# Patient Record
Sex: Male | Born: 1940 | Race: White | Hispanic: No
Health system: Northeastern US, Academic
[De-identification: ages and names within clinical notes are randomized; demographics above are authoritative.]

---

## 2015-05-13 ENCOUNTER — Ambulatory Visit: Admitting: Internal Medicine

## 2015-07-21 ENCOUNTER — Ambulatory Visit: Admitting: Internal Medicine

## 2015-07-21 ENCOUNTER — Ambulatory Visit

## 2015-07-21 NOTE — Progress Notes (Signed)
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)  HYPERLIPIDEMIA (E78.5)  HYPOTHYROIDISM (E03.9)  TESTOSTERONE DEFICIENCY (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.1)  ERECTILE DYSFUNCTION (N52.9)  GOUT (M10.9)  GERD (K21.9)  ANXIETY (F41.9)  VITILIGO-HANDS (L80)  INSOMNIA, CHRONIC (F51.04)  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)  DERMATITIS, 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)  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)  DEGENERATIVE JOINT DISEASE, KNEES, 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)  MENISCUS TEARS-RT KNEE (S83.209)  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)    Current Medications- Reviewed during today's visit  SYMBICORT 160-4.5 MCG/ACT  AERO (BUDESONIDE-FORMOTEROL FUMARATE): two puffs twice a day for asthma rinse afterwards  SYNTHROID 100 MCG TABS (LEVOTHYROXINE SODIUM): Take one po once daily  NEW HIGHER DOSE  PRAVASTATIN SODIUM 40 MG TABS: Take one po q hs Recheck chol in 2 months  COMPAIR NEBULIZER   MISC (NEBULIZERS): Use with albuterol nebs four times daily  IPRATROPIUM-ALBUTEROL 0.5-2.5 (3) MG/3ML SOLN: use in neb machine four times daily  Asthma  Dx J45.909  CELEBREX 200 MG CAPS (CELECOXIB): Take one po daily DO NOT TAKE IF TAKING INDOCIN  FLUTICASONE PROPIONATE 50 MCG/ACT SUSP: 2 sprays each nostril daily for nasal symptoms  VENTOLIN HFA 108 (90 BASE) MCG/ACT AERS (ALBUTEROL SULFATE): Take two whiffs q 4 hrs as needed for wheezing  ASPIRIN 81 MG TBEC: take one by mouth daily  MIRTAZAPINE 15 MG TABS: take one po at hs as needed for insomnia  LORAZEPAM 1 MG TABS: Take one po two times daily as needed for  anxiety No cash fill  CENTRUM SILVER  TABS (MULTIPLE VITAMINS-MINERALS): Take one po qd  SPIRIVA HANDIHALER 18 MCG CAPS (TIOTROPIUM BROMIDE MONOHYDRATE): 1 cap inhaled once daily for breathing  AZITHROMYCIN 500 MG  TABS: one by mouth daily for ten days for infection  PREDNISONE 20 MG TABS: Take one po two times daily with food    Past Medical History  remotely in three different detox  colonoscopy-2005-diverticulosis and hemorrhoids  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  Surgical History  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  appendectomy age 41  s/p T and A age 64 and age 59  Family History  Father:  died age 62, cancer (oral) and cancer of colon, alcoholic  Mother:  died age 23, cancer of breast, alcoholic  Siblings:  one sister COPD, alcoholic  Social History  Marital Status:  divorced  Children: one son age 6  Lives With: girlfriend [name]  Occupation: retired,   former Company secretary --retired age 48     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 Height: 70.6  in.    BMI: 35.67  BSA:    2    Wt chg:    4  Weight: 248.13 lbs   BMI: 35.13 on 01/22/2015  Temperature: 97.7 deg F.   Pulse rate: 74    Respirations: 12  Pulse Ox (SpO2): 95  BP: 112/70      Medications and Allergies Reviewed          Physical Exam    General:      Weight stable - no insomnia - fevers or night sweats  Head:      normocephalic and atraumatic.    Eyes:      PERRL/EOM intact, conjunctiva and sclera clear with out nystagmus.  wears glasses  varicose vein on the right orbit   some  firmness  cataracts OU--removed by Eye MD   at [facility] doc in [place] also    borderline eye pressures  Ears:      TM's intact and clear with normal canals with grossly normal hearing.    Nose:      no deformity, discharge, inflammation, or lesions.    Mouth:      post nasal drip.      no dental issues--false teeth  Neck:      no masses, thyromegaly, or abnormal cervical nodes.    Chest Wall:      no deformities or breast masses noted.    Lungs:      COPD  barrel chest  poor air movementwheezes on L and wheezes on R.    Heart:      non-displaced PMI, chest non-tender; regular rate and rhythm, S1, S2 without murmurs, rubs, or gallops  Abdomen:       normal bowel sounds; no hepatosplenomegaly no ventral,umbilical hernias or masses noted.    Msk:      generalized OA  Pulses:      pulses normal in all 4 extremities.    Extremities:      bilateral OA in knees with stiffness and crepitus  Neurologic:      no focal deficits, cranial nerves II-XII grossly intact with normal sensation, reflexes, coordination, muscle strength and tone.           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   CXR done in [place] but no access    ..patient started on high dose of prednisone x 10 days...then taper  restarted on azithromycin but 500mg m po once daily x 10 days  may need office followup       New/Revised  Medications Today:   SPIRIVA HANDIHALER 18 MCG CAPS (TIOTROPIUM BROMIDE MONOHYDRATE) 1 cap inhaled once daily for breathing  AZITHROMYCIN 500 MG  TABS (AZITHROMYCIN) one by mouth daily for ten days for infection  PREDNISONE 20 MG TABS (PREDNISONE) Take one po two times daily with food          Prescriptions:  PREDNISONE 20 MG TABS (PREDNISONE)  Take one po  two times daily with food  #20[Tablet] x 1   Entered and Authorized by: [name]   Signed by: [name] on 07/21/2015   Method used: Electronically to      [id number]* (retail)     [address]     [place], [place]  [address]     Ph: [phone]     Fax: [phone]   RxID: [id number]  AZITHROMYCIN 500 MG  TABS (AZITHROMYCIN) one by mouth daily for ten days for infection  #10[Tablet] x 1   Entered and Authorized by: [name]   Signed by: [name] on 07/21/2015   Method used: Electronically to      [id number]* (retail)     [address]     [place], [place]  [address]     Ph: [phone]     Fax: [phone]   RxID: [id number]  MIRTAZAPINE 15 MG TABS (MIRTAZAPINE) take one po at hs as needed for insomnia  #90 x 3   Entered and Authorized by: [name]   Signed by: [name] on 07/21/2015   Method used: Electronically to      [facility]* (mail-order)     [address] [place], [place]  [address]     Ph: [phone]     Fax: [phone]   RxID: [id number]  CELEBREX 200 MG CAPS (CELECOXIB) Take one po daily DO NOT TAKE IF TAKING INDOCIN  #90 x 3   Entered and Authorized by: [name]   Signed by: [name] on 07/21/2015   Method used: Electronically to      [facility]* (mail-order)     [address] [place], [place]  [address]     Ph: [phone]     Fax: [phone]   RxID: [id number]  SPIRIVA HANDIHALER 18 MCG CAPS (TIOTROPIUM BROMIDE MONOHYDRATE) 1 cap inhaled once daily for breathing  #90 x 3   Entered and Authorized by: [name]   Signed by: [name] on 07/21/2015   Method used: Electronically to      [facility]* (mail-order)     [address] [place], [place]  [address]     Ph: [phone]     Fax: [phone]   RxID: [id number]

## 2015-07-21 NOTE — Progress Notes (Signed)
Clinical Lists Changes    Medications:  Changed medication from SPIRIVA HANDIHALER 18 MCG CAPS (TIOTROPIUM BROMIDE MONOHYDRATE) 1 cap inhaled once daily for breathing to INCRUSE ELLIPTA 62.5 MCG/INH AEPB (UMECLIDINIUM BROMIDE) one inhalation once daily for Lungs (COPD) - Signed  Rx of INCRUSE ELLIPTA 62.5 MCG/INH AEPB (UMECLIDINIUM BROMIDE) one inhalation once daily for Lungs (COPD);  #3[Inhaler] x 3;  Signed;  Entered by: [name];  Authorized by: [name];  Method used: Electronically to [facility] [address] [place], [place], [place]  [address], Ph: [phone], Fax: [phone]

## 2015-09-08 ENCOUNTER — Ambulatory Visit

## 2015-09-14 ENCOUNTER — Ambulatory Visit

## 2015-09-21 ENCOUNTER — Ambulatory Visit: Admitting: Internal Medicine

## 2015-10-14 ENCOUNTER — Ambulatory Visit: Admitting: Internal Medicine

## 2015-11-16 ENCOUNTER — Ambulatory Visit: Admitting: Internal Medicine

## 2015-11-25 ENCOUNTER — Ambulatory Visit

## 2016-01-07 ENCOUNTER — Ambulatory Visit: Admitting: Internal Medicine

## 2016-01-13 ENCOUNTER — Ambulatory Visit

## 2016-01-20 ENCOUNTER — Ambulatory Visit

## 2016-01-20 ENCOUNTER — Ambulatory Visit: Admitting: Internal Medicine

## 2016-01-20 LAB — HX COMPREHENSIVE METABOLIC PANELX
HX ALBUMIN: 4.2 g/dL (ref 3.2–4.9)
HX ALKALINE PHOSPHATASE: 78 U/L (ref 30.0–117.0)
HX ALT: 30 U/L (ref 0.0–40.0)
HX ANION GAP: 13 mmol/L (ref 9.0–19.0)
HX AST: 27 U/L (ref 0.0–37.0)
HX BICARBONATE: 27 mmol/L (ref 23.0–31.0)
HX BUN/CREAT RATIO: 14 (ref 12.0–20.0)
HX BUN: 15 mg/dL (ref 8.0–23.0)
HX CALCIUM: 9.2 mg/dL (ref 8.5–10.5)
HX CHLORIDE: 102 mmol/L (ref 98.0–110.0)
HX CREATININE: 1 mg/dL (ref 0.4–1.2)
HX GLOMERULAR FILTRATION RATE: 73
HX GLUCOSE: 94 mg/dL (ref 70.0–100.0)
HX HEMOLYSIS INDEX: 13 mg/dL (ref 0.0–50.0)
HX ICTERIC INDEX: 1 (ref 0.0–2.0)
HX LIPEMIC INDEX: 9 mg/dL (ref 0.0–40.0)
HX POTASSIUM: 4.5 mmol/L (ref 3.6–5.3)
HX SODIUM: 142 mmol/L (ref 137.0–146.0)
HX TOTAL BILIRUBIN: 0.6 mg/dL (ref 0.2–1.2)
HX TOTAL PROTEIN: 6.8 g/dL (ref 6.5–8.4)

## 2016-01-20 LAB — HX CBC W/DIFFX
HX ABSOLUTE BASOPHILS COUNT: 0.03 10*3/uL (ref 0.0–0.33)
HX ABSOLUTE EOSINOPHIL COUNT: 0.63 10*3/uL (ref 0.0–0.88)
HX ABSOLUTE LYMPHS COUNT: 1.64 10*3/uL (ref 0.99–4.84)
HX ABSOLUTE MONOCYTES COUNT: 0.59 10*3/uL (ref 0.18–1.21)
HX ABSOLUTE NEUTROPHIL CNT: 3.15 10*3/uL (ref 1.8–7.7)
HX BASOS: 0.5 %
HX EOS: 10.4 %
HX HEMATOCRIT: 43 % (ref 41.0–53.0)
HX HEMOGLOBIN: 14.6 g/dL (ref 13.5–17.5)
HX IMMATURE GRANULOCYTES: 0.2 % (ref 0.0–1.0)
HX LYMPHS: 27.1 %
HX MEAN CORP.HEMO.CONC.: 34 g/dL (ref 31.0–37.0)
HX MEAN CORPUSCULAR HEMOGLOBIN: 29.8 pg (ref 26.0–34.0)
HX MEAN CORPUSCULAR VOLUME: 87.8 fL (ref 80.0–100.0)
HX MEAN PLATELET VOLUME: 10.7 fL (ref 9.4–12.4)
HX MONOS: 9.8 %
HX PLATELET COUNT: 167 10*3/uL (ref 150.0–400.0)
HX POLYS: 52
HX RED BLOOD COUNT: 4.9 M/uL (ref 4.5–5.9)
HX RED CELL DISTRIBUTION WIDTH SD: 41.7 fL (ref 35.0–51.0)
HX WHITE BLOOD COUNT: 6.1 10*3/uL (ref 4.5–11.0)

## 2016-01-20 LAB — HX ROUT.URINE WITH MICROSCOPIC
HX ASCORBIC ACID URINE: NEGATIVE
HX URINE BACTERIA: NONE SEEN
HX URINE BILE: NEGATIVE
HX URINE BLOOD: NEGATIVE
HX URINE ESTERASE: NEGATIVE
HX URINE GLUCOSE: NEGATIVE
HX URINE KETONES: NEGATIVE
HX URINE NITRITE: NEGATIVE
HX URINE PH: 5 (ref 5.0–8.0)
HX URINE PROTEIN: NEGATIVE
HX URINE SPECIFIC GRAVITY: 1.013 (ref 1.003–1.03)
HX URINE SQUAMOUS EPITHELIALX: NONE SEEN
HX UROBILINOGEN, URINE: NEGATIVE

## 2016-01-20 LAB — HX GLYCOHEMOGLOBIN
HX ESTIMATED AVERAGE GLUCOSE: 117 mg/dL
HX GLYCOHEMOGLOBIN EQUIVALENT: 0.672
HX HEMOGLOBIN A1C: 5.7 % (ref 4.2–5.8)

## 2016-01-20 LAB — HX VITAMIN D,25 HYDROXY: HX VITAMIN D,25 HYDROXY: 42.6 ng/mL (ref >=30)

## 2016-01-20 LAB — HX MICROALBUMIN (RANDOM URINE)
HX MICROALBUMIN (RANDOM URINE): <12 (ref 0.0–20.0)
HX URINE CREATININE (RANDOM): 77.9 mg/dL (ref 22.0–328.0)

## 2016-01-20 LAB — HX LIPID PANEL FASTINGX
HX CHD RISK ASSESMENT FACTORX: 3.3
HX CHOLESTEROL (LIPR): 169 mg/dL (ref <200)
HX HDL CHOLESTEROLX: 51 mg/dL (ref 35.0–55.0)
HX LDL CHOLESTEROLX: 100 mg/dL (ref <130)
HX NON HDL CHOLESTEROLX: 118 mg/dL (ref <130)
HX TRIGLYCERIDES: 91 mg/dL (ref <150)

## 2016-01-20 LAB — HX FERRITIN: HX FERRITIN: 56 ng/mL (ref 30.0–400.0)

## 2016-01-20 LAB — HX VITAMIN B12
HX HEMOLYSIS INDEX: 14 mg/dL (ref 0.0–50.0)
HX ICTERIC INDEX: 1 (ref 0.0–2.0)
HX LIPEMIC INDEX: 11 mg/dL (ref 0.0–40.0)
HX VITAMIN B12: 305 pg/mL (ref 240.0–900.0)

## 2016-01-20 LAB — HX PROSTATE SPECIFIC ANTIGEN SCR: HX PROSTATE SPECIFIC ANTIGEN SCR: 0.74 ng/mL (ref <4.00)

## 2016-01-20 LAB — HX TSH WITH REFLEX: HX TSH WITH REFLEX: 2.49 u[IU]/mL (ref 0.27–4.2)

## 2016-01-20 LAB — HX DIRECT BILIRUBIN: HX DIRECT BILIRUBIN: <0.2 (ref 0.0–0.3)

## 2016-01-20 NOTE — Progress Notes (Signed)
Medications Prior to this Visit  SYMBICORT 160-4.5 MCG/ACT INHALATION AEROSOL (BUDESONIDE-FORMOTEROL FUMARATE) two puffs twice a day for asthma rinse afterwards  SYNTHROID 100 MCG ORAL TABLET (LEVOTHYROXINE SODIUM) Take one po once daily  NEW HIGHER DOSE  PRAVASTATIN SODIUM 40 MG ORAL TABLET (PRAVASTATIN SODIUM) Take one po q hs Recheck chol in 2 months  COMPAIR NEBULIZER (NEBULIZERS) Use with albuterol nebs four times daily  IPRATROPIUM-ALBUTEROL 0.5-2.5 (3) MG/3ML INHALATION SOLUTION (IPRATROPIUM-ALBUTEROL) use in neb machine four times daily  Asthma  Dx J45.909  CELEBREX 200 MG ORAL CAPSULE (CELECOXIB) Take one po daily DO NOT TAKE IF TAKING INDOCIN  FLUTICASONE PROPIONATE 50 MCG/ACT NASAL SUSPENSION (FLUTICASONE PROPIONATE) 2 sprays each nostril daily for nasal symptoms  VENTOLIN HFA 108 (90 Base) MCG/ACT INHALATION AEROSOL SOLUTI (ALBUTEROL SULFATE) Take two whiffs q 4 hrs as needed for wheezing  ASPIRIN 81 MG ORAL TABLET DELAYED RELEASE (ASPIRIN) take one by mouth daily  MIRTAZAPINE 15 MG ORAL TABLET (MIRTAZAPINE) take one po at hs as needed for insomnia  LORAZEPAM 1 MG ORAL TABLET (LORAZEPAM) Take one po two times daily as needed for anxiety No cash fill  CENTRUM SILVER ORAL TABLET (MULTIPLE VITAMINS-MINERALS) Take one po qd  INCRUSE ELLIPTA 62.5 MCG/INH INHALATION AEROSOL POWDER BREAT (UMECLIDINIUM BROMIDE) one inhalation once daily for Lungs (COPD)  PREDNISONE 20 MG ORAL TABLET (PREDNISONE) Take one po two times daily with food    Current Problems- Reviewed during today's visit  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)  COLONIC POLYPS (No ICD-10)  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)  ASTHMA, 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)  CARCINOMA, 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)  ALCOHOL ABUSE, 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)  HYPERLIPIDEMIA (E78.5)  HYPOTHYROIDISM (E03.9)  TESTOSTERONE DEFICIENCY (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.1)  ERECTILE DYSFUNCTION (N52.9)  GOUT (M10.9)  GERD (K21.9)  ANXIETY (F41.9)  VITILIGO-HANDS (L80)  INSOMNIA, CHRONIC (F51.04)  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)  DERMATITIS,  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)  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)  DEGENERATIVE JOINT DISEASE, KNEES, 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)  MENISCUS TEARS-RT KNEE (S83.209)  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)    Current Medications- Reviewed during today's visit  SYMBICORT 160-4.5 MCG/ACT INHALATION AEROSOL (BUDESONIDE-FORMOTEROL FUMARATE): two puffs twice a day for asthma rinse afterwards  SYNTHROID 100 MCG ORAL TABLET (LEVOTHYROXINE SODIUM): Take one po once daily  NEW HIGHER DOSE  PRAVASTATIN SODIUM 40 MG ORAL TABLET: Take one po q hs Recheck chol in 2 months  COMPAIR NEBULIZER (NEBULIZERS): Use with albuterol nebs four times daily  IPRATROPIUM-ALBUTEROL 0.5-2.5 (3) MG/3ML INHALATION SOLUTION: use in neb machine four times daily  Asthma  Dx J45.909  CELEBREX 200 MG ORAL CAPSULE (CELECOXIB): Take one po daily DO NOT TAKE IF TAKING INDOCIN  FLUTICASONE PROPIONATE 50 MCG/ACT NASAL SUSPENSION: 2 sprays each nostril daily for nasal symptoms  VENTOLIN HFA 108 (90 Base) MCG/ACT INHALATION AEROSOL SOLUTI (ALBUTEROL SULFATE): Take two whiffs q 4 hrs as needed for wheezing  ASPIRIN 81 MG ORAL TABLET DELAYED RELEASE: take one by mouth daily  MIRTAZAPINE 15 MG ORAL TABLET: take one po at hs as needed for insomnia  LORAZEPAM 1 MG ORAL TABLET: Take one po two times daily as needed for anxiety No cash fill  CENTRUM SILVER ORAL TABLET (MULTIPLE VITAMINS-MINERALS): Take one po qd  INCRUSE ELLIPTA 62.5 MCG/INH INHALATION AEROSOL POWDER BREAT (UMECLIDINIUM BROMIDE): one inhalation once daily for Lungs (COPD)  PREDNISONE 20 MG ORAL TABLET: Take one po two times daily with food    Current Directives- Reviewed during today's visit  HEALTH CARE PROXY  DISCUSED WITH PATIENT -- FULL CODE    Past Medical History  remotely in three  different detox  colonoscopy-2005-diverticulosis and hemorrhoids  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  Surgical History  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  appendectomy age 68  s/p T and A age 48 and age 28  Family  History  Father:  died age 59, cancer (oral) and cancer of colon, alcoholic  Mother:  died age 26, cancer of breast, alcoholic  Siblings:  one sister COPD, alcoholic  Social History  Marital Status:  divorced  Children: one son age 90  Lives With: girlfriend [name]  Occupation: retired,   former Company secretary --retired age 21     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   Eyes: Denies visual change or blurring, eye pain.   Ears/Nose/Throat: Denies earache, decreased hearing, difficulty swallowing.   Cardiovascular: Denies chest pain or pressure, palpitations, shortness of breath.   Respiratory: Denies dry cough, productive cough, shortness of breath, wheezing.   Gastrointestinal: Denies acid indigestion, nausea, vomiting, diarrhea, abdominal pain, change in bowel habits, constipation, mucous or blood in stools.   Musculoskeletal: Denies muscle cramps or aches, muscle weakness, morning stiffness, joint pain, joint swelling.   Skin: Denies dry skin, rash, skin ulcers, suspicious lesions, hx of skin cancer.   Psychiatric: Denies anxiety, depression, insomnia.     Vital Signs     Weight: 258 lb. Height: 70.6  in.    BMI: 36.52  BSA: 2.34    Wt chg: 6  Weight: 252 lbs   BMI: 35.67 on 07/21/2015  Temperature: 97.7 deg F.   Pulse rate: 62    Respirations: 12  Pulse Ox (SpO2): 97  BP: 126/80      Medications and Allergies Reviewed          Physical Exam    General:      Weight stable - no insomnia -  fevers or night sweats  Head:      normocephalic and atraumatic.    Eyes:      PERRL/EOM intact, conjunctiva and sclera clear with out nystagmus.  wears glasses  varicose vein on the right orbit   some firmness  cataracts OU--removed by Eye MD   at [facility] doc in [place] also    borderline eye pressures  Ears:      TM's intact and clear with normal canals with grossly normal hearing.    Nose:      no deformity, discharge, inflammation, or lesions.    Mouth:      post nasal drip.      no dental issues--false teeth  Neck:      no masses, thyromegaly, or abnormal cervical nodes.    Chest Wall:      no deformities or breast masses noted.    Lungs:      COPD  barrel chest  Heart:      non-displaced PMI, chest non-tender; regular rate and rhythm, S1, S2 without murmurs, rubs, or gallops  Abdomen:       normal bowel sounds; no hepatosplenomegaly no ventral,umbilical hernias or masses noted.    Prostate:      PSA screen  Msk:      generalized OA  Pulses:      pulses normal in all 4 extremities.    Extremities:      bilateral OA in knees with stiffness and crepitus  Neurologic:      no focal deficits, cranial nerves II-XII grossly intact with normal sensation, reflexes, coordination, muscle strength and tone.           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   test TSH     No over the counter medications.   Med Compliance and SE's: Pt is compliant with meds with no side effects               Prescriptions:  SYNTHROID 100 MCG ORAL TABLET (LEVOTHYROXINE SODIUM) Take one po once daily  NEW HIGHER DOSE  #90[Unspecified] x 3   Entered and Authorized by: [name]  MD   Signed by: [name] on 01/20/2016   Method used: Electronically to      [facility]* (mail-order)     [address] [place], [place]  [address]     Ph: [phone]     Fax: [phone]   RxID: [id number]

## 2016-01-20 NOTE — Progress Notes (Signed)
Receipt of: Gen Update    The following were sent to Orland Penman' at [email or web address] on 01/20/2016 4:06:16 PM:     - Secure message created from ACM template     - Attachment created from ACM template

## 2016-02-16 ENCOUNTER — Ambulatory Visit

## 2016-04-18 ENCOUNTER — Ambulatory Visit

## 2016-04-27 ENCOUNTER — Ambulatory Visit

## 2016-05-10 ENCOUNTER — Ambulatory Visit

## 2016-05-25 ENCOUNTER — Ambulatory Visit

## 2016-05-25 NOTE — Progress Notes (Signed)
Problem List Changes:  Added new problem of MORBID OBESITY (ICD-278.01) (ICD10-E66.01)  Added new problem of BMI 36-36.9 ADULT (ICD-V85.36) (ZOX09-U04.54)    By signing my name below, I, Rich Number, attest that this documentation has been prepared under the direction and in the presence of Sarrinah Ahmed.   Electronically Signed: (May 25, 2016 10:17 AM)

## 2016-07-14 ENCOUNTER — Ambulatory Visit

## 2016-07-14 ENCOUNTER — Ambulatory Visit: Admitting: Internal Medicine

## 2016-07-14 LAB — HX GLYCOHEMOGLOBIN
HX ESTIMATED AVERAGE GLUCOSE: 123 mg/dL
HX GLYCOHEMOGLOBIN EQUIVALENT: 0.684
HX HEMOGLOBIN A1C: 5.9 % — ABNORMAL HIGH (ref 4.2–5.8)

## 2016-07-14 LAB — HX COMPREHENSIVE METABOLIC PANELX
HX ALBUMIN: 4.3 g/dL (ref 3.2–4.9)
HX ALKALINE PHOSPHATASE: 77 U/L (ref 30.0–117.0)
HX ALT: 25 U/L (ref 0.0–40.0)
HX ANION GAP: 15 mmol/L (ref 9.0–19.0)
HX AST: 26 U/L (ref 0.0–37.0)
HX BICARBONATE: 25 mmol/L (ref 23.0–31.0)
HX BUN/CREAT RATIO: 16 (ref 12.0–20.0)
HX BUN: 16 mg/dL (ref 8.0–23.0)
HX CALCIUM: 9.1 mg/dL (ref 8.5–10.5)
HX CHLORIDE: 101 mmol/L (ref 98.0–110.0)
HX CREATININE: 1 mg/dL (ref 0.4–1.2)
HX GLOMERULAR FILTRATION RATE: 73
HX GLUCOSE: 75 mg/dL (ref 70.0–100.0)
HX HEMOLYSIS INDEX: 6 mg/dL (ref 0.0–50.0)
HX ICTERIC INDEX: 1 (ref 0.0–2.0)
HX LIPEMIC INDEX: 7 mg/dL (ref 0.0–40.0)
HX POTASSIUM: 4.5 mmol/L (ref 3.5–5.1)
HX SODIUM: 141 mmol/L (ref 137.0–146.0)
HX TOTAL BILIRUBIN: 0.6 mg/dL (ref 0.2–1.2)
HX TOTAL PROTEIN: 6.4 g/dL — ABNORMAL LOW (ref 6.5–8.4)

## 2016-07-14 LAB — HX TSH WITH REFLEX: HX TSH WITH REFLEX: 2.77 u[IU]/mL (ref 0.27–4.2)

## 2016-07-14 LAB — HX  COMPLETE BLOOD COUNT
HX HEMATOCRIT: 43.1 % (ref 41.0–53.0)
HX HEMOGLOBIN: 14.2 g/dL (ref 13.5–17.5)
HX MEAN CORP.HEMO.CONC.: 32.9 g/dL (ref 31.0–37.0)
HX MEAN CORPUSCULAR HEMOGLOBIN: 29.3 pg (ref 26.0–34.0)
HX MEAN CORPUSCULAR VOLUME: 88.9 fL (ref 80.0–100.0)
HX MEAN PLATELET VOLUME: 11.4 fL (ref 9.4–12.4)
HX PLATELET COUNT: 178 10*3/uL (ref 150.0–400.0)
HX RED BLOOD COUNT: 4.9 M/uL (ref 4.5–5.9)
HX RED CELL DISTRIBUTION WIDTH SD: 42.5 fL (ref 35.0–51.0)
HX WHITE BLOOD COUNT: 6.1 10*3/uL (ref 4.5–11.0)

## 2016-07-14 NOTE — Progress Notes (Signed)
Visit Type:  Acute Visit  Primary Provider:  [name]    CC:  Fatigue and SOB.    History of Present Illness:  Justin Navarro is a 76 Year Old Male who presents today for: generalized fatigue  Specialists seen since last visit? no  Has previsit planning and a huddle been performed on this patient? no     No tobacco product use     WEAKNESS/FATIGUE:  -Returned from [place] 6-7 weeks ago  -Since has been feeling weak and fatigued  -He is sleeping well.      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  ALCOHOL DEPENDENCE IN REMISSION  HYPERLIPIDEMIA  HYPOTHYROIDISM  TESTOSTERONE DEFICIENCY  ERECTILE DYSFUNCTION  GOUT  GERD  ANXIETY  VITILIGO-HANDS  COLON CANCER-FATHER  LUMBAR RADICULOPATHY    Current Medications- Reviewed during today's visit  ASPIRIN 81 MG ORAL TABLET DELAYED RELEASE: take one by mouth daily  SYMBICORT 160-4.5 MCG/ACT INHALATION AEROSOL (BUDESONIDE-FORMOTEROL FUMARATE): two puffs twice a day for asthma rinse afterwards  SYNTHROID 100 MCG ORAL TABLET (LEVOTHYROXINE SODIUM): Take one po once daily  NEW HIGHER DOSE  PRAVASTATIN SODIUM 40 MG ORAL TABLET: Take one po q hs Recheck chol in 2 months  COMPAIR NEBULIZER (NEBULIZERS): Use with albuterol nebs four times daily  IPRATROPIUM-ALBUTEROL 0.5-2.5 (3) MG/3ML INHALATION SOLUTION: use in neb machine four times daily  Asthma  Dx J45.909  CELEBREX 200 MG ORAL CAPSULE (CELECOXIB): Take one po daily DO NOT TAKE IF TAKING INDOCIN  FLUTICASONE PROPIONATE 50 MCG/ACT NASAL SUSPENSION: 2 sprays each nostril daily for nasal symptoms  VENTOLIN HFA 108 (90 Base) MCG/ACT INHALATION AEROSOL SOLUTI (ALBUTEROL SULFATE): Take two whiffs q 4 hrs as needed for wheezing  MIRTAZAPINE 15 MG ORAL TABLET: take one po at  hs as needed for insomnia  LORAZEPAM 1 MG ORAL TABLET: Take one po two times daily as needed for anxiety No cash fill  PREDNISONE 20 MG ORAL TABLET: Take one po two times daily with food  SPIRIVA HANDIHALER 18 MCG INHALATION CAPSULE (TIOTROPIUM BROMIDE MONOHYDRATE): 1 inhalation daily for copd    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  Surgical History  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  appendectomy age 89  s/p T and A age 3 and age 89  Family History  Father:  died age 65, cancer (oral) and cancer of colon, alcoholic  Mother:  died age 16, cancer of breast, alcoholic  Siblings:  one sister COPD, alcoholic  Social History  Marital Status:  divorced  Children: one son age 29  Lives With: girlfriend [name]  Occupation: retired,   former Company secretary --retired age 46     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      Risk Factors  Tobacco User:no  Smoking Status:former smoker  Year quit: 1998  Pack-years: 40  Smoking Comments:  started smoking age [age]  Passive smoke exposure: No    Drug use: no  Alcohol use: no    Exercise: Yes  Times per week: 3  Exercise Comments:  walking and golf    Caffeine (drinks/day): 2  Sun exposure: rarely  Seatbelt use (%): 100  Family History MI in male age < 40: no  Family History MI in male age < 52: no    Falls Information:  Past year - no        Review of Systems   General: Complains of fatigue, weakness.   Cardiovascular: Complains of shortness of breath.   Respiratory: Complains of shortness of breath.   Neurologic: Complains of general weakness.     Vital Signs     Weight: 254 lb. Height: 70.6  in.    BMI: 35.96  BSA:    2    Wt chg: -4  Weight: 258 lbs   BMI: 36.52 on 01/20/2016  Temperature: 97.7 deg F.     Temp Site: oral    Pulse rate:  67  On Oxygen? No  Pulse Ox (SpO2): 94 BP: 126/78 - normal cuff, right arm      Patient is not experiencing pain    Medications and Allergies Reviewed    Signed: Ardeen Fillers [place]....July 14, 2016 5:19 PM  PHQ 2    Over the last 2 weeks, how often have you been bothered by any of the following problems?  1. Little interest or pleasure in doing things:  1   - Several days  2. Feeling down, depressed, or hopeless:  0   - Not at all        Physical Exam    General:      well developed, well nourished, in no acute distress.    Head:      normocephalic and atraumatic.    Eyes:      PERRL/EOM intact, conjunctiva and sclera clear with out nystagmus.    Ears:      TM's intact and clear with normal canals with grossly normal hearing.    Lungs:      diffuse exp wheezing bilat without rhonchi or rales. fair air movement.   Heart:      non-displaced PMI, chest non-tender; regular rate and rhythm, S1, S2 without murmurs, rubs, or gallops  Pulses:      pulses normal in all 4 extremities.    Extremities:      No edema      Orders:   Added new Service order of Patient Encounter ([id number]) - Signed  Added new Test order of LABS DRAWN/SPECIMEN COLLECTED IN OFFICE [facility]) - Signed  Added new Test order of CBC -CBC Only (No Diff)** (CBCO) - Signed  Added new Test order of COMMP -Comp. Metabolic Panel (CMP) - Signed  Added new Test order of TSHR -TSHR with Reflex** (TSHR) - Signed  Added new Test order of GLYCO - A1C** (GLYCO.) - Signed  Added new Test order of Chest P/A & Lateral (CHEST) - Signed  Added new Test order of PFT-Pulmonary Function Test (PFT) - Signed  Added new Service order of OV Est Level IV ([id number]) - Signed  Added new Service order of Venipuncture ([id number]) - Signed       Assessment and Plan:      ~ COPD-MOD/asthma : I think this is the major cause of his symptoms. He has not been using the incruse because he thought it ineffective. he is using the symbicort. will add spiriva. will get cxr to rule out heart  failure and pneumonia.   Will send in order for lung function testing  and he will follow up in one month.      ~ FATIGUE, WEAKNESS:   Will check labs to rule out anemia, underactive thyroid, etc.          ~ MORBID OBESITY (E66.01)   BMI 36-36.9 ADULT (Z68.36) with comorbidities of Osteoarthritis and hyperlipidemia.   he wants to lose weight and plans to work on diet. he will return for nutrition counseling with our NP Clydie Braun.    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       Medications Removed Today:   CENTRUM SILVER ORAL TABLET (MULTIPLE VITAMINS-MINERALS) Take one po qd  INCRUSE ELLIPTA 62.5 MCG/INH INHALATION AEROSOL POWDER BREAT (UMECLIDINIUM BROMIDE) one inhalation once daily for Lungs (COPD)    New/Revised  Medications Today:   SPIRIVA HANDIHALER 18 MCG INHALATION CAPSULE (TIOTROPIUM BROMIDE MONOHYDRATE) 1 inhalation daily for copd            Patient Instructions    I am going to check you lab work today.  *  I have ordered a chest x-ray for you as well as a lung function test.   *  Please start Spiriva daily in addition to the Symbicort  *  Follow up as scheduled with Dr. Dagoberto Reef.        Prescriptions:  SPIRIVA HANDIHALER 18 MCG INHALATION CAPSULE (TIOTROPIUM BROMIDE MONOHYDRATE) 1 inhalation daily for copd  #90[Capsule] x 3   Entered and Authorized by: Caffie Damme, M.D.   Signed by: Caffie Damme, M.D. on 07/14/2016   Method used: Electronically to      [facility] ([facility]order)     [address] [place], [place]  [address]     Ph: [phone]     Fax: [phone]   RxID: [id number]            By signing my name below, I, Briana C. Lass, attest that this documentation has been prepared under the direction and in the presence of Caffie Damme, M.D..   Electronically Signed: (July 14, 2016 5:31 PM)  I entered the room at this time .Marland KitchenMarland KitchenBriana C. Lass  July 14, 2016 5:30 PM.  I exited the room at this time .Marland KitchenMarland KitchenBriana C. Lass  July 14, 2016 5:40 PM.  Patient  Portal Chart Access PIN     The included PIN will be valid until August 13, 2016  Your PIN ID: [id number]    Patient was provided instructions via a Printed Handout.  GeorgeBryer)  - PIN:([id number])    Contact us if you have any questions. We look forward to connecting with you online through the secure Patient Portal.    Sincerely,    [facility] - [address]

## 2016-07-15 ENCOUNTER — Ambulatory Visit: Admitting: Internal Medicine

## 2016-07-15 ENCOUNTER — Ambulatory Visit

## 2016-07-15 NOTE — Progress Notes (Signed)
[facility] - [address]   [address]   [place], [place] [address]  Office: [phone] Fax: [phone]     Mathius Birkeland (05-11-1940)       Printed: July 15, 2016    July 15, 2016      Sven Pinheiro  [address]  [place], [place]  [address]    Dear [name],    I have received the results of your most recent labwork. The results are listed below:     Labs Your Value Normal Result Date   Blood sugar 75 Normal : 70-100 07/14/2016   Hemoglobin A1C   (3 month sugar test) 5.9 Normal: 4.2  5.8 07/14/2016   Estimated Average Glucose  (3 month Average) 123 Goal: less than 150 07/14/2016   Creatinine (kidney function) 1.0 Normal: 0.4  1.2   07/14/2016   ALT (liver test) 25 Normal male:0-40   07/14/2016   AST (liver test) 26 Normal male: 0-37 07/14/2016   Hematocrit (blood count)   43.1 Normal male: 40-52 07/14/2016   TSH (ultra thyroid test) 2.77 Normal: 0.27  4.20 07/14/2016         Sincerely,  Caffie Damme, M.D.

## 2016-07-15 NOTE — Telephone Encounter (Signed)
Phone Note -     Prior Authorization    Initial call taken by: Caffie Damme, M.D.,  July 15, 2016 2:33 PM  Summary of Call: Drug Name: spiriva  [facility]  Past drugs used or other pertinent information: patient as copd/asthma not controlled on symbicort. has tried incruse without benefit.   Provider:  [place]      Follow-up #1  Details: submitted  By: Theodora Blow ~ July 17, 2016 12:21 PM    Details: APPROVED  pharmacy / patient notified  By: Theodora Blow ~ July 17, 2016 12:56 PM

## 2016-07-15 NOTE — Telephone Encounter (Signed)
Phone Note -     Outgoing Call    Initial call taken by: Caffie Damme, M.D.,  July 15, 2016 9:38 AM  Summary of Call: last glycohemoglobin was 5.9 on 07/14/2016. this is consistent with prediabetes  he will return for nutrition teaching and follow up to copd with lynn in July.           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

## 2016-07-16 ENCOUNTER — Ambulatory Visit

## 2016-08-09 ENCOUNTER — Ambulatory Visit: Admitting: Internal Medicine

## 2016-08-09 ENCOUNTER — Ambulatory Visit

## 2016-08-11 ENCOUNTER — Ambulatory Visit: Admitting: Internal Medicine

## 2016-08-11 NOTE — Telephone Encounter (Signed)
Phone Note -     Outgoing Call    Initial call taken by: Caffie Damme, M.D.,  August 11, 2016 9:41 AM  Summary of Call: moderate copd on pfts. he can discuss further with Clydie Braun at his appointment.       Follow-up #1  Details: Call to patient- mailbox is full and cannot accept messages at this time.   By: Joannie Springs RN ~ August 11, 2016 9:55 AM    Details: Call from Pt- review Information from Dr Murlean Caller- verbally understood.  Will discuss further with L. Jumper at appt Monday   By: Joannie Springs RN ~ August 11, 2016 10:41 AM

## 2016-08-13 ENCOUNTER — Ambulatory Visit

## 2016-08-14 ENCOUNTER — Ambulatory Visit

## 2016-08-14 ENCOUNTER — Ambulatory Visit: Admitting: Adolescent Medicine

## 2016-08-14 LAB — HX COMPREHENSIVE METABOLIC PANELX
HX ALBUMIN: 4.1 g/dL (ref 3.2–4.9)
HX ALKALINE PHOSPHATASE: 78 U/L (ref 30.0–117.0)
HX ALT: 22 U/L (ref 0.0–40.0)
HX ANION GAP: 14 mmol/L (ref 9.0–19.0)
HX AST: 24 U/L (ref 0.0–37.0)
HX BICARBONATE: 22 mmol/L — ABNORMAL LOW (ref 23.0–31.0)
HX BUN/CREAT RATIO: 14 (ref 12.0–20.0)
HX BUN: 15 mg/dL (ref 8.0–23.0)
HX CALCIUM: 8.9 mg/dL (ref 8.5–10.5)
HX CHLORIDE: 104 mmol/L (ref 98.0–110.0)
HX CREATININE: 1.1 mg/dL (ref 0.4–1.2)
HX GLOMERULAR FILTRATION RATE: 65
HX GLUCOSE: 90 mg/dL (ref 70.0–100.0)
HX HEMOLYSIS INDEX: 20 mg/dL (ref 0.0–50.0)
HX ICTERIC INDEX: 1 (ref 0.0–2.0)
HX LIPEMIC INDEX: 14 mg/dL (ref 0.0–40.0)
HX POTASSIUM: 4.3 mmol/L (ref 3.5–5.1)
HX SODIUM: 140 mmol/L (ref 137.0–146.0)
HX TOTAL BILIRUBIN: 0.5 mg/dL (ref 0.2–1.2)
HX TOTAL PROTEIN: 6.6 g/dL (ref 6.5–8.4)

## 2016-08-14 NOTE — Progress Notes (Signed)
Primary Provider:  [name]      History of Present Illness:  Justin Navarro is a 76 Year Old Male who presents today ZOX:WRUEAVWUJWJ,XBJYNWGNFAO counseling/ teaching.He states he is a picky eater.Marland Kitchen"I only like sweets or soft foods". He states he eggs often for they are soft and a good source of protein. He has a h/o mouth cancer       Specialists seen since last visit? no  Has previsit planning and a huddle been performed on this patient? 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    Current Medications- Reviewed during today's visit  ASPIRIN 81 MG ORAL TABLET DELAYED RELEASE: take one by mouth daily  SYMBICORT 160-4.5 MCG/ACT INHALATION AEROSOL (BUDESONIDE-FORMOTEROL FUMARATE): two puffs twice a day for asthma rinse afterwards  SYNTHROID 100 MCG ORAL TABLET (LEVOTHYROXINE SODIUM): Take one po once daily  NEW HIGHER DOSE  PRAVASTATIN SODIUM 40 MG ORAL TABLET: Take one po q hs Recheck chol in 2 months  COMPAIR NEBULIZER (NEBULIZERS): Use with albuterol nebs four times daily  IPRATROPIUM-ALBUTEROL 0.5-2.5 (3) MG/3ML INHALATION SOLUTION: use in neb machine four times daily  Asthma  Dx J45.909  CELEBREX 200 MG ORAL CAPSULE (CELECOXIB): Take one po daily DO NOT TAKE IF TAKING INDOCIN  FLUTICASONE PROPIONATE 50 MCG/ACT NASAL SUSPENSION: 2 sprays each nostril daily for nasal symptoms  VENTOLIN HFA 108 (90 Base) MCG/ACT INHALATION AEROSOL SOLUTION (ALBUTEROL SULFATE): Take two whiffs q 4 hrs as needed for wheezing  MIRTAZAPINE 15 MG ORAL TABLET: take one po at hs as needed for insomnia  LORAZEPAM 1 MG ORAL TABLET: Take one po two times daily as needed for anxiety No cash  fill  PREDNISONE 20 MG ORAL TABLET: Take one po two times daily with food  SPIRIVA HANDIHALER 18 MCG INHALATION CAPSULE (TIOTROPIUM BROMIDE MONOHYDRATE): 1 inhalation daily for copd    Current Allergies- Reviewed during today's visit  PENICILLIN (Critical)  * CT CONTRAST (Critical)  * BELSOMRA (Mild)    Bladder Control  issues: no  Safety concerns: no    Falls Information:  Past 6 months - no  Past year - yes        ROS:  See HPI  All other pertinent systems reviewed and are negative.  Vital Signs     Patient: 77 Years Old Male  Height:  70.6 in.  Weight: 254 lbs        BP:  110/60 right arm, large cuff, seated     126/78 on 07/14/2016   Temp:  97.7  F    oral  Pulse:  63         Pulse Ox: 94 %  On Oxygen: No    Patient is not experiencing pain    Medications and Allergies Reviewed    Signed: Ardeen Fillers [place]....August 14, 2016 11:21 AM    PHQ 2    Over the last 2 weeks, how often have you been bothered by any of the following problems?  1. Little interest or pleasure in doing things:  0   - Not at all  2. Feeling down, depressed, or hopeless:  0   - Not at  all      Lipid Panel   Cholesterol: 165  HDL: 48  LDL: 86  Triglycerides: 155  Cholesterol/HDL Ratio: 3.5    Pulse Ox   Result: 94    Signed By: Morton Stall [place], August 14, 2016 11:40 AM      GEN: No acute distress  PSYCH:alert,normal affect and attention span.Normal concentration  HEENT: MM's normal,PERRLA,conjunctiva pink  LUNGS: Clear   HEART: RRR,no MRG.PMI ND.  ZOX:WRUE,AVW tender.Bowel sounds present.No hepatosplenomegally  EXT: no edema  SKIN: Intact.No discoloration       Assessment and Plan:     ~ PREDIABETES (R73.03)   MORBID OBESITY (E66.01)   BMI 36-36.9 ADULT (Z68.36):  count carbs every day:  15 to 30 carbs for breakfast  30 carbs...at the most for lunch  30 to 45 carbs at supper  3 small snacks a day... no more than 15 per snack  never skip meals  eat every 5 hours  never eat a carbohydrate without eating it with a protein,fiber and a small amount  of fat...(apple with cheese or peanut butter)  Do everything: SMART  S:specific  M:measureable  A:achievable  R:realistic  T: test of time    make plate of food as usual.before eating,measure each item.eat your meal.later look up the normal serving size.    next week:...eat to the "wow"...stop when fullfilled not full...stop when satisfied bot stuffed       ~ HYPERLIPIDEMIA (E78.5) :    condition remains stable  Cholesterol: 165  HDL: 48  LDL: 86  Triglycerides: 155  Cholesterol/HDL Ratio: 3.5  continue to eat foods low in cholesterol  stay on current dose of pravastatin    Care plan reviewed and agreed upon with patient and expect fully able to follow plan.      I spent approximately 60 minutes in which 50% or more of the time was spent counseling or coordinating care of the patient's condition.      Med Compliance and SE's: Pt is compliant with meds with no side effects             Patient Instructions    ~ PREDIABETES (R73.03)   MORBID OBESITY (E66.01)   BMI 36-36.9 ADULT (Z68.36):  count carbs every day:  15 to 30 carbs for breakfast  30 carbs...at the most for lunch  30 to 45 carbs at supper  3 small snacks a day... no more than 15 per snack  never skip meals  eat every 5 hours  never eat a carbohydrate without eating it with a protein,fiber and a small amount of fat...(apple with cheese or peanut butter)  Do everything: SMART  S:specific  M:measureable  A:achievable  R:realistic  T: test of time    make plate of food as usual.before eating,measure each item.eat your meal.later look up the normal serving size.    next week:...eat to the "wow"...stop when fullfilled not full...stop when satisfied bot stuffed     ~ HYPERLIPIDEMIA (E78.5) :    condition remains stable  Cholesterol: 165  HDL: 48  LDL: 86  Triglycerides: 155  Cholesterol/HDL Ratio: 3.5  continue to eat foods low in cholesterol  stay on current dose of pravastatin    Care plan reviewed and agreed upon with patient and expect fully able to follow  plan.                ]    Breakfast: omlet  Lunch: egg salad :1 1/2 sandwich          -  DIABETES EDUCATOR TREATMENT PLAN-   Visit Date: 08/14/2016  Visit #: 1  Primary Care: [name]    Reason for Visit: Initial Evaluation Visit Type: prediabetes  New Diagnosis  Ethnicity: Caucasian  Interpreter: No  Previous Diabetes Education? No  Status of Education: Comprehension            -DIABETES EDUCATION TEACHING PLAN -   Diabetes Overview:   08/14/2016  State own type of diabetes:                                Needs Reinforcement  Identify own risk factors:                                     Needs Reinforcement    Psychosocial:   08/14/2016  Share perception/concern of diagnosis:           Needs Reinforcement  Identify family/SO role in managing diabetes:  Needs Reinforcement    Nutritional Management:   08/14/2016  Identify foods in CHO group:                          Needs Reinforcement  State right timing/portions for food & meds:  Needs Reinforcement  State effect of CHO's on blood sugar:            Needs Reinforcement  Identify the CHO grams on food label:           Needs Reinforcement  Identify sugar/fat substitutes/labels:               Needs Reinforcement  Understand basics of CHO counting:              Needs Reinforcement  Name 3 foods that are high in fiber:              State healthy food selections/preparations:  Needs Reinforcement  State effect of wt on blood sugars/lipids:      Needs Reinforcement  Understand effect of alcohol:                       Needs Reinforcement  Needs Reinforcement    Diabetes Exercise and Activity Comments: walking and golf            Lifestyle Changes:    08/14/2016  Acknowledges need to change lifestyle habits/behaviors:  Needs Reinforcement  Identify lifestyle behaviors that need to change:                Needs Reinforcement  Identify barriers to changing lifestyle:                                Needs Reinforcement  Discuss problem solving strategies:                                     Needs Reinforcement

## 2016-08-15 ENCOUNTER — Ambulatory Visit: Admitting: Adolescent Medicine

## 2016-08-15 NOTE — Telephone Encounter (Signed)
Phone Note -       Initial call taken by: Clydie Braun ANP,  August 15, 2016 9:01 PM  Initial Details of Call:  normal blood work      Follow-up #1  Details: patient was left a message to call us back regarding his labs   Action: Left Message for Patient  By: Ardeen Fillers [place] ~ August 16, 2016 10:06 AM

## 2016-09-12 ENCOUNTER — Ambulatory Visit

## 2016-09-16 ENCOUNTER — Ambulatory Visit

## 2016-10-10 ENCOUNTER — Ambulatory Visit

## 2016-11-06 ENCOUNTER — Ambulatory Visit

## 2016-12-18 ENCOUNTER — Ambulatory Visit

## 2017-03-15 ENCOUNTER — Ambulatory Visit

## 2017-03-15 NOTE — Telephone Encounter (Signed)
Phone Note -     Patient    Routine    Call back at Ph1 [phone]  Initial call taken by: Joesph Fillers,  March 15, 2017 1:28 PM  Actual Caller: Patient  Call For: Nurse  Initial Details of Call:  Patient called in stating that he woke up with a gout flare up, the patient did not know the name of the medication that he takes for his gout.   The patient is requesting refill of his medication. [facility] [address] [place]  [place] [address]  [phone]      Follow-up #1  Details: Patient called back, the name of the prescription is Indomethacin 25MG .   By: Lavada Mesi ~ March 15, 2017 1:55 PM    Details: Aware this morning with left toe red and tender - last Gout  flare was about 5 years ago   No known injury.  Pt takes celebrex daily.  Pt may have taken prednisone or indomethacin  in the past with this.  Pt would feel safer with the prednisone,    Last Uric Acid 11-27-13    6.0       Pharmacy not in dictionary  Call to pharmacy for complete information-   Fax -  [phone]  Zip code -[phone]  Address correction- [phone]  [place]      Call to [facility] to Add to dictionary -Spoke with Arlys John  By: Joannie Springs RN ~ March 15, 2017 2:40 PM     I printed rx for medrol dose pack and made a manual   fax to the fax number     Previous Appointment:  08/14/2016  Clydie Braun ANP      07/14/2016  Caffie Damme, M.D.      01/20/2016  [name]      07/21/2015  Ardeen Fillers [place]      01/22/2015  Ardeen Fillers [place]      01/22/2015  Ardeen Fillers [place]      07/01/2014  Ardeen Fillers [place]      01/01/2014  [name]      11/27/2013  Ardeen Fillers [place]          Next Appointment: No future appointments recorded    Last BP:  110/60   (08/14/2016)    Last HGBA1C:  5.9    (07/14/2016)    BMP/CMP  Last BUN:     15    (08/14/2016),                Last EGFR:     65    (08/14/2016)  Last Creatinine:     1.1    (08/14/2016),      Last  Sodium:   140    (08/14/2016)       Last Potassium:     4.3    (08/14/2016)    Last  Cholesterol:     165    (08/14/2016)     Last  LDL:     86    (08/14/2016),    Last  LDL Direct:     ()    Last  SGPT ALT:     22    (08/14/2016),  Last Albumin:     4.1    (08/14/2016)    CBC  Last WBC:     6.1    (07/14/2016)  Last Hgb:     14.2    (  07/14/2016)  Last Platelet Count:     178 K/UL    (07/14/2016)    Last Digoxin Level:        ()    Last TSH:       (),  Last TSH Ultra:   2.77    (07/14/2016)      Last Urine Drug Screen: on .  Narcotic Medications:  LORAZEPAM 1 MG ORAL TABLET (LORAZEPAM)  - Started: 05/23/2005    LORAZEPAM 1 MG ORAL TABLET (LORAZEPAM)  - Started: 03/15/2006    LORAZEPAM 1 MG ORAL TABLET (LORAZEPAM)  - Started: 09/04/2011    LORAZEPAM 1 MG ORAL TABLET (LORAZEPAM)  - Started: 03/11/2012    LORAZEPAM 1 MG ORAL TABLET (LORAZEPAM)  - Started: 08/19/2012    LORAZEPAM 1 MG ORAL TABLET (LORAZEPAM)  - Started: 08/19/2012    LORAZEPAM 1 MG ORAL TABLET (LORAZEPAM)  - Started: 08/19/2012        Directives/Controlled Substance Agreement  HEALTH CARE PROXY   DISCUSED WITH PATIENT -- FULL CODE    PMP Appropriate __YES      ___No      Medications:  MEDROL 4 MG ORAL TABLET THERAPY PACK (METHYLPREDNISOLONE) Take as directed for gout  #1 x 1   Route:ORAL   Entered and Authorized by: [name]   Signed by: [name] on 03/15/2017   Method used: Print then Give to Patient   Note to Pharmacy: Route: ORAL;    RxID: [id number]        Medications:  Added new medication of MEDROL 4 MG ORAL TABLET THERAPY PACK (METHYLPREDNISOLONE) Take as directed for gout; Route: ORAL - Signed  Rx of MEDROL 4 MG ORAL TABLET THERAPY PACK (METHYLPREDNISOLONE) Take as directed for gout; Route: ORAL  #1 x 1;  Signed;  Entered by: [name];  Authorized by: [name];  Method used: Print then Give to Patient; Note to Pharmacy: Route: ORAL;

## 2017-05-07 ENCOUNTER — Ambulatory Visit

## 2017-05-07 NOTE — Telephone Encounter (Signed)
Phone Note -     Patient    **Refill Request Only**    Call back at Ph1 [phone]  Initial call taken by: Joesph Fillers,  May 07, 2017 9:08 AM  Actual Caller: Patient  Call For: Nurse  Initial Details of Call:  The patient called in to request a refill for and was offered to schedule his annual.  The patient last medicare wellness was on 01/22/2015 and is currently schedule for a CPEX on 05/25/2017.  The patient insurance is active and verified        Medications:  LORAZEPAM 1 MG ORAL TABLET (LORAZEPAM) Take one po two times daily as needed for anxiety No cash fill  #180 x 1   Entered by: Joesph Fillers   Authorized by: [name]   Signed by: [name] on 05/07/2017   Method used: Printed then faxed to ...     [phone]     [name]     [name], [place]  [address]     Ph: [phone] or [phone]     Fax: [phone]   RxID: [id number]    Previous Appointment:  08/14/2016  Clydie Braun ANP      07/14/2016  Caffie Damme, M.D.      01/20/2016  [name]      07/21/2015  Ardeen Fillers [place]      01/22/2015  Ardeen Fillers [place]      01/22/2015  Ardeen Fillers [place]      07/01/2014  Ardeen Fillers [place]      01/01/2014  [name]      11/27/2013  Ardeen Fillers [place]          Next Appointment: 05/25/2017 09:20 AM Ceylon Arenson MD, Jonny Ruiz      Last BP:  110/60   (08/14/2016)    Last HGBA1C:  5.9    (07/14/2016)    BMP/CMP  Last BUN:     15    (08/14/2016),                Last EGFR:     65    (08/14/2016)  Last Creatinine:     1.1    (08/14/2016),      Last Sodium:   140    (08/14/2016)       Last Potassium:     4.3    (08/14/2016)    Last  Cholesterol:     165    (08/14/2016)     Last  LDL:     86    (08/14/2016),    Last  LDL Direct:     ()    Last  SGPT ALT:     22    (08/14/2016),  Last Albumin:     4.1    (08/14/2016)    CBC  Last WBC:     6.1    (07/14/2016)  Last Hgb:     14.2    (07/14/2016)  Last Platelet Count:     178 K/UL    (07/14/2016)    Last Digoxin Level:         ()    Last TSH:       (),  Last TSH Ultra:   2.77    (07/14/2016)      Last Urine Drug Screen: on .  Narcotic Medications:  LORAZEPAM 1 MG ORAL TABLET (LORAZEPAM)  - Started: 05/23/2005    LORAZEPAM 1 MG ORAL TABLET (LORAZEPAM)  -  Started: 03/15/2006    LORAZEPAM 1 MG ORAL TABLET (LORAZEPAM)  - Started: 09/04/2011    LORAZEPAM 1 MG ORAL TABLET (LORAZEPAM)  - Started: 03/11/2012    LORAZEPAM 1 MG ORAL TABLET (LORAZEPAM)  - Started: 08/19/2012    LORAZEPAM 1 MG ORAL TABLET (LORAZEPAM)  - Started: 08/19/2012    LORAZEPAM 1 MG ORAL TABLET (LORAZEPAM)  - Started: 08/19/2012        Directives/Controlled Substance Agreement  HEALTH CARE PROXY   DISCUSED WITH PATIENT -- FULL CODE    PMP Appropriate __YES      ___No      Medications:  Rx of LORAZEPAM 1 MG ORAL TABLET (LORAZEPAM) Take one po two times daily as needed for anxiety No cash fill;  #180 x 1;  Signed;  Entered by: Joesph Fillers;  Authorized by: [name];  Method used: Printed then [phone] [address] [place], [place]  [address], Ph: [phone] or [phone], Fax: [phone]

## 2017-05-25 ENCOUNTER — Ambulatory Visit

## 2017-05-25 ENCOUNTER — Ambulatory Visit: Admitting: Internal Medicine

## 2017-05-25 LAB — HX MICROALBUMIN (RANDOM URINE)
HX ALBUMIN/CREAT RATIO RANDOM UR: 5.2 mg/g (ref 0.0–20.0)
HX MICROALBUMIN (RANDOM URINE): 10.6 mg/L (ref <=20.0)
HX URINE CREATININE (RANDOM): 204 mg/dL (ref 15.0–392.0)

## 2017-05-25 LAB — HX ROUT.URINE WITH MICROSCOPIC
HX URINE BILIRUBIN: NEGATIVE
HX URINE BLOOD: NEGATIVE
HX URINE ESTERASE: NEGATIVE
HX URINE GLUCOSE: NEGATIVE
HX URINE KETONES: NEGATIVE
HX URINE NITRITE: NEGATIVE
HX URINE PH: 5 (ref 5.0–8.0)
HX URINE PROTEIN: NEGATIVE
HX URINE RBC: <1 (ref 0.0–2.0)
HX URINE SPECIFIC GRAVITY: 1.024 (ref 1.003–1.03)
HX URINE SQUAMOUS EPITHELIAL: <1 (ref 0.0–5.0)
HX URINE WBC: 1 /HPF (ref 0.0–5.0)
HX UROBILINOGEN, URINE: NEGATIVE

## 2017-05-25 NOTE — Progress Notes (Signed)
Primary Provider:  [name]      History of Present Illness:  Justin Navarro is a 77 Year Old Male who presents today for: annual physical  Specialists seen since last visit? no  Is there an updated release of information to speak for the patient on file (valid for 1 year only)?   Has previsit planning and a huddle been performed on this patient? yes    medication reconciliation done   says he wintered well in [place]   in [place]     Medications Prior to this Visit  ASPIRIN 81 MG ORAL TABLET DELAYED RELEASE (ASPIRIN) take one by mouth daily  SYMBICORT 160-4.5 MCG/ACT INHALATION AEROSOL (BUDESONIDE-FORMOTEROL FUMARATE) two puffs twice a day for asthma rinse afterwards  SYNTHROID 100 MCG ORAL TABLET (LEVOTHYROXINE SODIUM) Take one po once daily  NEW HIGHER DOSE; Route: ORAL  PRAVASTATIN SODIUM 40 MG ORAL TABLET (PRAVASTATIN SODIUM) Take one po q hs Recheck chol in 2 months  COMPAIR NEBULIZER (NEBULIZERS) Use with albuterol nebs four times daily  IPRATROPIUM-ALBUTEROL 0.5-2.5 (3) MG/3ML INHALATION SOLUTION (IPRATROPIUM-ALBUTEROL) use in neb machine four times daily  Asthma  Dx J45.909  CELEBREX 200 MG ORAL CAPSULE (CELECOXIB) Take one po daily DO NOT TAKE IF TAKING INDOCIN  FLUTICASONE PROPIONATE 50 MCG/ACT NASAL SUSPENSION (FLUTICASONE PROPIONATE) 2 sprays each nostril daily for nasal symptoms  VENTOLIN HFA 108 (90 Base) MCG/ACT INHALATION AEROSOL SOLUTION (ALBUTEROL SULFATE) Take two whiffs q 4 hrs as needed for wheezing; Route: INHALATION  MIRTAZAPINE 15 MG ORAL TABLET (MIRTAZAPINE) take one po at hs as needed for insomnia  LORAZEPAM 1 MG ORAL TABLET (LORAZEPAM) Take one po two times daily as needed for anxiety No cash fill  PREDNISONE 20 MG ORAL TABLET (PREDNISONE) Take one po two times daily with food; Route: ORAL  SPIRIVA HANDIHALER 18 MCG INHALATION CAPSULE (TIOTROPIUM BROMIDE MONOHYDRATE) 1 inhalation daily for copd; Route: INHALATION  MEDROL 4 MG ORAL TABLET THERAPY PACK (METHYLPREDNISOLONE)  Take as directed for gout; Route: ORAL    Current Problems- Reviewed during today's visit  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)  PREDIABETES (ICD10-R73.03)  MORBID OBESITY (ICD10-E66.01)  BMI 36-36.9 ADULT (ICD10-Z68.36)  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)  COLONIC POLYPS, HX OF (ICD10-Z86.010)  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)  ASTHMA, 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)  CARCINOMA, 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)  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)  HYPERLIPIDEMIA (ICD10-E78.5)  HYPOTHYROIDISM (ICD10-E03.9)  TESTOSTERONE 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)  ERECTILE DYSFUNCTION (ICD10-N52.9)  GOUT (ICD10-M10.9)  GERD (YQM57-Q46.9)  ANXIETY (ICD10-F41.9)  VITILIGO-HANDS (ICD10-L80)  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)    Current Medications- Reviewed during today's visit  SYMBICORT 160-4.5 MCG/ACT INHALATION AEROSOL (BUDESONIDE-FORMOTEROL FUMARATE): two puffs twice a day for asthma rinse afterwards  SYNTHROID 100 MCG ORAL TABLET (LEVOTHYROXINE SODIUM): Take one po once daily  NEW HIGHER DOSE  PRAVASTATIN SODIUM 40 MG ORAL TABLET: Take one po q hs Recheck chol in 2 months  COMPAIR NEBULIZER (NEBULIZERS): Use with albuterol nebs four times daily  IPRATROPIUM-ALBUTEROL 0.5-2.5 (3) MG/3ML INHALATION SOLUTION: use in neb machine four times daily  Asthma  Dx J45.909  CELEBREX 200 MG ORAL CAPSULE (CELECOXIB): Take one po daily DO NOT TAKE IF TAKING INDOCIN  VENTOLIN HFA 108 (90 Base) MCG/ACT INHALATION AEROSOL SOLUTION (ALBUTEROL SULFATE): Take two whiffs q 4 hrs as needed for wheezing  MIRTAZAPINE 15 MG ORAL TABLET: take one po at hs as needed for insomnia  LORAZEPAM 1 MG ORAL TABLET: Take one po two times daily as needed for anxiety No cash fill  CLOTRIMAZOLE-BETAMETHASONE 1-0.05 % EXTERNAL CREAM (  Clotrimazole-Betamethasone): apply to affected area two times daily for fungal itching  HYDROXYZINE PAMOATE 25 MG ORAL CAPSULE: Take one by mouth at hs     as needed for itching    Current Allergies- Reviewed during today's  visit  PENICILLIN (Critical)  * CT CONTRAST (Critical)  * BELSOMRA (Mild)    Current Directives- Reviewed during today's visit  HEALTH CARE PROXY  DISCUSED WITH PATIENT -- FULL CODE    Past Medical History  remotely in three different detox  colonoscopy-2005-diverticulosis and hemorrhoids  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  Surgical History  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  appendectomy age 86  s/p T and A age 75 and age 53  Family History  Father:  died age 74, cancer (oral) and cancer of colon, alcoholic  Mother:  died age 55, cancer of breast, alcoholic  Siblings:  one sister COPD, alcoholic  Social History  Marital Status:  divorced  Children: one son age 10  Lives With: girlfriend [name] in winter  Occupation: retired,   former Company secretary --retired age 61--1995     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 age [age]           Risk Factors  Smoking Status:former smoker  Year quit: 1998  Pack-years: 40  Smoking Comments:  started smoking age [age]  Passive smoke exposure: No    Drug use: no  Alcohol use: no    Exercise: Yes  Times per week: 3  Exercise Comments:  walking and golf    Caffeine (drinks/day): 2  Sun exposure: rarely  Seatbelt use (%): 100  Family History MI in male age < 82: no  Family History MI in male age < 66: no  Bladder Control  issues: no  Safety concerns: no  Falls Information:  Past year - no        Review of Systems   General: Denies fever, chills, sweats, anorexia, fatigue, weakness, malaise, weight loss.   Eyes: Denies visual change or blurring, eye pain.   Ears/Nose/Throat: Denies earache, decreased hearing, difficulty swallowing.   Cardiovascular: Denies chest pain or pressure, palpitations, shortness of breath.   Respiratory: Denies dry cough, productive cough, shortness of breath, wheezing.   Gastrointestinal: Denies acid indigestion, nausea, vomiting, diarrhea, abdominal pain, change in bowel habits, constipation, mucous or blood  in stools.   Musculoskeletal: Denies muscle cramps or aches, muscle weakness, morning stiffness, joint pain, joint swelling.   Skin: Denies dry skin, rash, skin ulcers, suspicious lesions.   Psychiatric: Denies anxiety, depression, insomnia.     Vital Signs     Patient: 77 Years Old Male  Height:  70.6 in.  Weight: 248 lbs      Wt Chg: -6 since 08/14/2016  BMI:  35.11        35.96 on 07/14/2016  BP:  122/80 left arm, large cuff, seated     110/60 on 08/14/2016   Temp:  98.1  F    oral  Pulse:  74         Pulse Ox: 95 %  On Oxygen: No    Patient is not experiencing pain    Medications and Allergies Reviewed    Signed: Morton Stall [place].Marland KitchenMarland KitchenMarland KitchenApril 26, 2019 9:41 AM        Pulse Ox   Result: 95        Physical Exam    General:      well developed, well nourished,  in no acute distress.    Head:      normocephalic and atraumatic.    Eyes:      PERRL/EOM intact, conjunctiva and sclera clear with out nystagmus.  sees [facility]   in past had cataract surgery in [place]   Ears:      TM's intact and clear with normal canals with grossly normal hearing.    Nose:      no deformity, discharge, inflammation, or lesions.    Mouth:      post nasal drip.      no dental issues--false teeth  Neck:      no masses, thyromegaly, or abnormal cervical nodes.    Chest Wall:      no deformities or breast masses noted.    Lungs:      barrel chest   decreased air exchange from COPD  Heart:      non-displaced PMI, chest non-tender; regular rate and rhythm, S1, S2 without murmurs, rubs, or gallops  Abdomen:       normal bowel sounds; no hepatosplenomegaly no ventral,umbilical hernias or masses noted.    Rectal:      colonoscopy 2015   due 2020  Prostate:      PSA screen          Assessment and Plan:      ~PREDIABETES    morbid obesity recheck A1C    ~HYPOTHYROIDISM    Medication(s): SYNTHROID 100 MCG ORAL TABLET (LEVOTHYROXINE SODIUM): Take one po once daily  NEW HIGHER DOSE  check TSH    ~GOUT  --says that gout no longer occurring       ~PRURITUS IN GROIN WITH NO RASH  --trial of treatment with cream and hydroxyzine       ~COPD-MOD  he does not want to use spiriva --    CXR      No over the counter medications.   Med Compliance and SE's: Pt is compliant with meds with no side effects     Medications Removed Today:   ASPIRIN 81 MG ORAL TABLET DELAYED RELEASE (ASPIRIN) take one by mouth daily  FLUTICASONE PROPIONATE 50 MCG/ACT NASAL SUSPENSION (FLUTICASONE PROPIONATE) 2 sprays each nostril daily for nasal symptoms  PREDNISONE 20 MG ORAL TABLET (PREDNISONE) Take one po two times daily with food; Route: ORAL  SPIRIVA HANDIHALER 18 MCG INHALATION CAPSULE (TIOTROPIUM BROMIDE MONOHYDRATE) 1 inhalation daily for copd; Route: INHALATION  MEDROL 4 MG ORAL TABLET THERAPY PACK (METHYLPREDNISOLONE) Take as directed for gout; Route: ORAL    New/Revised  Medications Today:   CLOTRIMAZOLE-BETAMETHASONE 1-0.05 % EXTERNAL CREAM (Clotrimazole-Betamethasone) apply to affected area two times daily for fungal itching; Route: EXTERNAL  HYDROXYZINE PAMOATE 25 MG ORAL CAPSULE (HYDROXYZINE PAMOATE) Take one by mouth at hs     as needed for itching; Route: ORAL      Medications:  HYDROXYZINE PAMOATE 25 MG ORAL CAPSULE (HYDROXYZINE PAMOATE) Take one by mouth at hs     as needed for itching  #30[Capsule] x 11   Route:ORAL   Entered and Authorized by: [name]   Signed by: [name] on 05/25/2017   Method used: [facility]      [id number]     [address] [place], [place]  [address]     Ph: [phone] or [phone]     Fax: [phone]   Note to Pharmacy: Route: ORAL;    RxID: [id number]  CLOTRIMAZOLE-BETAMETHASONE 1-0.05 % EXTERNAL CREAM (Clotrimazole-Betamethasone)  apply to affected area two times daily for fungal itching  #45[Gram] x 1   Route:EXTERNAL   Entered and Authorized by: [name]   Signed by: [name] on 05/25/2017   Method used: [facility]ically [facility]      [id number]     [address]  [place], [place]  [address]     Ph: [phone] or [phone]     Fax: [phone]   Note to Pharmacy: Route: EXTERNAL;    RxID: [id number]  Cancelled SPIRIVA HANDIHALER 18 MCG INHALATION CAPSULE (TIOTROPIUM BROMIDE MONOHYDRATE) 1 inhalation daily for copd  #90[Capsule] x 3   Route:INHALATION   Entered by: [name]   Authorized by: Caffie Damme, M.D.   Signed by: [name] on 05/25/2017   Method used: Electronically to      [facility]     [address] [place], [place]  [address]     Ph: [phone]     Fax: [phone]   RxID: [id number]  Cancelled FLUTICASONE PROPIONATE 50 MCG/ACT NASAL SUSPENSION (FLUTICASONE PROPIONATE) 2 sprays each nostril daily for nasal symptoms  #3[Unspecified] x 3   Entered and Authorized by: [name]   Signed by: [name] on 05/25/2017   Method used: Electronically to      [facility] ([facility]     [address] [place], [place]  [address]     Ph: [phone]     Fax: [phone]   RxID: [id number]

## 2017-05-25 NOTE — Progress Notes (Signed)
[facility] - [address]   [address]   [place], [place] [address]  Office: [phone] Fax: [phone]  Active Medication List and Instructions  May 25, 2017     Justin Navarro  04/08/1940    1)  SYMBICORT 160-4.5 MCG/ACT INHALATION AEROSOL (BUDESONIDE-FORMOTEROL FUMARATE) two puffs twice a day for asthma rinse afterwards  2)  SYNTHROID 100 MCG ORAL TABLET (LEVOTHYROXINE SODIUM) Take one po once daily  NEW HIGHER DOSE; Route: ORAL  3)  PRAVASTATIN SODIUM 40 MG ORAL TABLET (PRAVASTATIN SODIUM) Take one po q hs Recheck chol in 2 months  4)  COMPAIR NEBULIZER (NEBULIZERS) Use with albuterol nebs four times daily  5)  IPRATROPIUM-ALBUTEROL 0.5-2.5 (3) MG/3ML INHALATION SOLUTION (IPRATROPIUM-ALBUTEROL) use in neb machine four times daily  Asthma  Dx J45.909  6)  CELEBREX 200 MG ORAL CAPSULE (CELECOXIB) Take one po daily DO NOT TAKE IF TAKING INDOCIN  7)  VENTOLIN HFA 108 (90 Base) MCG/ACT INHALATION AEROSOL SOLUTION (ALBUTEROL SULFATE) Take two whiffs q 4 hrs as needed for wheezing; Route: INHALATION  8)  MIRTAZAPINE 15 MG ORAL TABLET (MIRTAZAPINE) take one po at hs as needed for insomnia  9)  LORAZEPAM 1 MG ORAL TABLET (LORAZEPAM) Take one po two times daily as needed for anxiety No cash fill    Allergies:  1)  PENICILLIN (Critical)  2)  * CT CONTRAST (Critical)  3)  * BELSOMRA (Mild)    Abbreviations:    bid= twice a day   Q= per  D= day    QD= per day  HS= bed time   QID= 4 times a day  OTC= over the counter  QOD= every other day  PO= by mouth   TID= three times a day  PRN= as needed   TID= 3 times a day

## 2017-05-25 NOTE — Progress Notes (Signed)
Medications:  Changed medication from HYDROXYZINE PAMOATE 25 MG ORAL CAPSULE (HYDROXYZINE PAMOATE) Take one by mouth at hs     as needed for itching; Route: ORAL to HYDROXYZINE HCL 25 MG ORAL TABLET (HYDROXYZINE HCL) Take one by mouth at hs as needed for itching; Route: ORAL - Signed  Rx of HYDROXYZINE HCL 25 MG ORAL TABLET (HYDROXYZINE HCL) Take one by mouth at hs as needed for itching; Route: ORAL  #30[Tablet] x 11;  Signed;  Entered by: [name];  Authorized by: [name];  Method used: [phone]*, [address] [place], [place]  [address], Ph: [phone] or [phone], Fax: [phone]; Note to Pharmacy: Route: ORAL;

## 2017-05-25 NOTE — Progress Notes (Signed)
[facility] - [address]   [address]   [place], [place] [address]  Office: [phone] Fax: [phone]  Active Medication List and Instructions  May 25, 2017     Justin Navarro  [birth year]    1)  ASPIRIN 81 MG ORAL TABLET DELAYED RELEASE (ASPIRIN) take one by mouth daily  2)  SYMBICORT 160-4.5 MCG/ACT INHALATION AEROSOL (BUDESONIDE-FORMOTEROL FUMARATE) two puffs twice a day for asthma rinse afterwards  3)  SYNTHROID 100 MCG ORAL TABLET (LEVOTHYROXINE SODIUM) Take one po once daily  NEW HIGHER DOSE; Route: ORAL  4)  PRAVASTATIN SODIUM 40 MG ORAL TABLET (PRAVASTATIN SODIUM) Take one po q hs Recheck chol in 2 months  5)  COMPAIR NEBULIZER (NEBULIZERS) Use with albuterol nebs four times daily  6)  IPRATROPIUM-ALBUTEROL 0.5-2.5 (3) MG/3ML INHALATION SOLUTION (IPRATROPIUM-ALBUTEROL) use in neb machine four times daily  Asthma  Dx J45.909  7)  CELEBREX 200 MG ORAL CAPSULE (CELECOXIB) Take one po daily DO NOT TAKE IF TAKING INDOCIN  8)  FLUTICASONE PROPIONATE 50 MCG/ACT NASAL SUSPENSION (FLUTICASONE PROPIONATE) 2 sprays each nostril daily for nasal symptoms  9)  VENTOLIN HFA 108 (90 Base) MCG/ACT INHALATION AEROSOL SOLUTION (ALBUTEROL SULFATE) Take two whiffs q 4 hrs as needed for wheezing; Route: INHALATION  10)  MIRTAZAPINE 15 MG ORAL TABLET (MIRTAZAPINE) take one po at hs as needed for insomnia  11)  LORAZEPAM 1 MG ORAL TABLET (LORAZEPAM) Take one po two times daily as needed for anxiety No cash fill  12)  PREDNISONE 20 MG ORAL TABLET (PREDNISONE) Take one po two times daily with food; Route: ORAL  13)  SPIRIVA HANDIHALER 18 MCG INHALATION CAPSULE (TIOTROPIUM BROMIDE MONOHYDRATE) 1 inhalation daily for copd; Route: INHALATION  14)  MEDROL 4 MG ORAL TABLET THERAPY PACK (METHYLPREDNISOLONE) Take as directed for gout; Route: ORAL    Allergies:  1)  PENICILLIN (Critical)  2)  * CT CONTRAST (Critical)  3)  * BELSOMRA (Mild)    Abbreviations:    bid= twice a day   Q= per  D= day    QD= per day  HS=  bed time   QID= 4 times a day  OTC= over the counter  QOD= every other day  PO= by mouth   TID= three times a day  PRN= as needed   TID= 3 times a day

## 2017-05-26 LAB — HX CBC W/DIFF
HX ABSOLUTE BASO COUNT AUTODIFF: 0.03 10*3/uL (ref 0.0–0.22)
HX ABSOLUTE EOS COUNT AUTODIFF: 0.45 10*3/uL (ref 0.0–0.45)
HX ABSOLUTE LYMPHS COUNT AUTODIFF: 1.25 10*3/uL (ref 0.74–5.04)
HX ABSOLUTE MONO COUNT AUTODIFF: 0.54 10*3/uL (ref 0.0–1.34)
HX ABSOLUTE NEUTRO COUNT AUTODIFF: 3.08 10*3/uL (ref 1.48–7.95)
HX BASOPHIL AUTOMATED: 0.6 %
HX EOSINOPHIL AUTOMATED: 8.4 %
HX HEMATOCRIT: 46.8 % (ref 39.0–53.0)
HX HEMOGLOBIN: 14.8 g/dL (ref 13.0–17.5)
HX IG AUTOMATED: 0.4 % (ref 0.0–2.0)
HX LYMPHOCYTE AUTOMATED: 23.3 %
HX MEAN CORP.HEMO.CONC.: 31.6 g/dL (ref 31.0–37.0)
HX MEAN CORPUSCULAR HEMOGLOBIN: 30 pg (ref 26.0–34.0)
HX MEAN CORPUSCULAR VOLUME: 94.7 fL (ref 80.0–100.0)
HX MEAN PLATELET VOLUME: 11.6 fL (ref 9.4–12.4)
HX MONOCYTE AUTOMATED: 10.1 %
HX NEUTROPHIL AUTOMATED: 57.2 %
HX NUCLEATED RBC %: 0 % (ref 0.0–0.0)
HX PLATELET COUNT: 206 10*3/uL (ref 150.0–400.0)
HX RED BLOOD COUNT: 4.94 10*6/uL (ref 4.2–5.9)
HX RED CELL DISTRIBUTION WIDTH SD: 46.5 fL (ref 35.0–51.0)
HX WHITE BLOOD COUNT: 5.4 10*3/uL (ref 4.0–11.0)

## 2017-05-26 LAB — HX COMPREHENSIVE METABOLIC PANEL
HX ALBUMIN: 3.8 g/dL (ref 3.2–5.0)
HX ALKALINE PHOSPHATASE: 87 U/L (ref 30.0–117.0)
HX ALT: 35 U/L (ref 6.0–55.0)
HX ANION GAP: 3 (ref 3.0–11.0)
HX AST: 32 U/L (ref 6.0–40.0)
HX BICARBONATE: 31 mmol/L (ref 21.0–32.0)
HX BUN: 16 mg/dL (ref 8.0–23.0)
HX CALCIUM: 8.9 mg/dL (ref 8.5–10.5)
HX CHLORIDE: 106 mmol/L (ref 98.0–110.0)
HX CREATININE: 1.13 mg/dL (ref 0.55–1.3)
HX GLOMERULAR FR AFRICAN AMERICAN: 73
HX GLOMERULAR FR NON AFRICAN AMER: 63
HX GLUCOSE: 95 mg/dL (ref 70.0–110.0)
HX POTASSIUM: 4.4 mmol/L (ref 3.6–5.2)
HX SODIUM: 140 mmol/L (ref 136.0–146.0)
HX TOTAL BILIRUBIN: 0.6 mg/dL (ref 0.2–1.2)
HX TOTAL PROTEIN: 6.9 g/dL (ref 6.0–8.4)

## 2017-05-26 LAB — HX LIPID PANEL FASTING
HX CHOLESTEROL (LIPR): 171 mg/dL (ref <=200)
HX HDL CHOLESTEROL: 45 mg/dL (ref >=40)
HX LDL CHOLESTEROL: 102 mg/dL (ref <=130)
HX TRIGLYCERIDES: 120 mg/dL (ref <=150)

## 2017-05-26 LAB — HX PROSTATE SPECIFIC ANTIGEN SCR: HX PROSTATE SPECIFIC ANTIGEN SCR: 0.69 ng/mL (ref 0.0–4.0)

## 2017-05-26 LAB — HX GLYCOHEMOGLOBIN
HX ESTIMATED AVERAGE GLUCOSE: 131 mg/dL
HX HEMOGLOBIN A1C: 6.2 % — ABNORMAL HIGH (ref <=5.6)

## 2017-05-26 LAB — HX DIRECT BILIRUBIN: HX DIRECT BILIRUBIN: 0.1 mg/dL (ref 0.0–0.3)

## 2017-05-26 LAB — HX TSH WITH REFLEX: HX TSH WITH REFLEX: 3.96 u[IU]/mL — ABNORMAL HIGH (ref 0.358–3.74)

## 2017-05-27 ENCOUNTER — Ambulatory Visit: Admitting: Internal Medicine

## 2017-05-27 NOTE — Progress Notes (Signed)
Problems:  Changed problem from PREDIABETES (ICD-790.29) (ICD10-R73.03) to PREDIABETES=A1C OF 6.2 (ICD-790.29) (ICD10-R73.09)

## 2017-05-27 NOTE — Telephone Encounter (Signed)
Phone Note -     Outgoing Call    Initial call taken by: [name],  May 27, 2017 11:41 AM  Summary of Call: if he is taking 100 mc synthroid daily please increase to 112   as the TSH is elevated      Follow-up #1  Details: Call to patient to  review Dr Mudrock's information- States he is taking the synthroid at present.  Agrees to increase to 112 mcg daily.  [facility] [place].   By: Joannie Springs RN ~ May 28, 2017 9:32 AM      Medications:  SYNTHROID 112 MCG ORAL TABLET (LEVOTHYROXINE SODIUM) Take one by mouth once daily new dose size  #90[Tablet] x 3   Route:ORAL   Entered and Authorized by: [name]   Signed by: [name] on 05/28/2017   Method used: [facility]      [id number]     [address]     [place], [place]  [address]     Ph: [phone] or [phone]     Fax: [phone]   Note to Pharmacy: Route: ORAL;    RxID: [id number]        Medications:  Changed medication from SYNTHROID 100 MCG ORAL TABLET (LEVOTHYROXINE SODIUM) Take one po once daily  NEW HIGHER DOSE; Route: ORAL to SYNTHROID 112 MCG ORAL TABLET (LEVOTHYROXINE SODIUM) Take one by mouth once daily new dose size; Route: ORAL - Signed  Rx of SYNTHROID 112 MCG ORAL TABLET (LEVOTHYROXINE SODIUM) Take one by mouth once daily new dose size; Route: ORAL  #90[Tablet] x 3;  Signed;  Entered by: [name];  Authorized by: [name];  Method used[phone] [address] [place], [place]  [address], Ph: [phone] or [phone], Fax: [phone]; Note to Pharmacy: Route: ORAL;

## 2017-06-13 ENCOUNTER — Ambulatory Visit

## 2017-06-13 ENCOUNTER — Ambulatory Visit: Admitting: Internal Medicine

## 2017-06-13 NOTE — Telephone Encounter (Signed)
Phone Note -     Patient    Initial call taken by: Kennis Carina,  Jun 13, 2017 4:02 PM  Initial Details of Call:  Patient called to get document to have chest x-ray. patient can be reached at [phone]. Patient would like to be able to pick up document, please contact to let him know when it is ready for pick up.     Reason for Call: Lab or Test Results    Follow-up #1  Details: I told him he can go to [facility]m a, the order is already in the chart  By: Sarita Haver RN ~ Jun 13, 2017 4:26 PM

## 2017-06-14 ENCOUNTER — Ambulatory Visit: Admitting: Internal Medicine

## 2017-06-14 NOTE — Telephone Encounter (Signed)
Phone Note -       Initial call taken by: [name],  Jun 14, 2017 9:06 AM  Initial Details of Call:  CXR unchanged from 2018-clear          Frontal and lateral views demonstrate no interval discrete pleural or      parenchymal abnormality.  The cardiomediastinal silhouette and      pulmonary vascularity remain stable. No significant bony abnormality      has developed.            Impression:  Compared to 07/14/2016, no significant interval change.      Follow-up #1  Details: Spoke with PT, and informed him that there has been no significant changes since his 2018 CXR scan.   Action: Phone call completed  By: Danie Chandler [place] ~ Jun 14, 2017 9:50 AM

## 2017-06-26 ENCOUNTER — Ambulatory Visit

## 2017-06-26 NOTE — Progress Notes (Signed)
Primary Provider:  [name]      History of Present Illness:  Justin Navarro is a 77 year old Male who presents today for: COPD Flare Up  Is there an updated release of information to speak for the patient on file (valid for 1 year only)? yes  Has previsit planning and a huddle been performed on this patient? yes    says COPD is bothering him   Duoneb was given now   in the neb machine   has a machine at home but not using it  regularly     Medications Prior to this Visit  SYMBICORT 160-4.5 MCG/ACT INHALATION AEROSOL (BUDESONIDE-FORMOTEROL FUMARATE) two puffs twice a day for asthma rinse afterwards  SYNTHROID 112 MCG ORAL TABLET (LEVOTHYROXINE SODIUM) Take one by mouth once daily new dose size; Route: ORAL  PRAVASTATIN SODIUM 40 MG ORAL TABLET (PRAVASTATIN SODIUM) Take one po q hs Recheck chol in 2 months  COMPAIR NEBULIZER (NEBULIZERS) Use with albuterol nebs four times daily  IPRATROPIUM-ALBUTEROL 0.5-2.5 (3) MG/3ML INHALATION SOLUTION (IPRATROPIUM-ALBUTEROL) use in neb machine four times daily  Asthma  Dx J45.909  CELEBREX 200 MG ORAL CAPSULE (CELECOXIB) Take one po daily DO NOT TAKE IF TAKING INDOCIN  VENTOLIN HFA 108 (90 Base) MCG/ACT INHALATION AEROSOL SOLUTION (ALBUTEROL SULFATE) Take two whiffs q 4 hrs as needed for wheezing; Route: INHALATION  MIRTAZAPINE 15 MG ORAL TABLET (MIRTAZAPINE) take one po at hs as needed for insomnia  LORAZEPAM 1 MG ORAL TABLET (LORAZEPAM) Take one po two times daily as needed for anxiety No cash fill  CLOTRIMAZOLE-BETAMETHASONE 1-0.05 % EXTERNAL CREAM (Clotrimazole-Betamethasone) apply to affected area two times daily for fungal itching; Route: EXTERNAL  HYDROXYZINE HCL 25 MG ORAL TABLET (HYDROXYZINE HCL) Take one by mouth at hs as needed for itching; Route: ORAL    Current Problems  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)  PREDIABETES=A1C OF 6.2 (ICD10-R73.09)  MORBID OBESITY (ICD10-E66.01)  BMI 36-36.9 ADULT (ICD10-Z68.36)  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)  COLONIC POLYPS, HX OF (ICD10-Z86.010)  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)  ASTHMA, 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)  CARCINOMA, 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)  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)  HYPERLIPIDEMIA (ICD10-E78.5)  HYPOTHYROIDISM (ICD10-E03.9)  TESTOSTERONE 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)  ERECTILE DYSFUNCTION (ICD10-N52.9)  GOUT (ICD10-M10.9)  GERD (CBJ62-G31.9)  ANXIETY (ICD10-F41.9)  VITILIGO-HANDS (ICD10-L80)  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)    Current Medications- Reviewed during today's visit  SYMBICORT 160-4.5 MCG/ACT INHALATION AEROSOL (BUDESONIDE-FORMOTEROL FUMARATE): two puffs twice a day for asthma rinse afterwards  SYNTHROID 112 MCG ORAL TABLET (LEVOTHYROXINE SODIUM): Take one by mouth once daily new dose size  PRAVASTATIN SODIUM 40 MG ORAL TABLET: Take one po q hs Recheck chol in 2 months  COMPAIR NEBULIZER (NEBULIZERS): Use with albuterol nebs four times daily  IPRATROPIUM-ALBUTEROL 0.5-2.5 (3) MG/3ML INHALATION SOLUTION: use in neb machine four times daily  Asthma  Dx J45.909  CELEBREX 200 MG ORAL CAPSULE (CELECOXIB): Take one po daily DO NOT TAKE IF TAKING INDOCIN  VENTOLIN HFA 108 (90 Base) MCG/ACT INHALATION AEROSOL SOLUTION (ALBUTEROL SULFATE): Take two whiffs q 4 hrs as needed for wheezing  MIRTAZAPINE 15 MG ORAL TABLET: take one po at hs as needed for insomnia  LORAZEPAM 1 MG ORAL TABLET: Take one po two times daily as needed for anxiety No cash fill  CLOTRIMAZOLE-BETAMETHASONE 1-0.05 % EXTERNAL CREAM (Clotrimazole-Betamethasone): apply to affected area two times daily for fungal itching  HYDROXYZINE HCL 25 MG ORAL TABLET: Take one by mouth at hs as needed for itching  AZITHROMYCIN 500 MG ORAL TABLET: one  by mouth daily for ten days for infection  PREDNISONE 10 MG ORAL TABLET: For a flair up in your breathing problem:  4 tabs daily x 3 days, then decrease by 1 tab every other day    Current Allergies  PENICILLIN (Critical)  * CT CONTRAST (Critical)  * BELSOMRA  (Mild)    Past Medical History  remotely in three different detox  colonoscopy-2005-diverticulosis and hemorrhoids  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  Surgical History  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  appendectomy age 28  s/p T and A age 17 and age 54  Family History  Father:  died age 76, cancer (oral) and cancer of colon, alcoholic  Mother:  died age 85, cancer of breast, alcoholic  Siblings:  one sister COPD, alcoholic  Social History  Marital Status:  divorced  Children: one son age 42  Lives With: girlfriend [name] in winter  Occupation: retired,   former Company secretary --retired age 36--1995     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 age [age]           Risk Factors  Smoking Status: former smoker  Year quit: 1998  Pack-years: 40  Smoking Comments:  started smoking age [age]  Passive smoke exposure: No    Drug use: no  Alcohol use: no    Exercise: Yes  Times per week: 3  Exercise Comments:  walking and golf    Caffeine (drinks/day): 2  Sun exposure: rarely  Seatbelt use (%): 100  Family History MI in male age < 26: no  Family History MI in male age < 72: no  Bladder Control  issues: no  Safety concerns: no  Falls Information:  Past year - no        Review of Systems   General: Denies fever, chills, sweats, anorexia, fatigue, weakness, malaise, weight loss.   Eyes: Denies visual change or blurring, eye pain.   Ears/Nose/Throat: Denies earache, decreased hearing, difficulty swallowing.   Cardiovascular: Denies chest pain or pressure, palpitations, shortness of breath.   Respiratory: Denies dry cough, productive cough, shortness of breath, wheezing.   Gastrointestinal: Denies acid indigestion, nausea, vomiting, diarrhea, abdominal pain, change in bowel habits, constipation, mucous or blood in stools.   Musculoskeletal: Denies muscle cramps or aches, muscle weakness, morning stiffness, joint pain, joint swelling.   Skin: Denies dry skin, rash, skin ulcers,  suspicious lesions.   Psychiatric: Denies anxiety, depression, insomnia.     Vital Signs     Patient: 77 Years Old Male  Height:  70.6 in.  Weight: 253 lbs      Wt Chg: 5 since 05/25/2017  BMI:  35.82        35.11 on 05/25/2017  BP:  110/58 left arm, large cuff, seated     122/80 on 05/25/2017   Temp:  97.59  F    oral  Pulse:  66         Pulse Ox: 95 %    Patient is not experiencing pain    Medications Reviewed     Signed: Tresa [facility] [place]....Jun 26, 2017 1:13 PM    PHQ 2    Over the last 2 weeks, how often have you been bothered by any of the following problems?  1. Little interest or pleasure in doing things:  0   - Not at all  2. Feeling down, depressed, or hopeless:  0   - Not at all  Physical Exam    General:      well developed, well nourished, in no acute distress.    Head:      normocephalic and atraumatic.    Eyes:      PERRL/EOM intact, conjunctiva and sclera clear with out nystagmus.  sees [facility]   in past had cataract surgery in [place]   Ears:      TM's intact and clear with normal canals with grossly normal hearing.    Nose:      no deformity, discharge, inflammation, or lesions.    Mouth:      post nasal drip.      no dental issues--false teeth  Neck:      no masses, thyromegaly, or abnormal cervical nodes.    Chest Wall:      no deformities or breast masses noted.    Lungs:      wheezes on L and wheezes on R.  mild  Heart:      non-displaced PMI, chest non-tender; regular rate and rhythm, S1, S2 without murmurs, rubs, or gallops  Abdomen:       normal bowel sounds; no hepatosplenomegaly no ventral,umbilical hernias or masses noted.    Msk:      generalized OA  Pulses:      pulses normal in all 4 extremities.    Extremities:      No edema  Neurologic:      no focal deficits, cranial nerves II-XII grossly intact with normal sensation, reflexes, coordination, muscle strength and tone.    Skin:      intact without lesions or rashes.    Cervical Nodes:      no significant adenopathy.     Axillary Nodes:      no significant adenopathy.    Inguinal Nodes:      no significant adenopathy.    Psych:      alert and cooperative; normal mood and affect; normal attention span and concentration.           Assessment and Plan:      ~COPD-MOD    exacerbation ---prednisone taper  use the nebs at home  azithromycin 500 every day times ten days   x-ray done already this month       No over the counter medications.   Med Compliance and SE's: Pt is compliant with meds with no side effects     Changes to Medication List Documented Today:   AZITHROMYCIN 500 MG ORAL TABLET (AZITHROMYCIN) one by mouth daily for ten days for infection; Route: ORAL  PREDNISONE 10 MG ORAL TABLET (PREDNISONE) For a flair up in your breathing problem:  4 tabs daily x 3 days, then decrease by 1 tab every other day; Route: ORAL      Medications:  IPRATROPIUM-ALBUTEROL 0.5-2.5 (3) MG/3ML INHALATION SOLUTION (IPRATROPIUM-ALBUTEROL) use in neb machine four times daily  Asthma  Dx J45.909  #400[Milliliter] x 3   Entered and Authorized by: [name]   Signed by: [name] on 06/26/2017   Method used: Electronically to      [id number] (retail[facility]     [address] [place], [place]  [address]     Ph: [phone] or [phone]     Fax: [phone]   RxID: [id number]  PREDNISONE 10 MG ORAL TABLET (PREDNISONE) For a flair up in your breathing problem:  4 tabs daily x 3 days, then decrease by 1 tab every other day  #30[Tablet]  x 1   Route:ORAL   Entered and Authorized by: [name]   Signed by: [name] on 06/26/2017   Method used: Electronically to      [id number] ([facility]ail[facility]     [address]     [place], [place]  [address]     Ph: [phone] or [phone]     Fax: [phone]   Note to Pharmacy: Route: ORAL;    RxID: [id number]  AZITHROMYCIN 500 MG ORAL TABLET (AZITHROMYCIN) one by mouth daily for ten days for infection  #10[Tablet] x 1   Route:ORAL   Entered and Authorized by: [name]   Signed by: [name] on 06/26/2017   Method used: Electronically to      [id number] ([facility]ail[facility]     [address] [place], [place]  [address]     Ph: [phone] or [phone]     Fax: [phone]   Note to Pharmacy: Route: ORAL;    RxID: [id number]

## 2017-08-03 ENCOUNTER — Ambulatory Visit: Admitting: Internal Medicine

## 2017-08-03 ENCOUNTER — Ambulatory Visit

## 2017-08-03 NOTE — Telephone Encounter (Signed)
Phone Note -     Pharmacy    Initial call taken by: Lorenda Hatchet,  August 03, 2017 3:15 PM  Actual Caller: [facility]  Initial Details of Call:  [facility] pharmacy needs rx for Lorazepam refaxed with providers information and signature, which was missing on original fax.  Thank you.        Medications:  LORAZEPAM 1 MG ORAL TABLET (LORAZEPAM) Take one po two times daily as needed for anxiety No cash fill  #180 x 1   Entered by: [name]   Authorized by: Lorenda Hatchet   Signed by: [name] on 08/03/2017   Method used: Print then Give to Patient   RxID: [id number]        Medications:  Rx of LORAZEPAM 1 MG ORAL TABLET (LORAZEPAM) Take one po two times daily as needed for anxiety No cash fill;  #180 x 1;  Signed;  Entered by: [name];  Authorized by: Lorenda Hatchet;  Method used: Print then Give to Patient

## 2017-10-03 ENCOUNTER — Ambulatory Visit

## 2017-10-03 NOTE — Telephone Encounter (Signed)
Phone Note -     Patient    Routine    Initial call taken by: Aniceto Boss ([facility]),  October 03, 2017 10:32 AM  Initial Details of Call:  Teena Irani. from [facility] calling for a prior authorization request for the Ipatropium Albuterol. Onalee Hua would like to speak with a clinician to answer some questions.   Best contact: [phone] Teena Irani.      Follow-up #1  Details: calling ins   this will be Medicare B v D PA  By: Theodora Blow ~ October 04, 2017 10:00 AM    Details: Medicare D denied, however Medicare B covers this with the following on face of script    DX: COPD      ICD10 CODE: J44.9    please send new script to pharmacy  By: Theodora Blow ~ October 04, 2017 3:42 PM      Medications:  IPRATROPIUM-ALBUTEROL 0.5-2.5 (3) MG/3ML INHALATION SOLUTION (IPRATROPIUM-ALBUTEROL) use in neb machine four times daily  COPD   Dx J44.9  #400 Vial[Milliliter] x 3   Entered and Authorized by: [name]   Signed by: [name] on 10/04/2017   Method used: Print then Give to Patient   RxID: [id number]  IPRATROPIUM-ALBUTEROL 0.5-2.5 (3) MG/3ML INHALATION SOLUTION (IPRATROPIUM-ALBUTEROL) use in neb machine four times daily  COPD   Dx J44.9  #400[Vial] x 3   Entered and Authorized by: [name]   Signed by: [name] on 10/04/2017   Method used: Electronically to      [id number]     [address]     [place], [place]  [address]     Ph: [phone] or [phone]     Fax: [phone]   RxID: [id number]  IPRATROPIUM-ALBUTEROL 0.5-2.5 (3) MG/3ML INHALATION SOLUTION (IPRATROPIUM-ALBUTEROL) use in neb machine four times daily  COPD   Dx J45.9  #400[Vial] x 3   Entered and Authorized by: [name]   Signed by: [name] on 10/04/2017   Method used: Electronically to      [id number]     [address]     [place], [place]  [address]     Ph: [phone] or [phone]     Fax: [phone]   RxID: [id number]        Medications:  Changed  medication from IPRATROPIUM-ALBUTEROL 0.5-2.5 (3) MG/3ML INHALATION SOLUTION (IPRATROPIUM-ALBUTEROL) use in neb machine four times daily  Asthma  Dx J45.909 to IPRATROPIUM-ALBUTEROL 0.5-2.5 (3) MG/3ML INHALATION SOLUTION (IPRATROPIUM-ALBUTEROL) use in neb machine four times daily  COPD   Dx J45.9 - Signed  Changed medication from IPRATROPIUM-ALBUTEROL 0.5-2.5 (3) MG/3ML INHALATION SOLUTION (IPRATROPIUM-ALBUTEROL) use in neb machine four times daily  COPD   Dx J45.9 to IPRATROPIUM-ALBUTEROL 0.5-2.5 (3) MG/3ML INHALATION SOLUTION (IPRATROPIUM-ALBUTEROL) use in neb machine four times daily  COPD   Dx J44.9 - Signed  Rx of IPRATROPIUM-ALBUTEROL 0.5-2.5 (3) MG/3ML INHALATION SOLUTION (IPRATROPIUM-ALBUTEROL) use in neb machine four times daily  COPD   Dx J45.9;  #400[Vial] x 3;  Signed;  Entered by: [name];  Authorized by: [name];  Method used: [phone] [address] [place], [place]  [address], Ph: [phone] or [phone], Fax: [phone]  Rx of IPRATROPIUM-ALBUTEROL 0.5-2.5 (3) MG/3ML INHALATION SOLUTION (IPRATROPIUM-ALBUTEROL) use in neb machine four times daily  COPD   Dx J44.9;  #400[Vial] x 3;  Signed;  Entered by: [name];  Authorized  by: [name];  Method used: [phone] [address] [place], [place]  [address], Ph: [phone] or [phone], Fax: [phone]  Rx of IPRATROPIUM-ALBUTEROL 0.5-2.5 (3) MG/3ML INHALATION SOLUTION (IPRATROPIUM-ALBUTEROL) use in neb machine four times daily  COPD   Dx J44.9;  #400 Vial x 3;  Signed;  Entered by: [name];  Authorized by: [name];  Method used: Print then Give to Patient

## 2017-10-04 ENCOUNTER — Ambulatory Visit

## 2017-11-22 ENCOUNTER — Ambulatory Visit

## 2017-12-24 ENCOUNTER — Ambulatory Visit

## 2018-01-07 ENCOUNTER — Ambulatory Visit

## 2018-04-24 ENCOUNTER — Ambulatory Visit

## 2018-04-30 ENCOUNTER — Ambulatory Visit

## 2018-05-16 ENCOUNTER — Ambulatory Visit

## 2018-05-30 ENCOUNTER — Ambulatory Visit

## 2018-06-05 ENCOUNTER — Ambulatory Visit

## 2018-06-05 NOTE — Telephone Encounter (Signed)
 Internal Correspondence   Initial call taken by: Garnet Koyanagi,  Jun 05, 2018 11:02 AM  Detail: [phone]  Summary of Call: Called to outreach but unable to leave a message

## 2018-06-11 ENCOUNTER — Ambulatory Visit

## 2018-06-20 ENCOUNTER — Ambulatory Visit: Admitting: Internal Medicine

## 2018-06-20 NOTE — Telephone Encounter (Signed)
 Phone Note -       Initial call taken by: [name],  Jun 20, 2018 11:14 AM    Please book appt for this patient as they are seeking refills and have not been seen > 1 year      scheduled pt for today 5/21 @140  for a PHONE visit, declined video.

## 2018-06-20 NOTE — Progress Notes (Signed)
 Primary Provider:  [name]      History of Present Illness:      78 year-old male        thyroid pill renewed yesterday    last TSH was 3.960 MCLU/ML on 05/25/2017.    in [place]   says he has lost 20 lbs in past year   on year long diet     total cholesterol is 171 with and HDL,  of 45 and an LDL,  of 102.  The triglyceride level is 120.      doing ok                 Medications Prior to this Visit  SYMBICORT 160-4.5 MCG/ACT INHALATION AEROSOL (BUDESONIDE-FORMOTEROL FUMARATE) Take two whiffs two times daily  Rinse out after use  LEVOTHYROXINE 112 MCG TABLET (LEVOTHYROXINE SODIUM) TAKE 1 TABLET BY MOUTH EVERY DAY  PRAVASTATIN SODIUM 40 MG ORAL TABLET (PRAVASTATIN SODIUM) Take one po q hs Recheck chol in 2 months  COMPAIR NEBULIZER (NEBULIZERS) Use with albuterol nebs four times daily  IPRATROPIUM-ALBUTEROL 0.5-2.5 (3) MG/3ML INHALATION SOLUTION (IPRATROPIUM-ALBUTEROL) use in neb machine four times daily  COPD   Dx J44.9  CELEBREX 200 MG ORAL CAPSULE (CELECOXIB) Take one po daily DO NOT TAKE IF TAKING INDOCIN  VENTOLIN HFA 108 (90 Base) MCG/ACT INHALATION AEROSOL SOLUTION (ALBUTEROL SULFATE) Take two whiffs q 4 hrs as needed for wheezing; Route: INHALATION  MIRTAZAPINE 15 MG ORAL TABLET (MIRTAZAPINE) take one po at hs as needed for insomnia  LORAZEPAM 1 MG ORAL TABLET (LORAZEPAM) Take one po two times daily as needed for anxiety No cash fill  CLOTRIMAZOLE-BETAMETHASONE 1-0.05 % EXTERNAL CREAM (Clotrimazole-Betamethasone) apply to affected area two times daily for fungal itching; Route: EXTERNAL  HYDROXYZINE HCL 25 MG ORAL TABLET (HYDROXYZINE HCL) Take one by mouth at hs as needed for itching; Route: ORAL  PREDNISONE 10 MG ORAL TABLET (PREDNISONE) For a flair up in your breathing problem:  4 tabs daily x 3 days, then decrease by 1 tab every other day; Route: ORAL    Current Problems- Reviewed during today's visit  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)  PREDIABETES=A1C OF 6.2 (ICD10-R73.09)  MORBID  OBESITY (ICD10-E66.01)  BMI 36-36.9 ADULT (ICD10-Z68.36)  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)  COLONIC POLYPS, HX OF (ICD10-Z86.010)  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)  ASTHMA, 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)  CARCINOMA, 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)  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)  HYPERLIPIDEMIA (ICD10-E78.5)  HYPOTHYROIDISM (ICD10-E03.9)  TESTOSTERONE 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)  ERECTILE DYSFUNCTION (ICD10-N52.9)  GOUT (ICD10-M10.9)  GERD (VWU98-J19.9)  ANXIETY (ICD10-F41.9)  VITILIGO-HANDS (ICD10-L80)  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)    Current Medications- Reviewed during today's visit  SYMBICORT 160-4.5 MCG/ACT INHALATION AEROSOL (BUDESONIDE-FORMOTEROL FUMARATE): Take two whiffs two times daily  Rinse out after use  LEVOTHYROXINE 112 MCG TABLET: TAKE 1 TABLET BY MOUTH EVERY DAY  PRAVASTATIN SODIUM 40 MG ORAL TABLET: Take one po q hs Recheck chol in 2 months  COMPAIR NEBULIZER (NEBULIZERS): Use with albuterol nebs four times daily  IPRATROPIUM-ALBUTEROL 0.5-2.5 (3) MG/3ML INHALATION SOLUTION: use in neb machine four times daily  COPD   Dx J44.9  CELEBREX 200 MG ORAL CAPSULE (CELECOXIB): Take one po daily DO NOT TAKE IF TAKING INDOCIN  VENTOLIN HFA 108 (90 Base) MCG/ACT INHALATION AEROSOL SOLUTION (ALBUTEROL SULFATE): Take two whiffs q 4 hrs as needed for wheezing  MIRTAZAPINE 15 MG ORAL TABLET: take one po at hs as needed for insomnia  LORAZEPAM 1 MG ORAL TABLET: Take one po two times daily as needed for  anxiety No cash fill  CLOTRIMAZOLE-BETAMETHASONE 1-0.05 % EXTERNAL CREAM (Clotrimazole-Betamethasone): apply to affected area two times daily for fungal itching  HYDROXYZINE HCL 25 MG ORAL TABLET: Take one by mouth at hs as needed for itching  PREDNISONE 10 MG ORAL TABLET: For a flair up in your breathing problem:  4 tabs daily x 3 days, then decrease by 1 tab every other day    Current Allergies- Reviewed during today's visit  PENICILLIN (Critical)  * CT CONTRAST (Critical)  * BELSOMRA  (Mild)    Current Directives- Reviewed during today's visit  HEALTH CARE PROXY  DISCUSED WITH PATIENT -- FULL CODE  VERBAL COREE BRAME (SON) [phone] 06/26/17  VERBAL CONSENT AUTH KIE CALVIN (DAUGHTER IN LAW) [phone] 06/26/17    Past Medical History  remotely in three different detox  colonoscopy-2005-diverticulosis and hemorrhoids  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    Surgical History  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  appendectomy age 53  s/p T and A age 88 and age 60    Family History  Father:  died age 3, cancer (oral) and cancer of colon, alcoholic  Mother:  died age 69, cancer of breast, alcoholic  Siblings:  one sister COPD, alcoholic    Social History  Marital Status:  divorced  Children: one son age 83  Lives With: girlfriend [name] in winter  Occupation: retired,   former Company secretary --retired age 35--1995     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   Eyes: Denies visual change or blurring, eye pain.   Ears/Nose/Throat: Denies earache, decreased hearing, difficulty swallowing.   Cardiovascular: Denies chest pain or pressure, palpitations, shortness of breath.   Respiratory: Denies dry cough, productive cough, shortness of breath, wheezing.   Gastrointestinal: Denies acid indigestion, nausea, vomiting, diarrhea, abdominal pain, change in bowel habits, constipation, mucous or blood in  stools.   Musculoskeletal: Denies muscle cramps or aches, muscle weakness, morning stiffness, joint pain, joint swelling.   Skin: Denies dry skin, rash, skin ulcers, suspicious lesions.   Psychiatric: Denies anxiety, depression, insomnia.     Vital Signs     Patient: 78 Years Old Male  Height:  70.6 in.  Weight: 235 lbs      Wt Chg: -18 since 06/26/2017  BMI:  33.27        35.82 on 06/26/2017  BP:  115/70     110/58 on 06/26/2017   Temp:  98.2  F      Pulse:  80         Pulse Ox: 94 %    Medications and Allergies Reviewed            Physical Exam    General:  well developed, well nourished, in no acute distress.  The patient's current weight is 235 lbs. and BMI is 33.27.    Head:      normocephalic and atraumatic.    Eyes:      PERRL/EOM intact, conjunctiva and sclera clear with out nystagmus.  sees [facility]   in past had cataract surgery in [place]   Ears:      TM's intact and clear with normal canals with grossly normal hearing.    Nose:      no deformity, discharge, inflammation, or lesions.    Mouth:      post nasal drip.      no dental issues--false teeth  Neck:      no masses, thyromegaly, or abnormal cervical nodes.  from before  Lungs:      wheezes on L and wheezes on R.  mild    from before  Heart:      non-displaced PMI, chest non-tender; regular rate and rhythm, S1, S2 without murmurs, rubs, or gallops    from before         Assessment and Plan:      ~PREDIABETES=A1C OF 6.2  needs updated labs this summer  HGBA1C:   05/25/2017 - 6.2  07/14/2016 - 5.9  01/20/2016 - 5.7   ~COPD-MOD  asking to have tapering dose of prednisone on hand  will oblige   ~HYPERLIPIDEMIA    total cholesterol is 171 with and HDL,  of 45 and an LDL,  of 102.  The triglyceride level is 120.    repeat this summer   ~HYPOTHYROIDISM    last TSH was 3.960 MCLU/ML on 05/25/2017.  repeat this summer        No over the counter medications.   Med Compliance and SE's: Pt is compliant with meds with no side effects        Medications:  PREDNISONE 10 MG ORAL TABLET (PREDNISONE) For a flair up in your breathing problem:  4 tabs daily x 3 days, then decrease by 1 tab every other day  #30[Tablet] x 1   Route:ORAL   Entered and Authorized by: [name]   Signed by: [name] on 06/20/2018   Method used: Electronically to      [id number]* (retail)     [address]     [place], [place]  [id number]     Ph: [phone]     Fax: [phone]   Note to Pharmacy: Route: ORAL;    RxID: [id number]          This real-time, interactive virtual Telehealth encounter was done by:    phone    Two patient identifiers were used and confirmed.   The patient was at home  My location: office  Others participants:          none              Their involvement:   Greater than 50% of the time was spent devoted to counseling/ coordinating care.  The patient has consented to this encounter type.  Total minutes spent:  25 min    This treatment is voluntary and a claim will be submitted to your insurance company for payment. You agree there are certain limitations in providing your care in this manner in lieu of an in person visit. Some virtual applications are not considered HIPAA compliant and therefore not secure. If a platform is not HIPAA compliant you agree to use  of this application for this visit.    Pt presents during the COVID-19 pandemic / federally declared state of public health emergency. This service conducted via (specify telephone or video).  Normal HIPAA rules have been waived and patient accepts and understands these conditions.

## 2018-07-06 ENCOUNTER — Ambulatory Visit

## 2018-07-09 ENCOUNTER — Ambulatory Visit

## 2018-07-09 NOTE — Telephone Encounter (Signed)
 Phone Note -       Call back at Ph1 [phone]  Initial call taken by: Smitty Cords,  July 09, 2018 12:41 PM  Initial Details of Call:  went to ER in [place] on 5/28.  diag with pneaumonia.  home now.  still having some trouble breathing/wheezing.   looking for presnisone.  asking for call back         Follow-up #1  Details: he was diagnosed with pneumonia on May 28th   they gave him zithromax   they did do a covid 19 test and was negative   zoom 2:40 Annice Pih  please arrange   By: Sarita Haver RN ~ July 09, 2018 12:51 PM    Details: spoke with pt and got his email. Patient was sent a zoom link for his appt today with Annice Pih at 2:40pm  Action: Phone call completed  By: Johnathan Hausen ~ July 09, 2018 12:57 PM

## 2018-07-09 NOTE — Progress Notes (Signed)
 Primary Provider:  [name]      History of Present Illness:  Justin Navarro is a 78 year old Male who presents today for: COPD exacerbation / Recent Pneumonia   Is there an updated release of information to speak for the patient on file ? yes  Specialists seen since last visit? yes  Has previsit planning and a huddle been performed on this patient? yes    This real-time, interactive virtual Telehealth encounter was done by:  Video   Two patient identifiers were used and confirmed.   The patient was at home  My location: home  Others participants:          None              Their involvement: N/A  Greater than 50% of the time was spent devoted to counseling/ coordinating care.  The patient has consented to this encounter type.    Pt presents during the COVID-19 pandemic / federally declared state of public health emergency. This service conducted via video.  Normal HIPAA rules have been waived and patient accepts and understands these conditions. Pt presents today with concerns after recent diagnosis of pneumonia. He was seen at in ED in [place] on May 28th and diagnosed with pneumonia after chest x-ray confirmation. He states being given to "broad spectrum" antibiotics via IV and discharged home. He continues to feel like his breathing is off. Has been using his inhalers and nebulizers. Denies fevers or colored sputum production. Some wheezing noted. 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)  GERD (JYN82-N56.9)  ANXIETY  (ICD10-F41.9)  HYPOTHYROIDISM (ICD10-E03.9)  CARCINOMA, 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)  VITILIGO-HANDS (ICD10-L80)    Current Medications  SYMBICORT 160-4.5 MCG/ACT INHALATION AEROSOL (BUDESONIDE-FORMOTEROL FUMARATE): Take two whiffs two times daily  Rinse out after use  LEVOTHYROXINE 112 MCG TABLET: TAKE 1 TABLET BY MOUTH EVERY DAY  PRAVASTATIN SODIUM 40 MG ORAL TABLET: Take one po q hs Recheck chol in 2 months  COMPAIR NEBULIZER (NEBULIZERS): Use with albuterol nebs four times daily  IPRATROPIUM-ALBUTEROL 0.5-2.5 (3) MG/3ML INHALATION SOLUTION: use in neb machine four times daily  COPD   Dx J44.9  CELEBREX 200 MG ORAL CAPSULE (CELECOXIB): Take one po daily DO NOT TAKE IF TAKING INDOCIN  VENTOLIN HFA 108 (90 Base) MCG/ACT INHALATION AEROSOL SOLUTION (ALBUTEROL SULFATE): Take two whiffs q 4 hrs as needed for wheezing  MIRTAZAPINE 15 MG ORAL TABLET: take one po at hs as needed for insomnia  LORAZEPAM 1 MG ORAL TABLET: Take one po two times daily as needed for anxiety No cash fill  CLOTRIMAZOLE-BETAMETHASONE 1-0.05 % EXTERNAL CREAM (Clotrimazole-Betamethasone): apply to affected area two times daily for fungal itching  HYDROXYZINE HCL 25 MG ORAL TABLET: Take one by mouth at hs as needed for itching  PREDNISONE 10 MG ORAL TABLET: 4 tabs daily for 3 days, 3 tabs daily for 3 days, 2 tabs daily for 3 days and 1 tab daily for 3 days    Current Allergies  PENICILLIN (Critical)  * CT CONTRAST (Critical)  * BELSOMRA (Mild)          Review of  Systems   General: Denies fever, chills, sweats, fatigue.   Cardiovascular: Denies chest pain or pressure, palpitations.   Respiratory: Complains of dry cough, shortness of breath, wheezing. Denies productive cough.   All other pertinent systems reviewed and are negative.    Vital Signs     Patient: 78 Years Old Male  Height:  70.6 in.  Prev Weight:  235 lbs   BMI: 33.27 on 06/20/2018   Old BP:  115/70 on 06/20/2018            Physical Exam    General:      well developed, well  nourished, in no acute distress.    Head:      normocephalic and atraumatic.    Lungs:      Unable to assess lungs. Does not appear to be in respiratory distress. No audible wheezing noted.   Psych:      alert and cooperative; normal mood and affect; normal attention span and concentration.           Assessment and Plan:      ~HX OF PNEUMONIA     ~COPD-MOD     ~ASTHMA, EXTRINSIC    Course of prednisone given for breathing changes. Possible medication side effects and drug interactions were reviewed. Proper dosing and indication for medication reviewed with patient showing understanding.   Well send for follow-up 6-8 week chest x-ray given recent pneumonia   Advised to call for: fevers, colored sputum production, or other changes in breathing  Care plan reviewed and agreed upon with patient and expect fully able to follow plan       Changes to Medication List Documented Today:   PREDNISONE 10 MG ORAL TABLET (PREDNISONE) 4 tabs daily for 3 days, 3 tabs daily for 3 days, 2 tabs daily for 3 days and 1 tab daily for 3 days; Route: ORAL      Medications:  PREDNISONE 10 MG ORAL TABLET (PREDNISONE) 4 tabs daily for 3 days, 3 tabs daily for 3 days, 2 tabs daily for 3 days and 1 tab daily for 3 days  #30[Tablet] x 1   Route:ORAL   Entered and Authorized by: Warren Danes NP   Signed by: Warren Danes NP on 07/09/2018   Method used: Electronically to      [phone]* (retail)     [address]     [place], [place]  [address]     Ph: [phone]     Fax: [phone]   Note to Pharmacy: Route: ORAL;    RxID: [id number]

## 2018-07-24 ENCOUNTER — Ambulatory Visit

## 2018-07-31 ENCOUNTER — Ambulatory Visit

## 2018-07-31 ENCOUNTER — Ambulatory Visit: Admitting: Internal Medicine

## 2018-07-31 LAB — HX COMPREHENSIVE METABOLIC PANEL
HX ALBUMIN: 3.5 g/dL (ref 3.2–5.0)
HX ALKALINE PHOSPHATASE: 100 U/L (ref 30.0–117.0)
HX ALT: 29 U/L (ref 6.0–55.0)
HX ANION GAP: 4 (ref 3.0–11.0)
HX AST: 16 U/L (ref 6.0–40.0)
HX BICARBONATE: 28 mmol/L (ref 21.0–32.0)
HX BUN: 14 mg/dL (ref 8.0–23.0)
HX CALCIUM: 9.3 mg/dL (ref 8.5–10.5)
HX CHLORIDE: 107 mmol/L (ref 98.0–110.0)
HX CREATININE: 1.06 mg/dL (ref 0.55–1.3)
HX GLOMERULAR FR AFRICAN AMERICAN: 78
HX GLOMERULAR FR NON AFRICAN AMER: 68
HX GLUCOSE: 167 mg/dL — ABNORMAL HIGH (ref 70.0–110.0)
HX POTASSIUM: 4 mmol/L (ref 3.6–5.2)
HX SODIUM: 139 mmol/L (ref 136.0–146.0)
HX TOTAL BILIRUBIN: 0.3 mg/dL (ref 0.2–1.2)
HX TOTAL PROTEIN: 6.9 g/dL (ref 6.0–8.4)

## 2018-07-31 LAB — HX ROUT.URINE WITH MICROSCOPIC
HX URINE BILIRUBIN: NEGATIVE
HX URINE BLOOD: NEGATIVE
HX URINE ESTERASE: NEGATIVE
HX URINE GLUCOSE: 50 mg/dL — AB
HX URINE KETONES: NEGATIVE
HX URINE NITRITE: NEGATIVE
HX URINE PH: 5 (ref 5.0–8.0)
HX URINE PROTEIN: 30 mg/dL — AB
HX URINE RBC: 1 /HPF (ref 0.0–2.0)
HX URINE SPECIFIC GRAVITY: >1.03 — ABNORMAL HIGH (ref 1.003–1.03)
HX URINE SQUAMOUS EPITHELIAL: 1 /HPF (ref 0.0–5.0)
HX URINE WBC: 1 /HPF (ref 0.0–5.0)
HX UROBILINOGEN, URINE: NEGATIVE

## 2018-07-31 LAB — HX MICROALBUMIN (RANDOM URINE)
HX ALBUMIN/CREAT RATIO RANDOM UR: 5.8 mg/g (ref 0.0–30.0)
HX MICROALBUMIN (RANDOM URINE): 20.7 mg/L (ref <=30.0)
HX URINE CREATININE (RANDOM): 359 mg/dL (ref 15.0–392.0)

## 2018-07-31 LAB — HX CBC W/DIFF
HX ABSOLUTE BASO COUNT AUTODIFF: 0.02 10*3/uL (ref 0.0–0.22)
HX ABSOLUTE EOS COUNT AUTODIFF: 0.37 10*3/uL (ref 0.0–0.45)
HX ABSOLUTE LYMPHS COUNT AUTODIFF: 1.68 10*3/uL (ref 0.74–5.04)
HX ABSOLUTE MONO COUNT AUTODIFF: 0.69 10*3/uL (ref 0.0–1.34)
HX ABSOLUTE NEUTRO COUNT AUTODIFF: 6.01 10*3/uL (ref 1.48–7.95)
HX BASOPHIL AUTOMATED: 0.2 %
HX EOSINOPHIL AUTOMATED: 4.2 %
HX HEMATOCRIT: 43.2 % (ref 39.0–53.0)
HX HEMOGLOBIN: 14 g/dL (ref 13.0–17.5)
HX IG AUTOMATED: 0.3 % (ref 0.0–2.0)
HX LYMPHOCYTE AUTOMATED: 19.1 %
HX MEAN CORP.HEMO.CONC.: 32.4 g/dL (ref 31.0–37.0)
HX MEAN CORPUSCULAR HEMOGLOBIN: 28.5 pg (ref 26.0–34.0)
HX MEAN CORPUSCULAR VOLUME: 88 fL (ref 80.0–100.0)
HX MEAN PLATELET VOLUME: 11.1 fL (ref 9.4–12.4)
HX MONOCYTE AUTOMATED: 7.8 %
HX NEUTROPHIL AUTOMATED: 68.4 %
HX NUCLEATED RBC %: 0 % (ref 0.0–0.0)
HX PLATELET COUNT: 186 10*3/uL (ref 150.0–400.0)
HX RED BLOOD COUNT: 4.91 10*6/uL (ref 4.2–5.9)
HX RED CELL DISTRIBUTION WIDTH SD: 42.9 fL (ref 35.0–51.0)
HX WHITE BLOOD COUNT: 8.8 10*3/uL (ref 4.0–11.0)

## 2018-07-31 LAB — HX LIPID PANEL FASTING
HX CHOLESTEROL (LIPR): 186 mg/dL (ref <=200)
HX HDL CHOLESTEROL: 68 mg/dL (ref >=40)
HX LDL CHOLESTEROL: 92 mg/dL (ref <=130)
HX TRIGLYCERIDES: 128 mg/dL (ref <=150)

## 2018-07-31 LAB — HX GLYCOHEMOGLOBIN
HX ESTIMATED AVERAGE GLUCOSE: 117 mg/dL
HX HEMOGLOBIN A1C: 5.7 % — ABNORMAL HIGH (ref <=5.6)

## 2018-07-31 LAB — HX PROSTATE SPECIFIC ANTIGEN SCR: HX PROSTATE SPECIFIC ANTIGEN SCR: 0.63 ng/mL (ref 0.0–4.0)

## 2018-07-31 LAB — HX TSH WITH REFLEX: HX TSH WITH REFLEX: 1.99 u[IU]/mL (ref 0.358–3.74)

## 2018-07-31 LAB — HX DIRECT BILIRUBIN: HX DIRECT BILIRUBIN: 0.1 mg/dL (ref 0.0–0.3)

## 2018-07-31 NOTE — Progress Notes (Signed)
 [facility] - [address]   [address]   [place], [place] [address]  Office: [phone] Fax: [phone]  To the office of the physician who has treated Justin Navarro [birth year]:  Please fax a copy of the patient?s medical records to our fax number above, for continuity of care. As a reminder, the HIPPA Privacy Rule permits you to do this without a signed release from the patient. If this is unclear, please visit the U.[place]. Department of Health & [employer] site below, which states:   Does a physician need a patient's written authorization to send a copy of the patient's medical record to a specialist or other health care provider who will treat the patient?  The HIPAA Privacy Rule permits a health care provider to disclose protected health information about an individual, without the individual?s authorization, to another health care provider for that provider?s treatment of the individual. See 45 CFR 164.506 and the definition of ?treatment? at 45 CFR 164.501.  [email or web address]  If there are any specific records requested, they will be hand-written below. Otherwise, please send all records.         If you have any questions, please call our office.     Sincerely,     The Office of Lind Covert

## 2018-07-31 NOTE — Telephone Encounter (Signed)
 Phone Note -       Initial call taken by: [name],  July 31, 2018 5:21 PM  Initial Details of Call:  CXR clear  no infiltrates no pneumonia  take meds that were issued anyways      Follow-up #1  Details: Spoke with patient and gave patient the above mentioned results.      Also, the labwork was ok, but urine shows he is dehydrated, pt. needs to drink more fluids.  Per Dr. Dagoberto Reef.      He will take the meds.    By: Mellody Life DiFraia, RN ~ August 01, 2018 9:10 AM

## 2018-07-31 NOTE — Progress Notes (Signed)
 Visit Type:  Annual Physical  Primary Provider:  [name]      History of Present Illness:  Justin Navarro is a 78 year old Male who presents today for: Annual  Is there an updated release of information to speak for the patient on file ?   Has previsit planning and a huddle been performed on this patient? yes    had pneumonia several weeks  ago  and he is not sure it is gone    was in [facility] ER for pneumonia  several weeks ago   [facility]  Address: [address] [place], [place] [address]  Phone: [phone]    asking for lorazepam fill  which is due     does not take any pneumonia vaccines    The patient's current weight is 249 lbs. and BMI is 35.25.    refuses colonoscopy update             Medications Prior to this Visit  SYMBICORT 160-4.5 MCG/ACT INHALATION AEROSOL (BUDESONIDE-FORMOTEROL FUMARATE) Take two whiffs two times daily  Rinse out after use  LEVOTHYROXINE 112 MCG TABLET (LEVOTHYROXINE SODIUM) TAKE 1 TABLET BY MOUTH EVERY DAY  PRAVASTATIN SODIUM 40 MG ORAL TABLET (PRAVASTATIN SODIUM) Take one po q hs Recheck chol in 2 months  COMPAIR NEBULIZER (NEBULIZERS) Use with albuterol nebs four times daily  IPRATROPIUM-ALBUTEROL 0.5-2.5 (3) MG/3ML INHALATION SOLUTION (IPRATROPIUM-ALBUTEROL) use in neb machine four times daily  COPD   Dx J44.9  CELEBREX 200 MG ORAL CAPSULE (CELECOXIB) Take one po daily DO NOT TAKE IF TAKING INDOCIN  VENTOLIN HFA 108 (90 Base) MCG/ACT INHALATION AEROSOL SOLUTION (ALBUTEROL SULFATE) Take two whiffs q 4 hrs as needed for wheezing; Route: INHALATION  MIRTAZAPINE 15 MG ORAL TABLET (MIRTAZAPINE) take one po at hs as needed for insomnia  LORAZEPAM 1 MG ORAL TABLET (LORAZEPAM) Take one po two times daily as needed for anxiety No cash fill  CLOTRIMAZOLE-BETAMETHASONE 1-0.05 % EXTERNAL CREAM (Clotrimazole-Betamethasone) apply to affected area two times daily for fungal itching; Route: EXTERNAL  HYDROXYZINE HCL 25 MG ORAL TABLET (HYDROXYZINE HCL) Take  one by mouth at hs as needed for itching; Route: ORAL  PREDNISONE 10 MG ORAL TABLET (PREDNISONE) 4 tabs daily for 3 days, 3 tabs daily for 3 days, 2 tabs daily for 3 days and 1 tab daily for 3 days; Route: ORAL    Current Problems- Reviewed during today's visit  HX OF PNEUMONIA (ICD10-Z87.01)  PREDIABETES=A1C OF 6.2 (ICD10-R73.09)  MORBID OBESITY (ICD10-E66.01)  BMI 36-36.9 ADULT (ICD10-Z68.36)  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)  COLONIC POLYPS, HX OF (ICD10-Z86.010)  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)  ASTHMA, 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)  CARCINOMA, 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)  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)  HYPERLIPIDEMIA (ICD10-E78.5)  HYPOTHYROIDISM (ICD10-E03.9)  TESTOSTERONE 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)  ERECTILE DYSFUNCTION (ICD10-N52.9)  GOUT (ICD10-M10.9)  GERD (UYQ03-K74.9)  ANXIETY (ICD10-F41.9)  VITILIGO-HANDS (ICD10-L80)  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)    Current Medications- Reviewed during today's visit  SYMBICORT 160-4.5 MCG/ACT INHALATION AEROSOL (BUDESONIDE-FORMOTEROL FUMARATE): Take two whiffs two times daily  rinse out with water after use  LEVOTHYROXINE 112 MCG TABLET: TAKE 1 TABLET BY MOUTH EVERY DAY  PRAVASTATIN SODIUM 40 MG ORAL TABLET: Take one po q hs Recheck chol in 2 months  COMPAIR NEBULIZER (NEBULIZERS): Use with albuterol nebs four times daily  IPRATROPIUM-ALBUTEROL 0.5-2.5 (3) MG/3ML INHALATION SOLUTION: use in neb machine four times daily  COPD   Dx J44.9  CELEBREX 200 MG ORAL CAPSULE (CELECOXIB): Take one po daily DO NOT TAKE IF TAKING  INDOCIN  VENTOLIN HFA 108 (90 Base) MCG/ACT INHALATION AEROSOL SOLUTION (ALBUTEROL SULFATE): Take two whiffs q 4 hrs as needed for wheezing  MIRTAZAPINE 15 MG ORAL TABLET: take one po at hs as needed for insomnia  LORAZEPAM 1 MG ORAL TABLET: Take one po two times daily as needed for anxiety No cash fill  CLOTRIMAZOLE-BETAMETHASONE 1-0.05 % EXTERNAL CREAM: Apply to rash two times daily  HYDROXYZINE HCL 25 MG ORAL TABLET: Take one by  mouth at hs as needed for itching  PREDNISONE 10 MG ORAL TABLET: 4 tabs daily for 3 days, 3 tabs daily for 3 days, 2 tabs daily for 3 days and 1 tab daily for 3 days  AZITHROMYCIN 500 MG ORAL TABLET: one by mouth daily for ten days for infection    Current Allergies- Reviewed during today's visit  PENICILLIN (Critical)  * CT CONTRAST (Critical)  * BELSOMRA (Mild)    Current Directives- Reviewed during today's visit  HEALTH CARE PROXY  DISCUSED WITH PATIENT -- FULL CODE  VERBAL RONDA KAZMI (SON) [phone] 06/26/17  VERBAL CONSENT AUTH TAGG EUSTICE (DAUGHTER IN LAW) [phone] 06/26/17    Past Medical History  remotely in three different detox  colonoscopy-2005-diverticulosis and hemorrhoids  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    Surgical History  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  appendectomy age 12  s/p T and A age 47 and age 69    Family History  Father:  died age 73, cancer (oral) and cancer of colon, alcoholic  Mother:  died age 49, cancer of breast, alcoholic  Siblings:  one sister COPD, alcoholic    Social History  Marital Status:  divorced  Children: one son age 21  Lives With: girlfriend Justin Navarro in winter  Occupation: retired,   former Company secretary --retired age 17--1995     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   Eyes: Denies visual change or blurring, eye pain.   Ears/Nose/Throat: Denies earache,  decreased hearing, difficulty swallowing.   Cardiovascular: Denies chest pain or pressure, palpitations, shortness of breath.   Respiratory: Denies dry cough, productive cough, shortness of breath, wheezing.   Gastrointestinal: Denies acid indigestion, nausea, vomiting, diarrhea, abdominal pain, change in bowel habits, constipation, mucous or blood in stools.   Musculoskeletal: Denies muscle cramps or aches, muscle weakness, morning stiffness, joint pain, joint swelling.   Skin: Denies dry skin, rash, skin ulcers, suspicious lesions.   Psychiatric: Denies anxiety, depression, insomnia.     Vital Signs     Patient: 78 Years Old Male  Height:  70.6 in.  Weight: 249 lbs      Wt Chg: 14 since 06/20/2018  BMI:  35.25        33.27 on  06/20/2018  BP:  139/65 left arm, large cuff, seated     115/70 on 06/20/2018   Temp:  97.5  F    oral  Pulse:  72         Pulse Ox: 94 %  On Oxygen: No    Comments: pt is here today for annual physical.   Medications and Allergies Reviewed    Signed: Majel Homer Conejos....July 31, 2018 1:03 PM            Physical Exam    General:      well developed, well nourished, in no acute distress.  The patient's current weight is 249 lbs. and BMI is 35.25.    Head:      normocephalic and atraumatic.    Eyes:      PERRL/EOM intact, conjunctiva and sclera clear with out nystagmus.  sees [facility]   in past had cataract surgery in [place]   Ears:      TM's intact and clear with normal canals with grossly normal hearing.    Nose:      no deformity, discharge, inflammation, or lesions.    Mouth:      post nasal drip.      no dental issues--false teeth  Neck:      no masses, thyromegaly, or abnormal cervical nodes.  from before  Chest Wall:      no deformities or breast masses noted.    Lungs:      Unable to assess lungs. Does not appear to be in respiratory distress. No audible wheezing noted.   Heart:      non-displaced PMI, chest non-tender; regular rate and rhythm, S1, S2 without murmurs, rubs, or  gallops      Abdomen:       normal bowel sounds; no hepatosplenomegaly no ventral,umbilical hernias or masses noted.    Prostate:      your last Prostate Specific Antigen (PSA) was 0.69 NGM/ML on 05/25/2017.    Msk:      generalized OA  Pulses:      pulses normal in all 4 extremities.    Extremities:      No edema  Neurologic:      no focal deficits, cranial nerves II-XII grossly intact with normal sensation, reflexes, coordination, muscle strength and tone.    Skin:      intact without lesions or rashes.    Cervical Nodes:      no significant adenopathy.    Axillary Nodes:      no significant adenopathy.    Inguinal Nodes:      no significant adenopathy.    Psych:      alert and cooperative; normal mood and affect; normal attention span and concentration.           Assessment and Plan:      ~HX OF PNEUMONIA     ~COPD-MOD    Order(s): Chest P/A & Lateral  gave him azithromycin and prednisone for COPD exacerbation     ~MORBID OBESITY  Discussed weight loss options at length. I recommended joining [employer], but any weight loss program can be successful if the program is followed. We reviewed keeping a food journal, weighing and measuring all foods. Once comfortable with the new lifestyle then one could consider adding in 30 minutes of excercise(brisk walking, biking, running, or swimming) daily. Advised that studies have shown that an 8% weight loss can reduce the risk of developing diabetes mellitus.      ~  HYPOTHYROIDISM  last TSH was 3.960 MCLU/ML on 05/25/2017.  repeat numbers      ~HYPERLIPIDEMIA  total cholesterol is 171 with and HDL,  of 45 and an LDL,  of 102.  The triglyceride level is 120.            No over the counter medications.   Med Compliance and SE's: Pt is compliant with meds with no side effects     Changes to Medication List Documented Today:   SYMBICORT 160-4.5 MCG/ACT INHALATION AEROSOL (BUDESONIDE-FORMOTEROL FUMARATE) Take two whiffs two times daily  rinse out with water after  use  CLOTRIMAZOLE-BETAMETHASONE 1-0.05 % EXTERNAL CREAM (CLOTRIMAZOLE-BETAMETHASONE) Apply to rash two times daily  AZITHROMYCIN 500 MG ORAL TABLET (AZITHROMYCIN) one by mouth daily for ten days for infection; Route: ORAL      Medications:  LORAZEPAM 1 MG ORAL TABLET (LORAZEPAM) Take one po two times daily as needed for anxiety No cash fill  #180 x 1   Entered and Authorized by: [name]   Signed by: [name] on 07/31/2018   Method used: Print then Give to Patient   RxID: [id number]  AZITHROMYCIN 500 MG ORAL TABLET (AZITHROMYCIN) one by mouth daily for ten days for infection  #10[Tablet] x 0   Route:ORAL   Entered and Authorized by: [name]   Signed by: [name] on 07/31/2018   Method used: Electronically to      [id number] (retail)     [address]     [place], [place]  [address]     Ph: [phone] or [phone]     Fax: [phone]   Note to Pharmacy: Route: ORAL;    RxID: [id number]  PREDNISONE 10 MG ORAL TABLET (PREDNISONE) 4 tabs daily for 3 days, 3 tabs daily for 3 days, 2 tabs daily for 3 days and 1 tab daily for 3 days  #30[Tablet] x 1   Route:ORAL   Entered and Authorized by: [name]   Signed by: [name] on 07/31/2018   Method used: Electronically to      [id number] (retail)     [address] [place], [place]  [address]     Ph: [phone] or [phone]     Fax: [phone]   Note to Pharmacy: Route: ORAL;    RxID: [id number]  CLOTRIMAZOLE-BETAMETHASONE 1-0.05 % EXTERNAL CREAM (CLOTRIMAZOLE-BETAMETHASONE) Apply to rash two times daily  #45[Gram] x 1   Entered and Authorized by: [name]   Signed by: [name] on 07/31/2018   Method used: Electronically to      [id number] (retail)     [address] [place], [place]  [address]     Ph: [phone] or [phone]     Fax: [phone]   Note to Pharmacy: Route: EXT;    RxID: [id number]  SYMBICORT 160-4.5 MCG/ACT INHALATION AEROSOL (BUDESONIDE-FORMOTEROL FUMARATE) Take  two whiffs two times daily  rinse out with water after use  #3[Inhaler] x 3   Entered and Authorized by: [name]   Signed by: [name] on 07/31/2018   Method used: Electronically to      [facility] (mail-order)     [address] [place], [place]  [address]     Ph: [phone]     Fax: [phone]   RxID: [id number]

## 2018-08-01 ENCOUNTER — Ambulatory Visit: Admitting: Internal Medicine

## 2018-08-01 NOTE — Telephone Encounter (Signed)
 Phone Note -       Initial call taken by: [name],  August 01, 2018 9:23 AM  Initial Details of Call:  last estimated gfr was 68 on 07/31/2018.  ok  specific gravity was high--must be a bit dry   push oral fluids      Follow-up #1  Details: instruct increase fluids , he verbalizes understanding  By: Sarita Haver RN ~ August 05, 2018 8:08 AM

## 2018-09-03 ENCOUNTER — Ambulatory Visit

## 2018-09-09 ENCOUNTER — Ambulatory Visit

## 2018-09-10 ENCOUNTER — Ambulatory Visit

## 2018-09-10 NOTE — Telephone Encounter (Signed)
 Phone Note -       Initial call taken by: Darden Palmer,  September 10, 2018 3:36 PM    Post Discharge Medication Reconciliation:  Reconciled current and discharge medications.  Actual Caller: Patient  Initial Details of Call:  pt booked for EGD on 8/25 @ 11:30am/10:30am  No DM, NO PM, NO BT    See ENT note-dyspahgia, trouble swallowing.  pt is leaving for [place] in oct and wants this taken care of.  Please review, booked in endo  pt aware that Covid test will be ordered        Follow-up #1  Details: Patient's medical records (problem list, medications, etc), recent laboratory tests, pertinent hospital/procedure documents reviewed.  Proceed with procedure as scheduled.    SS/GZ - does he wants sooner - like next week?  By: Mendel Corning MD ~ September 10, 2018 4:05 PM    Details: Swp confirmed appt please advise for prep   By: Michel Harrow ~ September 17, 2018 10:42 AM    Follow-up #2  Details: Disregard last message EGD mailed out prep   By: Michel Harrow ~ September 17, 2018 10:44 AM

## 2018-09-17 ENCOUNTER — Ambulatory Visit

## 2018-09-17 NOTE — Telephone Encounter (Signed)
 Phone Note -       Initial call taken by: Michel Harrow,  September 17, 2018 10:46 AM              Orders:  Added new Test order of Covid 19 [place] (COVID19TUFT[facility]) - Signed

## 2018-09-17 NOTE — Telephone Encounter (Signed)
----   Converted from flag ----  ---- 09/17/2018 11:57 AM, Romilda Garret wrote:  BOOKED 09/21/18 9AM    ---- 09/17/2018 10:46 AM, Michel Harrow wrote:  COVID-19 Drive [place] Test Order  Authorized by:  Mendel Corning MD  Ordered by:  Michel Harrow  Comments: Patient Booked For  8/25 /20W/Dr Chestine Spore Please Book 72hrs Prior      Patient: Justin Navarro  DOB: 04/14/1940  ID: [id number]  ------------------------------Phone Note -       Initial call taken by: Michel Harrow,  September 17, 2018 12:25 PM

## 2018-09-17 NOTE — Progress Notes (Signed)
 [facility] [place]   [address]   [place], [place] [address]  Office: [phone] Fax: [phone]  September 17, 2018        Justin Navarro  [address]  [place], [place]  [address]    Dear Mr.  Justin Navarro:     Justin Navarro is important information regarding your scheduled procedure.  Justin Navarro has reviewed all of your medical information available and has reserved a time for your specified appointment.  Please note that this appointment requires a time slot at the endoscopy suite, booking of nursing staff, and (depending on the type of procedure) an anesthesiologist.  Cancelling your appointment on short notice, or not showing up for the appointment, leads to unnecessary downtime and cost and results in decreased access to timely care for our patients.  Try to avoid the cancellation of your appointment, if at all possible, by making the necessary arrangements needed in order to keep your appointment.  If you have to cancel ? Please call at least 5 business days ahead of time, to allow for another patient to be schedule in that time slot.  If you cancel on shorter notice, or do not show for your appointment, you may be billed a nominal administrative fee of $150.00 to help cover some of the incurred overhead costs.  5 DAYS PRIOR TO PROCEDURE : YOU ARE TO BE ON A RESTRICTED RESIDUE** IF YOU DO NOT HEAR FROM CENTRAL SCHEDULING A WEEK PRIOR TO YOUR APPOINTMENT, PLEASE CONTACT THEM DIRECTLY AT [phone] TO SCHEDULE YOUR COVID TESTING.  FAILURE TO HAVE THIS TEST DONE IN A TIMELY MANNER WILL RESULT IN YOUR APPOINTMENT BEING CANCELLED. **   We appreciate your cooperation and understanding in this matter.  Sincerely,  [facility]

## 2018-09-17 NOTE — Progress Notes (Signed)
 [facility] [place]   [address]  [place], [place] [address]  Phone: [phone]  Fax: [phone]     September 17, 2018      Aseem Sessums  [address]  [place], [place] [address]      Dear Mr. Luecke:    Your appointment has been made for you. Please see details below.  Please bring a photo ID and your insurance card to your appointment.      Appointment Type: Endoscopy  Date: 09/24/2018   Day of Week: Tuesday  Time: 11:30 AM  Arrival Time: 10:30 AM      Appointment Details:   [facility]  [address]  [place], [place] [address]  4th Floor Endo Unit      ***Our Insurance Coordination Department will be reaching out to you 3-5 days prior to your procedure to pre-register you for your appointment. During this phone call your demographic and insurance information will be confirmed and/or updated.  At this time they will also make you aware of any financial responsibilities you may have, such as co-payment, co-insurance or deductible.   Payments can be made over the phone at the time of preregistration.  Otherwise, payment is expected at check in on the day of your procedure.     If you have not heard from a member of the Insurance Coordination Department within 48 hours of your procedure please contact them at [phone].    You may also reach out to your insurance company with any questions regarding your financial responsibility        Patient Instructions for Gastroscopy Preparation:    THE DAY BEFORE YOUR PROCEDURE:       1.  Nothing to eat/drink after midnight.       2.  If you need to take medications, you may do so with a sip of water, ONLY if you have been instructed            to do so by your physician.     IF YOU ARE TAKING BLOOD THINNERS (PLAVIX, XARELTO, OR COUMADIN), DIABETES MEDICATIONS (INSULIN OR TABLETS), HEART MEDICATIONS (DIGOXIN), OR IF YOU HAVE A PACEMAKER OR DEFIBRILLATOR (AICD):  PLEASE NOTIFY us WELL IN ADVANCE OF THE PROCEDURE TO RECEIVE SPECIFIC  INSTRUCTIONS.      All patients must make arrangements to be picked up and accompanied home by a friend or relative at time of discharge.  You will not be allowed to go home by yourself, including public transportation, or taxi.  You should NOT plan on working or driving the rest of the day due to the sedation given at the procedure.      You will be able to return home approximately 3 hours from your arrival time.      Please call the office at [phone] for any questions or concerns.         Sincerely,    Josiah Lobo, M.D.

## 2018-09-21 ENCOUNTER — Ambulatory Visit: Admitting: Internal Medicine

## 2018-09-21 ENCOUNTER — Ambulatory Visit

## 2018-09-21 LAB — HX COVID19 BY PCR CIRCLE HEALTH: HX COVID19 RESULT: NOT DETECTED

## 2018-09-23 ENCOUNTER — Ambulatory Visit: Admitting: Internal Medicine

## 2018-09-24 ENCOUNTER — Ambulatory Visit

## 2018-09-24 ENCOUNTER — Ambulatory Visit: Admitting: Internal Medicine

## 2018-09-24 NOTE — Op Note (Signed)
 OPERATIVE REPORT                Rpt # [id number]                              [facility]                                 [address]                                  [place], [place] [address]                                    [phone]  Patient:            Justin Navarro, Justin Navarro            DOB:                [birth year]  Pt. Location:       M.ENDO-M    MR #:               [id number]         Account # [id number]  Admission Date:     09/24/18   Attending M.D.:     Mendel Corning MD               Primary Care:       Lynda Rainwater MD              Report Status:      Signed     ______________________________________________________________________                                                                  OPERATIVE REPORT                   Endoscopy  Patient Name: Justin Navarro, Justin Navarro  Procedure Date: 09/24/2018 10:57 AM  MRN: [id number]  Account Number: [id number]  Date of Birth: 02/16/1940  Admit Type: Outpatient  Age: 78  Gender: Male  Note Status: Finalized  Attending MD: Nita Sickle. Chestine Spore , MD  Procedure:                    Upper GI endoscopy  Indications:                  Dysphagia  Providers:                    Arlys John T. Chestine Spore, MD  Referring MD:                 Lynda Rainwater, MD  Medicines:                    Monitored Anesthesia Care  Estimated Blood Loss:         Estimated blood loss was minimal.  Complications:                No immediate complications.  Procedure:                    After obtaining informed consent, the                                 endoscope was passed under direct                                 vision. Throughout the procedure, the                                 patient's blood pressure, pulse, and                                 oxygen saturations were monitored                                 continuously. The endoscope was                                 introduced through the mouth, and                                 advanced to the second part of duodenum.                                  The upper GI endoscopy was accomplished                                 without difficulty. The patient                                 tolerated the procedure well.  Findings:                     LA Grade B (one or more mucosal breaks                                 greater than 5 mm, not extending between                                 the tops of two mucosal folds)                                 esophagitis with no bleeding was found                                 40 cm from the incisors. Biopsies were  taken with a cold forceps for histology.                                One benign-appearing, intrinsic stenosis                                 was found 18 to 22 cm from the incisors.                                 This stenosis was moderately severe                                 (circumferential scarring or stenosis;                                 an endoscope may pass) and measured 1.4                                 cm (inner diameter) x 4 cm (in length).                                 The stenosis was traversed. A guidewire      Rpt # [id number]  [facility]     OPERATIVE REPORT                   Patient: Justin Navarro, Justin Navarro            MR #:    [id number]    __________________________________________________________________________                                   was placed and the scope was withdrawn.                                 Dilation was performed with a Savary                                 dilator with no resistance at 18 mm. The                                 dilation site was examined following                                 endoscope reinsertion and showed                                 complete resolution of luminal narrowing                                 and no perforation.  A moderate Schatzki ring was found at                                 the gastroesophageal junction.  Dilation                                 performed as above                                No other significant abnormalities were                                 identified in a careful examination of                                 the esophagus.                                A medium-sized hiatal hernia was present.                                No other significant abnormalities were                                 identified in a careful examination of                                 the stomach.                                The examined duodenum was normal.  Impression:                   - LA Grade B reflux esophagitis.                                 Biopsied.                                - Benign-appearing esophageal stenosis.                                 Dilated.                                - Moderate Schatzki ring.                                - Medium-sized hiatal hernia.                                - Normal examined duodenum.  Recommendation:               - We will contact you with the results                                 of your pathology within 1-2 weeks by                                 phone or letter or email (if you use the                                 patient portal).                                - Start pantoprazole 40mg  once daily, 30                                 minutes before breakfast                                - Tips to reduce heartburn and reflux:                                 avoid eating 3-4 hours before bedtime,                                 avoid large meals, avoid fatty food,                                 elevate the head of your bed, and avoid                                 excessive alcohol intake                                - Patient has a contact number available                                 for emergencies. The signs and symptoms                                 of potential delayed complications were                                  discussed with the patient. Return to                                 normal activities tomorrow. Written  discharge instructions were provided to                                 the patient.  Josiah Lobo, MD  Nita Sickle. Chestine Spore, MD  09/24/2018 11:56:15 AM  Dictated by:  This report has been signed electronically.  Number of Addenda: 0  Note Initiated On: 09/24/2018 10:57 AM     Rpt # [id number]  [facility]     OPERATIVE REPORT                   Patient: Justin Navarro, Justin Navarro            MR #:    [id number]    __________________________________________________________________________          I have reconciled the patient's medications based on today's        findings.                                        ELECTRONICALLY SIGNED                                       Mendel Corning, MD                                                 09/24/18    1057

## 2018-09-24 NOTE — Progress Notes (Signed)
Medications:  Added new medication of PANTOPRAZOLE SODIUM 40 MG ORAL TABLET DELAYED RELEASE (PANTOPRAZOLE SODIUM) Take one by mouth daily, 30 minutes before breakfast; Route: ORAL - Signed  Rx of PANTOPRAZOLE SODIUM 40 MG ORAL TABLET DELAYED RELEASE (PANTOPRAZOLE SODIUM) Take one by mouth daily, 30 minutes before breakfast; Route: ORAL  #90[Tablet] x 3;  Signed;  Entered by: Mendel Corning MD;  Authorized by: Mendel Corning MD;  Method used: [phone] [address] [place], [place]  [address], Ph: [phone] or [phone], Fax: [phone]; Note to Pharmacy: Route: ORAL;

## 2018-09-25 ENCOUNTER — Ambulatory Visit

## 2018-09-27 ENCOUNTER — Ambulatory Visit: Admitting: Internal Medicine

## 2018-09-27 ENCOUNTER — Ambulatory Visit

## 2018-09-27 NOTE — Telephone Encounter (Signed)
 Phone Note -       Initial call taken by: Mendel Corning MD,  September 27, 2018 7:05 AM  Initial Details of Call:  BL/CE - pls review  bx c/w barret's  advise repeat egd in 12 weeks to ensure esophagitis healing and continued adherence to ppi

## 2018-09-27 NOTE — Progress Notes (Signed)
 [facility] [place]   [address]   [place], [place] [address]  Office: [phone] Fax: [phone]      September 27, 2018    Justin Navarro  [address]   [place], [place]  [address]              Dear Orland Penman:    I just wanted to inform you of the final results from your recent upper endoscopy at [facility].  The procedure identified an area in your lower swallowing tube (i.e. esophagus) that showed changes resulting from chronic gastroesophageal reflux disease.  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    Barrett's esophagus is significant because it does confer an increased, however small, risk for developing a cancer in this area of your esophagus over time.  Undergoing a repeat endoscopy examination with sampling of the Barrett's with additional biopsies may reduce the risk for developing cancer of the esophagus; therefore, I would recommend a repeat evaluation with upper endoscopy in 12 weeks to confirm esophagitis healing and then again every 3-5 years.    Please follow-up with me in the office to discuss further and feel free to contact me should you have any questions. Thank you for allowing me to take care of your health.      Regards,    Josiah Lobo, MD  [phone]      CC: [name]

## 2018-10-01 ENCOUNTER — Ambulatory Visit

## 2018-10-02 ENCOUNTER — Ambulatory Visit: Admitting: Internal Medicine

## 2018-10-03 ENCOUNTER — Ambulatory Visit

## 2018-10-09 ENCOUNTER — Ambulatory Visit

## 2018-10-18 ENCOUNTER — Ambulatory Visit

## 2018-10-18 NOTE — Progress Notes (Signed)
 [facility] [place]   [address]   [place], [place] [address]  Office: [phone] Fax: [phone]    October 18, 2018    Kee Drudge  [address]  [place], [place]  [address]    Dear Mr.  Mabile:    This letter is to remind you that it is now time for you to call and book your Colonoscopy.    Please remember that follow-up evaluation and procedures are important to help prevent problems, including cancers. Therefore, please call our office at [phone] to schedule the above-recommended evaluation. Of course, call if you have any questions or concerns.     Sincerely,      Judyann Munson, MD      cc: [name]

## 2018-10-30 ENCOUNTER — Ambulatory Visit

## 2018-11-06 ENCOUNTER — Ambulatory Visit

## 2018-11-06 NOTE — Telephone Encounter (Signed)
 Phone Note -     Patient    Routine    Call back at [phone]  Initial call taken by: Monika Salk (Patient Access Center),  November 06, 2018 3:54 PM  Initial Details of Call:  Patient states he is filling out a form and it is asking for Dr. Karie Schwalbe email. Patient would like a call back as soon as possible.     Call back # [phone]      Follow-up #1  Details: I spoke to patient and gave him the fax number so he can request his records be sent to him in [place].   By: Gerlean Ren ~ November 06, 2018 4:26 PM

## 2018-11-19 ENCOUNTER — Ambulatory Visit

## 2018-12-03 ENCOUNTER — Ambulatory Visit

## 2018-12-03 NOTE — Telephone Encounter (Signed)
 Phone Note -     Patient    Routine    Call back at Ph1 [phone]  Initial call taken by: Lucretia Kern (Patient Access Center),  December 03, 2018 9:56 AM  Initial Details of Call:  Patient has questions where is medical records were sent to. Patient is requesting for a copy of his medical records to be mailed to his new home address in [place] at [address] [place], [place] [address]    Patient can be reached at [phone]      Follow-up #1  Details: Records have been sent to Dr per request I will send  pateint his chart summary .   By: Lind Covert ~ December 04, 2018 11:57 AM

## 2018-12-04 ENCOUNTER — Ambulatory Visit

## 2018-12-04 NOTE — Progress Notes (Signed)
 [facility] - [address]   [address]   [place], [place] [address]  Office: [phone] Fax: [phone]  Chart Summary    12/04/2018   Responsible Provider: [name]    Justin Navarro  1940/11/28  [address]                                              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9) (ICD10-E03.9)  18)  CARCINOMA, 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)  19)  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) (ICD10-L80)      Current Meds:   1)  SYMBICORT 160-4.5 MCG/ACT INHALATION AEROSOL (BUDESONIDE-FORMOTEROL FUMARATE) Take two whiffs two times daily  2)  rinse out with water after use  3)  LEVOTHYROXINE 112 MCG TABLET (LEVOTHYROXINE SODIUM) TAKE 1 TABLET BY MOUTH EVERY DAY  4)  PRAVASTATIN SODIUM 40 MG ORAL TABLET (PRAVASTATIN SODIUM) Take one po q hs Recheck chol in 2 months  5)  COMPAIR NEBULIZER (NEBULIZERS) Use with albuterol nebs four times daily  6)  IPRATROPIUM-ALBUTEROL 0.5-2.5 (3) MG/3ML INHALATION SOLUTION (IPRATROPIUM-ALBUTEROL) use in neb machine four times daily  COPD   Dx J44.9  7)  CELEBREX 200 MG ORAL CAPSULE (CELECOXIB) Take  one po daily DO NOT TAKE IF TAKING INDOCIN  8)  VENTOLIN HFA 108 (90 Base) MCG/ACT INHALATION AEROSOL SOLUTION (ALBUTEROL SULFATE) Take two whiffs q 4 hrs as needed for wheezing; Route: INHALATION  9)  MIRTAZAPINE 15 MG TABLET (MIRTAZAPINE) TAKE 1 TABLET BY MOUTH AT BEDTIME AS NEEDED FOR INSOMNIA  10)  LORAZEPAM 1 MG ORAL TABLET (LORAZEPAM) Take one po two times daily as needed for anxiety No cash fill  11)  CLOTRIMAZOLE-BETAMETHASONE 1-0.05 % EXTERNAL CREAM (CLOTRIMAZOLE-BETAMETHASONE) Apply to rash two times daily  12)  HYDROXYZINE HCL 25 MG ORAL TABLET (HYDROXYZINE HCL) Take one by mouth at hs as needed for itching; Route: ORAL  13)  PREDNISONE 10 MG ORAL TABLET (PREDNISONE) 4 tabs daily for 3 days, 3 tabs daily for 3 days, 2 tabs daily for 3 days and 1 tab daily for 3 days; Route: ORAL  14)  PANTOPRAZOLE SODIUM 40 MG ORAL TABLET DELAYED RELEASE (PANTOPRAZOLE SODIUM) Take one by mouth daily, 30 minutes before breakfast; Route: ORAL      Current Allergies   PENICILLIN, * CT CONTRAST, * BELSOMRA.

## 2018-12-17 ENCOUNTER — Ambulatory Visit: Admitting: Internal Medicine

## 2018-12-24 ENCOUNTER — Ambulatory Visit

## 2018-12-24 NOTE — Telephone Encounter (Signed)
 Phone Note -     Patient    Routine    Initial call taken by: Lauraine Rinne (Patient Access Center),  December 24, 2018 12:31 PM  Initial Details of Call:  Patient states he is in [place] and needs his prescriptions sent to [facility] pharmacy. Patient scheduled annual appintment 08/05/2019 with Dr. Dagoberto Reef at 9 am   Best call-back #: [phone]          Medications:  MIRTAZAPINE 15 MG TABLET (MIRTAZAPINE) TAKE 1 TABLET BY MOUTH AT BEDTIME AS NEEDED FOR INSOMNIA  #90[Tablet] x 2   Entered by: Lauraine Rinne   Authorized by: [name]   Signed by: [name] on 12/24/2018   Method used: [facility]      [phone]     [phone]     [place], [place]  [address]     Ph: [phone] or [phone]     Fax: [phone]   RxID: [id number]  PANTOPRAZOLE SODIUM 40 MG ORAL TABLET DELAYED RELEASE (PANTOPRAZOLE SODIUM) Take one by mouth daily, 30 minutes before breakfast  #90[Tablet] x 3   Route:ORAL   Entered by: Lauraine Rinne   Authorized by: [name]   Signed by: [name] on 12/24/2018   Method used: [facility]      [phone][facility]ail[facility]     [phone] [name]     [place], [place]  [address]     Ph: [phone] or [phone]     Fax: [phone]   Note to Pharmacy: Route: ORAL;    RxID: [id number]  CELEBREX 200 MG ORAL CAPSULE (CELECOXIB) Take one po daily DO NOT TAKE IF TAKING INDOCIN  #90[Capsule] x 3   Entered by: Lauraine Rinne   Authorized by: [name]   Signed by: [name] on 12/24/2018   Method used: [facility]      [facility] [phone][facility]ail[facility]     [phone] [name]     [place], [place]  [address]     Ph: [phone] or [phone]     Fax: [phone]   RxID: [id number]  PRAVASTATIN SODIUM 40 MG ORAL TABLET (PRAVASTATIN SODIUM) Take one po q hs Recheck chol in 2 months  #90[Tablet] x 3   Entered by: Lauraine Rinne   Authorized by: [name]   Signed by: [name] on  12/24/2018   Method used: [facility]      [id number]     [phone] [name]     [place], [place]  [address]     Ph: [phone] or [phone]     Fax: [phone]   RxID: [id number]        Medications:  Rx of PRAVASTATIN SODIUM 40 MG ORAL TABLET (PRAVASTATIN SODIUM) Take one po q hs Recheck chol in 2 months;  #90[Tablet] x 3;  Signed;  Entered by: Lauraine Rinne;  Authorized by: [name];  Method used: [facility] [phone]*, [address] [place], [place]  [address], Ph: [phone] or [phone], Fax: [phone]  Rx of CELEBREX 200 MG ORAL CAPSULE (CELECOXIB) Take one po daily DO NOT TAKE IF TAKING INDOCIN;  #90[Capsule] x 3;  Signed;  Entered by: Lauraine Rinne;  Authorized by: [name];  Method used: [facility] [phone]*, [phone] [name], [place], [place]  [address], Ph: [phone] or [phone], Fax: [phone]  Rx of PANTOPRAZOLE SODIUM 40 MG ORAL TABLET DELAYED RELEASE (PANTOPRAZOLE  SODIUM) Take one by mouth daily, 30 minutes before breakfast; Route: ORAL  #90[Tablet] x 3;  Signed;  Entered by: Lauraine Rinne;  Authorized by: [name];  Method used[phone] [address] [place], [place]  [address], Ph: [phone] or [phone], Fax: [phone]; Note to Pharmacy: Route: ORAL;  Rx of MIRTAZAPINE 15 MG TABLET (MIRTAZAPINE) TAKE 1 TABLET BY MOUTH AT BEDTIME AS NEEDED FOR INSOMNIA;  #90[Tablet] x 2;  Signed;  Entered by: Lauraine Rinne;  Authorized by: [name];  Method used: [facility] [phone]*, [phone] [name], [place], [place]  [address], Ph: [phone] or [phone], Fax: [phone]    Previous Appointment:  07/31/2018  [name]      07/31/2018  [name]      07/31/2018  [name]      07/09/2018  Warren Danes NP      06/20/2018  [name]      06/26/2017  [name]      05/25/2017  [name]       05/25/2017  [name]      08/14/2016  Clydie Braun ANP          Next Appointment: No future appointments recorded    Last BP:  139/65   (07/31/2018)    Last HGBA1C:  5.7    (07/31/2018)    BMP/CMP  Last BUN:     14    (07/31/2018),                Last EGFR:     68    (07/31/2018)  Last Creatinine:     1.06    (07/31/2018),      Last Sodium:   139    (07/31/2018)       Last Potassium:     4.0    (07/31/2018)    Last  Cholesterol:     186    (07/31/2018)     Last  LDL:     92    (07/31/2018),    Last  LDL Direct:     ()    Last  SGPT ALT:     29    (07/31/2018),  Last Albumin:     3.5    (07/31/2018)    CBC  Last WBC:     8.8    (07/31/2018)  Last Hgb:     14.0    (07/31/2018)  Last Platelet Count:     186 K/UL    (07/31/2018)    Last Digoxin Level:        ()    Last TSH:       (),  Last TSH Ultra:   1.990 MCLU/ML    (07/31/2018)      Last Urine Drug Screen: on .  Narcotic Medications:  LORAZEPAM 1 MG ORAL TABLET (LORAZEPAM)  - Started: 05/23/2005    LORAZEPAM 1 MG ORAL TABLET (LORAZEPAM)  - Started: 03/15/2006    LORAZEPAM 1 MG ORAL TABLET (LORAZEPAM)  - Started: 09/04/2011    LORAZEPAM 1 MG ORAL TABLET (LORAZEPAM)  - Started: 03/11/2012    LORAZEPAM 1 MG ORAL TABLET (LORAZEPAM)  - Started: 08/19/2012    LORAZEPAM 1 MG ORAL TABLET (LORAZEPAM)  - Started: 08/19/2012    LORAZEPAM 1 MG ORAL TABLET (LORAZEPAM)  - Started: 08/19/2012        Directives/Controlled Substance Agreement  HEALTH CARE PROXY   DISCUSED WITH  PATIENT -- FULL CODE  06/26/2017 VERBAL CONSENT MAXTEN SHULER (SON) [phone] 06/26/17  06/26/2017 VERBAL CONSENT MATIN MATTIOLI (DAUGHTER IN LAW) [phone] 06/26/17    PMP Appropriate __YES      ___No

## 2019-01-06 ENCOUNTER — Ambulatory Visit

## 2019-01-09 ENCOUNTER — Ambulatory Visit: Admitting: Internal Medicine

## 2019-02-03 ENCOUNTER — Ambulatory Visit

## 2019-02-05 ENCOUNTER — Ambulatory Visit

## 2019-02-22 ENCOUNTER — Ambulatory Visit

## 2019-03-10 ENCOUNTER — Ambulatory Visit

## 2019-03-10 NOTE — Telephone Encounter (Signed)
 Phone Note -       Initial call taken by: Johnathan C. [place],  March 10, 2019 5:31 PM  Initial Details of Call:  Pt has been scheduled for covid vaccine 1 / 2 on: 2/25 @ [facility]    I explained to the patient the importance of keeping this appointment, and they understood   the plan to do so.   This vaccine was scheduled through clockwise for the patient by another person. I have placed this note in chart for our convenience.

## 2019-04-02 ENCOUNTER — Ambulatory Visit

## 2019-04-02 NOTE — Telephone Encounter (Signed)
 Phone Note -       Initial call taken by: Renae Fickle,  April 02, 2019 1:06 PM  Initial Details of Call:  pt called stating he is having trouble swallowing- pt currently in [facility] and does not want to go to a provider in [facility] but will fly home if necessary for appt or procedure     please advise       Follow-up #1  By: Mendel Corning MD ~ April 02, 2019 1:48 PM    Details: SWP - pt stated he has been having trouble swallowing ongoing over a year but just recently within the past month it has been worsening  pt stated he has had 3 episodes of choking on food this past month  last time this happen it was with eating a hamburger  pt does not currenlty have the sensation of anything stuck in his throat   pt is able to tolerate liquids without problem  pt stated he is taking pantoprazole 40mg  1 by mouth once daily   pt also feels need to clear throat but does not have cold or post nasal drip  pt denies: fever, pain, n/v, sob, cp, lightheaded, dizziness, fatigue, malaise  pt has EGD in 08/2018 and told to repeat in 3-4 months but did not as he lives in [place] half the year  pt stated he will fly here for EGD if necessary but if OV is needed prior to EGD he will need it within the same week as he cannot keep flying here and back  pt advised if he does have an episode were he feels the food i stuck in his throat to seek emergent care  CC:BC   By: Lars Masson LPN ~ April 02, 2019 4:09 PM    Follow-up #2  Details: ok to book egd with [place] for a date that he wil be back here - as soon as he is able (will need to be back at least 3 days prior for covid testing however)    if sxs worsen before then he should seek care in [place]    would eat pureed diet until then if able  By: Mendel Corning MD ~ April 02, 2019 4:19 PM

## 2019-04-04 ENCOUNTER — Ambulatory Visit

## 2019-04-04 NOTE — Progress Notes (Signed)
 [facility] [place]   [address]   [place], [place] [address]  Office: [phone] Fax: [phone]  April 04, 2019        Justin Navarro  [address]  [place], [place]  [address]    Dear Mr.  [name]:     [name] is important information regarding your scheduled procedure.  Dr Josiah Lobo has reviewed all of your medical information available and has reserved a time for your specified appointment.  Please note that this appointment requires a time slot at the endoscopy suite, booking of nursing staff, and (depending on the type of procedure) an anesthesiologist.  Cancelling your appointment on short notice, or not showing up for the appointment, leads to unnecessary downtime and cost and results in decreased access to timely care for our patients.  Try to avoid the cancellation of your appointment, if at all possible, by making the necessary arrangements needed in order to keep your appointment.  If you have to cancel ? Please call at least 5 business days ahead of time, to allow for another patient to be schedule in that time slot.  If you cancel on shorter notice, or do not show for your appointment, you may be billed a nominal administrative fee of $150.00 to help cover some of the incurred overhead costs.  We appreciate your cooperation and understanding in this matter.  Sincerely,  [facility]

## 2019-04-04 NOTE — Progress Notes (Signed)
 [facility] [place]   [address]  [place], [place] [address]  Phone: [phone]  Fax: [phone]     April 04, 2019      Justin Navarro  [address]  [place], [place] [address]      Dear Mr. Wissmann:    Your appointment has been made for you. Please see details below.  Please bring a photo ID and your insurance card to your appointment.      Appointment Type: EGD  Facility/Provider  [facility]  [address]  [place], [place] [address]    [phone] for rescheduling    Date: 05/05/2019   Day of Week: Monday  Time: 3:45 PM  Arrival Time: 2:45 PM      Appointment Details:   [facility]  4th Floor  [address]  [place], [place] [address]  [phone]    ***Our Insurance Coordination Department will be reaching out to you 3-5 days prior to your procedure to pre-register you for your appointment. During this phone call your demographic and insurance information will be confirmed and/or updated.  At this time we will also make you aware of any financial responsibilities you may have, such as co-payment, co-insurance or deductible.      Payments can be made over the phone at the time of preregistration.  Otherwise, payment is expected at check in on the day of your procedure.     If you have not heard from a member of the Insurance Coordination Department within 48 hours of your procedure please contact them at [phone].    You may also reach out to your insurance company with any questions regarding your financial responsibility.      Patient Instructions for Gastroscopy Preparation:    THE DAY BEFORE YOUR PROCEDURE:       1.  Nothing to eat/drink after midnight.       2.  If you need to take medications, you may do so with a sip of water, ONLY if you have been instructed            to do so by your physician.     IF YOU ARE TAKING BLOOD THINNERS (PLAVIX, XARELTO, OR COUMADIN), DIABETES MEDICATIONS (INSULIN OR TABLETS), HEART MEDICATIONS  (DIGOXIN), OR IF YOU HAVE A PACEMAKER OR DEFIBRILLATOR (AICD):  PLEASE NOTIFY us WELL IN ADVANCE OF THE PROCEDURE TO RECEIVE SPECIFIC INSTRUCTIONS.      All patients must make arrangements to be picked up and accompanied home by a friend or relative at time of discharge.  You will not be allowed to go home by yourself, including public transportation, or taxi.  You should NOT plan on working or driving the rest of the day due to the sedation given at the procedure.      You will be able to return home approximately 3 hours from your arrival time.      Please call the office at [phone] for any questions or concerns.         Sincerely,    Josiah Lobo, M.D.

## 2019-04-04 NOTE — Telephone Encounter (Signed)
----   Converted from flag ----  ---- 04/04/2019 9:24 AM, Romilda Garret wrote:  BOOKED 05/02/19 12PM    ---- 04/04/2019 9:09 AM, Christian Mate wrote:  COVID-19 Drive Up Test Order  F29.021 - Exposure  Authorized by:  Karene Fry MD  Ordered by:  Christian Mate  Comments: sched egd 4/5, needs covid test 72 hrs prior    Patient: Justin Navarro  DOB: [birth year]  ID: [id number]    Race:  White  Ethnicity:  Not Hispanic or Latino  Primary Language:  English  Reason for Test: Pre-procedure  Disability:    Occupation:    First Test?    Employed in Healthcare?    Is the Patient Symptomatic?  Unknown  Date of Onset:    Was the Patient hospitalized for this Illness?    Was the Patient in the ICU?    Is Patient a Resident of a Nursing Home, Group Home, [facility] or Shelter?  ------------------------------Phone Note -       Initial call taken by: Christian Mate,  April 04, 2019 9:55 AM

## 2019-04-04 NOTE — Telephone Encounter (Signed)
 Phone Note -       Initial call taken by: Christian Mate,  April 04, 2019 9:08 AM              Orders:  Added new Test order of COVID19 by PCR [facility]) - Signed

## 2019-04-15 ENCOUNTER — Ambulatory Visit

## 2019-04-15 NOTE — Telephone Encounter (Signed)
 Phone Note -     Patient    **Informational purposes only**    Call back at [facility] [phone]  Initial call taken by: Osie Bond (Patient Access Center),  April 15, 2019 10:46 AM  Initial Details of Call:  Patient states he has been having issues with holding his urine and would like to discuss what he should do. Insurance active in [place].     Patient has been scheduled for : Follow up  Date of appointment: 05/06/2019 at 9:20am  Scheduled with: NP Hashir Deleeuw    Call back # [phone]      Follow-up #1  Details: Annice Pih they booked him for 4/6 with you, do you want him to come in for urine labs in the meantime?  By: Mellody Life DiFraia, RN ~ April 15, 2019 3:41 PM    Details: If this is an acute issue then yes. If has been going on for some time can eval in office at appointment   By: Warren Danes NP ~ April 15, 2019 3:48 PM    Follow-up #2  Details: has been having urine sxs for a couple of months now.  [place]  will see at OV in April.    By: Mellody Life DiFraia, RN ~ April 15, 2019 4:46 PM

## 2019-04-23 ENCOUNTER — Ambulatory Visit

## 2019-04-23 NOTE — Telephone Encounter (Signed)
 Phone Note -       Initial call taken by: Eugenia Mcalpine,  April 23, 2019 10:46 AM  Initial Details of Call:  LVM for pt to confirm appt date, time/arrival, transportation, prep instructions  covid test 05/02/19

## 2019-05-02 ENCOUNTER — Ambulatory Visit

## 2019-05-02 ENCOUNTER — Ambulatory Visit: Admitting: Internal Medicine

## 2019-05-03 LAB — HX COVID19 BY PCR CIRCLE HEALTH: HX COVID19 RESULT: NOT DETECTED

## 2019-05-05 ENCOUNTER — Ambulatory Visit

## 2019-05-05 ENCOUNTER — Ambulatory Visit: Admitting: Internal Medicine

## 2019-05-05 NOTE — Op Note (Signed)
 OPERATIVE REPORT                Rpt # [phone]                              [facility]                                 [address]                                  [place], [place] [address]                                    [phone]  Patient:            Justin Navarro, Justin Navarro            DOB:                06/07/2040  Pt. Location:       M.ENDO-M    MR #:               [id number]         Account # [id number]  Admission Date:     05/05/19   Attending M.D.:     Mendel Corning MD               Primary Care:       Lynda Rainwater MD              Report Status:      Signed     ______________________________________________________________________                                                                  OPERATIVE REPORT                   Endoscopy  Patient Name: Justin, Navarro  Procedure Date: 05/05/2019 8:48 AM  MRN: [id number]  Account Number: [id number]  Date of Birth: [birth year]  Admit Type: Outpatient  Age: 79  Gender: Male  Note Status: Finalized  Attending MD: Nita Sickle. Chestine Spore , MD  Procedure:                    Upper GI endoscopy  Indications:                  Dysphagia  Providers:                    Arlys John T. Chestine Spore, MD  Referring MD:                 Lynda Rainwater, MD  Medicines:                    Monitored Anesthesia Care  Estimated Blood Loss:         Estimated blood loss was minimal.  Complications:                No immediate complications.  Procedure:                    After obtaining informed consent, the                                 endoscope was passed under direct                                 vision. Throughout the procedure, the                                 patient's blood pressure, pulse, and                                 oxygen saturations were monitored                                 continuously. The endoscope was                                 introduced through the mouth, and                                 advanced to the second part of duodenum.                                  The upper GI endoscopy was accomplished                                 without difficulty. The patient                                 tolerated the procedure well.  Findings:                     There is no endoscopic evidence of                                 esophagitis in the entire esophagus.                                Two tongues of salmon-colored mucosa                                 were present from 40 to 41 cm. No other                                 visible abnormalities were present. The  maximum longitudinal extent of these                                 esophageal mucosal changes was 1 cm in                                 length. Mucosa was biopsied with a cold                                 forceps for histology in a targeted                                 manner at intervals of 1 cm from 40 to                                 41 cm from the incisors. One specimen                                 bottle was sent to pathology.                                The exam of the esophagus was otherwise      Rpt # [id number]  [facility]     OPERATIVE REPORT                   Patient: Justin Navarro, Justin Navarro            MR #:    [id number]    __________________________________________________________________________                                   normal.                                A non-obstructing Schatzki ring was                                 found in the lower third of the                                 esophagus. A guidewire was placed and                                 the scope was withdrawn. Dilation was                                 performed with a Savary dilator with no                                 resistance at 19 mm. The dilation site  was examined following endoscope                                 reinsertion and showed complete                                 resolution of  luminal narrowing and no                                 bleeding, mucosal tear or perforation.                                No other significant abnormalities were                                 identified in a careful examination of                                 the esophagus.                                A small hiatal hernia was present.                                Diffuse moderate inflammation                                 characterized by erythema was found in                                 the gastric antrum. Biopsies were taken                                 from the antrum, body and angularis to                                 evaluate for H. pylori.                                No other significant abnormalities were                                 identified in a careful examination of                                 the stomach.                                A single 3 mm sessile polyp was found in  the second portion of the duodenum. The                                 polyp was removed with a cold biopsy                                 forceps. Resection and retrieval were                                 complete.                                There was a small lipoma in the second                                 portion of the duodenum.  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                                 Biopsied.                                - Non-obstructing Schatzki ring. Dilated.                                - Small hiatal hernia.                                - Gastritis.                                - A single duodenal polyp. Resected and                                 retrieved.                                - Duodenal lipoma.  Recommendation:               - We will contact you with the results                                 of your  pathology within 1-2 weeks by                                 phone or letter or email (if you use the  patient portal).                                - Continue present medications.                                - Tips to reduce heartburn and reflux:                                 avoid eating 3-4 hours before bedtime,                                 avoid large meals, avoid fatty food,                                 elevate the head of your bed, and avoid                                 excessive alcohol intake                                - Patient has a contact number available                                 for emergencies. The signs and symptoms      Rpt # [id number]  [facility]     OPERATIVE REPORT                   Patient: Justin Navarro, Justin Navarro            MR #:    [id number]    __________________________________________________________________________                                   of potential delayed complications were                                 discussed with the patient. Return to                                 normal activities tomorrow. Written                                 discharge instructions were provided to                                 the patient.                                - Please call my office to schedule  colonoscopy for which you are overdue  Josiah Lobo, MD  Nita Sickle. Chestine Spore, MD  05/05/2019 9:19:44 AM  Dictated by:  This report has been signed electronically.  Number of Addenda: 0  Note Initiated On: 05/05/2019 8:48 AM       I have reconciled the patient's medications based on today's        findings.                                        ELECTRONICALLY SIGNED                                       Mendel Corning, MD                                                 05/05/19    [phone]

## 2019-05-06 ENCOUNTER — Ambulatory Visit

## 2019-05-06 LAB — HX ROUT.URINE WITH MICROSCOPIC
HX URINE BILIRUBIN: NEGATIVE
HX URINE BLOOD: NEGATIVE
HX URINE ESTERASE: NEGATIVE
HX URINE GLUCOSE: NEGATIVE
HX URINE KETONES: NEGATIVE
HX URINE NITRITE: NEGATIVE
HX URINE PH: 5 (ref 5.0–8.0)
HX URINE PROTEIN: NEGATIVE
HX URINE RBC: <1 (ref 0.0–2.0)
HX URINE SPECIFIC GRAVITY: 1.016 (ref 1.003–1.03)
HX URINE SQUAMOUS EPITHELIAL: <1 (ref 0.0–5.0)
HX URINE WBC: <1 (ref 0.0–5.0)
HX UROBILINOGEN, URINE: NEGATIVE

## 2019-05-06 NOTE — Telephone Encounter (Signed)
 Phone Note -       Initial call taken by: Ardeen Fillers [place],  May 06, 2019 8:18 AM  Initial Details of Call:  We are asking you to attest for your own safety and the safety of our staff.    Fever NO  SOB NO  Headaches NO  New loss of smell/taste NO  Fatigue NO  New cough(not related to chronic condition) NO  New nasal congestion or runny nose (not related to seasonal allergies) NO  Muscle aches NO  Aches and Pains NO  Sore throat NO  Diarrhea NO  Have you been tested for COVID? yes  If so, Where (facility) ? [facility]  Date? 05/02/2019  Covid Result:     Negative  Has anyone you?ve been in contact with or in your household tested positive for covid? If YES When? NO

## 2019-05-06 NOTE — Progress Notes (Signed)
 Primary Provider:  [name]      History of Present Illness:  Justin Navarro is a 79 year old Male who presents today for: Urinary hesitancy    Pt presents today with concerns regarding urinary urgency that began about 3-4 months ago. He gets up at night once to go to the bathroom. He denies dysuria, hematuria, or flank pain. He always states having a water bottle with him and drinks throughout the day. The other day he was driving and had an episode of incontinence. He is also having frequency, worse in the evening.     Aj is wheezing today but states he has not used his nebulizer today. He does not feel short of breathe.       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)  GERD (OZD66-Y40.9)  ANXIETY (ICD10-F41.9)  HYPOTHYROIDISM (ICD10-E03.9)  CARCINOMA, 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)  VITILIGO-HANDS (ICD10-L80)    Current Medications- Reviewed during today's visit  SYMBICORT 160-4.5 MCG/ACT INHALATION AEROSOL (BUDESONIDE-FORMOTEROL FUMARATE): Take two whiffs two times daily  rinse out with water after use  LEVOTHYROXINE 112 MCG TABLET: TAKE 1 TABLET BY MOUTH EVERY DAY  PRAVASTATIN SODIUM 40 MG ORAL TABLET: Take one po q hs Recheck chol in 2 months  COMPAIR NEBULIZER (NEBULIZERS): Use with albuterol nebs four times daily  IPRATROPIUM-ALBUTEROL 0.5-2.5 (3) MG/3ML INHALATION SOLUTION: use in neb machine four times daily  COPD   Dx J44.9  CELEBREX 200 MG  ORAL CAPSULE (CELECOXIB): Take one po daily DO NOT TAKE IF TAKING INDOCIN  VENTOLIN HFA 108 (90 Base) MCG/ACT INHALATION AEROSOL SOLUTION (ALBUTEROL SULFATE): Take two whiffs q 4 hrs as needed for wheezing  MIRTAZAPINE 15 MG TABLET: TAKE 1 TABLET BY MOUTH AT BEDTIME AS NEEDED FOR INSOMNIA  LORAZEPAM 1 MG ORAL TABLET: Take one po two times daily as needed for anxiety No cash fill  CLOTRIMAZOLE-BETAMETHASONE 1-0.05 % EXTERNAL CREAM: Apply to rash two times daily  PREDNISONE 10 MG ORAL TABLET: 4 tabs daily for 3 days, 3 tabs daily for 3 days, 2 tabs daily for 3 days and 1 tab daily for 3 days  PANTOPRAZOLE SODIUM 40 MG ORAL TABLET DELAYED RELEASE: Take one by mouth daily, 30 minutes before breakfast  TAMSULOSIN HCL 0.4 MG ORAL CAPSULE: 1 daily for urine    Current Allergies  PENICILLIN (Critical)  * CT CONTRAST (Critical)  * BELSOMRA (Mild)    Risk Factors  Bladder Control  issues: no  Safety concerns: no  Falls Information:  Past year - no        Review of Systems   General: Denies fever, chills, sweats.   Respiratory: Complains of wheezing. Denies shortness of breath.   Genitourinary: Complains of urinary frequency, urinary hesitancy, incontinence. Denies dysuria, hematuria, flank pain, decreased urinary stream, urethral discharge.   All other pertinent systems reviewed and are negative    Vital Signs     Patient: 79 Years Old Male  Height:  70.6 in.  Weight: 255 lbs      Wt Chg: 6 since 07/31/2018  BMI:  36.10  35.25 on 07/31/2018  BP:  134/84 left arm, large cuff, seated     139/65 on 07/31/2018   Temp:  96.49  F    temporal  Pulse:  95         Pulse Ox: 92 %  On Oxygen: No    Patient is not experiencing pain    Medications Reviewed     Signed: Ardeen Fillers [place].Marland KitchenMarland KitchenMarland KitchenApril  6, 2021 9:22 AM    PHQ 2    Over the last 2 weeks, how often have you been bothered by any of the following problems?  1. Little interest or pleasure in doing things:  0   - Not at all  2. Feeling down, depressed, or hopeless:  0   - Not  at all    Laboratory Data    Urinalysis Dipstick   Specific Gravity 1.025  Glucose negative  PH 5.5  Ketones negative  Leukocytes negative  Urobili 0.2 e.u/dl  Nitrite negative  Bilirubin negative  Protein negative  Blood negative   New Orders:  Patient Encounter [[id number]]  UAWM -Urine w Microscopic [UAWM]  UC - Urine Culture** [UC]  OV Est Level IV [CPT-99214]  UA Dipstick automated [CPT-81003]          Physical Exam    General:      well developed, well nourished, in no acute distress.    Head:      normocephalic and atraumatic.    Lungs:      wheezes on L and wheezes on R expiratory   Heart:      non-displaced PMI, chest non-tender; regular rate and rhythm, S1, S2 without murmurs, rubs, or gallops  Abdomen:       normal bowel sounds; no hepatosplenomegaly no ventral,umbilical hernias or masses noted.    Psych:      alert and cooperative; normal mood and affect; normal attention span and concentration.           Assessment and Plan:      ~BPH WITH LOWER URINARY TRACT SYMPTOMS     ~URINARY URGENCY    Urine dip in office with no concern findings. Will send for culture and analysis  We discussed lifestyle modifcation: bladder training, limiting water intake, bladder exercises etc. Handout provided  Pt to start tamsulosin. Will return for f/u at next appointment. We discussed urology referral at that time  Last Prostate Specific Antigen (PSA) was 0.63 NGM/ML on 07/31/2018.   Advised to call if symptoms worsen  Care plan reviewed and agreed upon with patient and expect fully able to follow plan.      ~COPD-MOD     ~ASTHMA, EXTRINSIC    Pt to continue use of current inhalers and nebulizer  Prednisone on hand at home. Advised to call for worsening symptoms     ~ANXIETY    Stable  Refill sent in for lorazepam. The [place] on-line prescription monitoring program was reviewed prior to refilling this prescription and no inappropriate controlled subtances were dispensed.           Medications Removed Today:    HYDROXYZINE HCL 25 MG ORAL TABLET (HYDROXYZINE HCL) Take one by mouth at hs as needed for itching; Route: ORAL    Changes to Medication List Documented Today:   TAMSULOSIN HCL 0.4 MG ORAL CAPSULE (TAMSULOSIN HCL) 1 daily for urine; Route: ORAL        The [place] on-line prescription monitoring program was reviewed prior to refilling this prescription and no inappropriate controlled  subtances were dispensed....................................................................Warren Danes NP  May 06, 2019 9:51 AM    Medications:  TAMSULOSIN HCL 0.4 MG ORAL CAPSULE (TAMSULOSIN HCL) 1 daily for urine  #30[Capsule] x 3   Route:ORAL   Entered and Authorized by: Warren Danes NP   Signed by: Warren Danes NP on 05/06/2019   Method used: Electronically to      [id number] (retail)     [address]     [place], [place]  [address]     Ph: [phone] or [phone]     Fax: [phone]   Note to Pharmacy: Route: ORAL;SAVINGS FOR NON-COVERED MEDICATIONS-For claims: [id number] [id number] Group:[id number] QI:[id number];            Questions: [employer] [phone]    RxID: [id number]  CLOTRIMAZOLE-BETAMETHASONE 1-0.05 % EXTERNAL CREAM (CLOTRIMAZOLE-BETAMETHASONE) Apply to rash two times daily  #45[Unspecified] x 1   Entered and Authorized by: Warren Danes NP   Signed by: Warren Danes NP on 05/06/2019   Method used: Electronically to      [phone] (retail)     [name]     [place], [place]  [address]     Ph: [phone] or [phone]     Fax: [phone]   Note to Pharmacy: SAVINGS FOR NON-COVERED MEDICATIONS-For claims: [id number] OEV:[id number] Group:[id number] GH:[id number]; Questions:            [employer] [id number]    RxID: [id number]  LORAZEPAM 1 MG ORAL TABLET (LORAZEPAM) Take one po two times daily as needed for anxiety No cash fill  #180[Tablet] x 0   Entered and Authorized by: Warren Danes NP   Signed by: Warren Danes NP on 05/06/2019   Method  used: Electronically to      [id number] (retail)     [address]     [place], [place]  [address]     Ph: [phone] or [phone]     Fax: [phone]   Note to Pharmacy: SAVINGS FOR NON-COVERED MEDICATIONS-For claims: [id number] QBH:[id number] Group:[id number] WI:[id number]; Questions:            [employer] [phone]    RxID: [id number]

## 2019-05-07 ENCOUNTER — Ambulatory Visit

## 2019-05-08 ENCOUNTER — Ambulatory Visit

## 2019-05-09 ENCOUNTER — Ambulatory Visit

## 2019-05-09 ENCOUNTER — Ambulatory Visit: Admitting: Internal Medicine

## 2019-05-09 NOTE — Progress Notes (Signed)
 [facility] [place]   [address]   [place], [place] [address]  Office: [phone] Fax: [phone]      May 09, 2019    Justin Navarro  [address]   [place], [place]  [address]              Dear Justin Navarro:    I just wanted to be sure you received the final results from your recent upper endoscopy at [facility]. As you know, polyp tissue was removed from your large intestine at the time of the procedure.  As expected the polyp tissue was benign, i.e., non-cancerous. More specifically the type of polyp removed is referred to as `adenoma`. This type of polyp, when not removed can grow larger over many years and has the potential to turn into a colon cancer. There is no longer a significant risk for polyps after they are removed; however, given the type and and location of polyp removed, we recommend a repeat upper endoscopy in 3 years to be sure that you have not developed any new polyps.        Please feel free to contact me should you have any questions and thank you for allowing me to take care of your health.      Regards,    Josiah Lobo, MD  [phone]      CC: [name]

## 2019-05-09 NOTE — Telephone Encounter (Signed)
 Phone Note -     Patient    **Informational purposes only**    Call back at [facility] 781 [phone]  Initial call taken by: Veneta Penton (Patient Access Center),  May 09, 2019 12:50 PM  Actual Caller: Patient  Initial Details of Call:  Patient called in stating he was at the doctor's office the other day. Patient stated Dr. Dagoberto Reef prescribed him 3 medications, he only received 2 of them. Patient would like to have the Lorazepam 1 mg to send to the pharmacy. Patient was told the medication was sent yesterday 4/8> patient stated he will check with the pharmacy and call us back. Please note    [id number]  [phone] [facility] [place], [place]  [address]   Phone: [phone]    Phone (Alt): [phone]    Fax: [phone]      Call back # [phone]      Follow-up #1  Details: can someone tell this pt. the lorazapam was sent yesterday.  (If pharm. doesn't get it, put another refill in and send to Dr. Murlean Caller).    By: Mellody Life DiFraia, RN ~ May 09, 2019 2:25 PM    Details: called pt and pt stated that he already went and picked it up after he called the office   By: Kyung Bacca ~ May 09, 2019 3:04 PM

## 2019-06-24 ENCOUNTER — Ambulatory Visit

## 2019-06-24 ENCOUNTER — Ambulatory Visit: Admitting: Internal Medicine

## 2019-06-24 NOTE — Progress Notes (Signed)
 Primary Provider:  [name]      History of Present Illness:      79 year-old male   on protonix once daily      he is in [place]        I can double the protonix    already sees stomach doctor Dr Kemper Durie  he already had endoscopy this year    is due for colonoscopy    I called Dr Ronelle Nigh office and they will call patient   and talk about the protonix 40mg m by mouth two times daily    They will also book colonoscopy       Medications Prior to this Visit  SYMBICORT 160-4.5 MCG/ACT INHALATION AEROSOL (BUDESONIDE-FORMOTEROL FUMARATE) Take two whiffs two times daily  rinse out with water after use  LEVOTHYROXINE 112 MCG TABLET (LEVOTHYROXINE SODIUM) TAKE 1 TABLET BY MOUTH EVERY DAY  PRAVASTATIN SODIUM 40 MG ORAL TABLET (PRAVASTATIN SODIUM) Take one po q hs Recheck chol in 2 months  COMPAIR NEBULIZER (NEBULIZERS) Use with albuterol nebs four times daily  IPRATROPIUM-ALBUTEROL 0.5-2.5 (3) MG/3ML INHALATION SOLUTION (IPRATROPIUM-ALBUTEROL) use in neb machine four times daily  COPD   Dx J44.9  CELEBREX 200 MG ORAL CAPSULE (CELECOXIB) Take one po daily DO NOT TAKE IF TAKING INDOCIN  VENTOLIN HFA 108 (90 Base) MCG/ACT INHALATION AEROSOL SOLUTION (ALBUTEROL SULFATE) Take two whiffs q 4 hrs as needed for wheezing; Route: INHALATION  MIRTAZAPINE 15 MG TABLET (MIRTAZAPINE) TAKE 1 TABLET BY MOUTH AT BEDTIME AS NEEDED FOR INSOMNIA  LORAZEPAM 1 MG ORAL TABLET (LORAZEPAM) Take one po two times daily as needed for anxiety No cash fill  CLOTRIMAZOLE-BETAMETHASONE 1-0.05 % EXTERNAL CREAM (CLOTRIMAZOLE-BETAMETHASONE) Apply to rash two times daily  PREDNISONE 10 MG ORAL TABLET (PREDNISONE) 4 tabs daily for 3 days, 3 tabs daily for 3 days, 2 tabs daily for 3 days and 1 tab daily for 3 days; Route: ORAL  PANTOPRAZOLE SODIUM 40 MG ORAL TABLET DELAYED RELEASE (PANTOPRAZOLE SODIUM) Take one by mouth daily, 30 minutes before breakfast; Route: ORAL  TAMSULOSIN HCL 0.4 MG ORAL CAPSULE (TAMSULOSIN HCL) 1 daily for urine; Route:  ORAL    Current Problems- Reviewed during today's visit  GERD (GASTROESOPHAGEAL REFLUX DISEASE) (ICD10-K21.9)  URINARY URGENCY (ICD10-R39.15)  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)  HX OF PNEUMONIA (ICD10-Z87.01)  PREDIABETES=A1C OF 6.2 (ICD10-R73.09)  MORBID OBESITY (ICD10-E66.01)  BMI 36-36.9 ADULT (ICD10-Z68.36)  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)  COLONIC POLYPS, HX OF (ICD10-Z86.010)  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)  ASTHMA, 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)  CARCINOMA, 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)  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)  HYPERLIPIDEMIA (ICD10-E78.5)  HYPOTHYROIDISM (ICD10-E03.9)  TESTOSTERONE 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)  ERECTILE DYSFUNCTION (ICD10-N52.9)  GOUT (ICD10-M10.9)  GERD (VWU98-J19.9)  ANXIETY (ICD10-F41.9)  VITILIGO-HANDS (ICD10-L80)  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)    Current Medications- Reviewed during today's visit  SYMBICORT 160-4.5 MCG/ACT INHALATION AEROSOL (BUDESONIDE-FORMOTEROL FUMARATE): Take two whiffs two times daily  rinse out with water after use  LEVOTHYROXINE 112 MCG TABLET: TAKE 1 TABLET BY MOUTH EVERY DAY  PRAVASTATIN SODIUM 40 MG ORAL TABLET: Take one po q hs Recheck chol in 2 months  COMPAIR NEBULIZER (NEBULIZERS): Use with albuterol nebs four times daily  IPRATROPIUM-ALBUTEROL 0.5-2.5 (3) MG/3ML INHALATION SOLUTION: use in neb machine four times daily  COPD   Dx J44.9  CELEBREX 200 MG ORAL CAPSULE (CELECOXIB): Take one po daily DO NOT TAKE IF TAKING INDOCIN  VENTOLIN HFA 108 (90 Base) MCG/ACT INHALATION AEROSOL SOLUTION (ALBUTEROL SULFATE): Take two whiffs q 4 hrs as needed for wheezing  MIRTAZAPINE 15 MG TABLET: TAKE 1 TABLET BY MOUTH AT BEDTIME AS NEEDED FOR INSOMNIA  LORAZEPAM 1 MG ORAL TABLET: Take one po two times daily as needed for anxiety No cash fill  CLOTRIMAZOLE-BETAMETHASONE 1-0.05 % EXTERNAL CREAM: Apply to rash two times daily  PREDNISONE 10 MG ORAL TABLET: 4 tabs daily for 3 days, 3 tabs daily for 3 days, 2 tabs daily for 3  days and 1 tab daily for 3 days  PANTOPRAZOLE SODIUM 40 MG ORAL TABLET DELAYED RELEASE: Take one by mouth two times daily for swallowing  TAMSULOSIN HCL 0.4 MG ORAL CAPSULE: 1 daily for urine    Current Allergies- Reviewed during today's visit  PENICILLIN (Critical)  * CT CONTRAST (Critical)  * BELSOMRA (Mild)    Current Directives- Reviewed during today's visit  HEALTH CARE PROXY  DISCUSED WITH PATIENT -- FULL CODE  VERBAL CONSENT KATHLEEN LIKINS (SON) [phone] 06/26/17  VERBAL CONSENT AUTH DETAVIOUS RINN (DAUGHTER IN LAW) [phone] 06/26/17    Past Medical History  remotely in three different detox  colonoscopy-2005-diverticulosis and hemorrhoids  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    Surgical History  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  appendectomy age 38  s/p T and A age 79 and age 89    Family History  Father:  died age 7, cancer (oral) and cancer of colon, alcoholic  Mother:  died age 61, cancer of breast, alcoholic  Siblings:  one sister COPD, alcoholic    Social History  Marital Status:  divorced  Children: one son age 26  Lives With: girlfriend [name] in winter  Occupation: retired,   former Company secretary --retired age 56--1995     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   Eyes: Denies visual change or blurring, eye pain.   Ears/Nose/Throat: Denies earache, decreased hearing, difficulty swallowing.    Cardiovascular: Denies chest pain or pressure, palpitations, shortness of breath.   Respiratory: Denies dry cough, productive cough, shortness of breath, wheezing.   Gastrointestinal: Denies acid indigestion, nausea, vomiting, diarrhea, abdominal pain, change in bowel habits, constipation, mucous or blood in stools.   Musculoskeletal: Denies muscle cramps or aches, muscle weakness, morning stiffness, joint pain, joint swelling.   Skin: Denies dry skin, rash, skin ulcers, suspicious lesions.   Psychiatric: Denies anxiety, depression, insomnia.     Vital Signs     Patient: 79 Years Old Male  Height:  70.6 in.  Prev Weight:  255 lbs   BMI: 36.10 on 05/06/2019   Old BP:  134/84 on 05/06/2019    Medications and Allergies Reviewed            Physical Exam    General:      The patient's current weight  is 255 lbs. and BMI is 36.10.    Head:      normocephalic and atraumatic.    Eyes:      PERRL/EOM intact, conjunctiva and sclera clear with out nystagmus.  sees [facility]   in past had cataract surgery in [place]   Mouth:      post nasal drip.      no dental issues--false teeth  Lungs:      wheezes on L and wheezes on R expiratory   from before  Heart:      non-displaced PMI, chest non-tender; regular rate and rhythm, S1, S2 without murmurs, rubs, or gallops         Assessment and Plan:      ~COPD-MOD    patient is on max therapy  he wants pulmonary doctor care now   gave Dr Dianne Dun office a referral call  and they will book him  on DUONEB and SYMBICORT   ~GERD (GASTROESOPHAGEAL REFLUX DISEASE)    feels like there is some problem swallowing but just   had endoscopy with Dr Kemper Durie   I increased the protonix to 40 mg by mouth two times daily  and gave telephone call to Dr Kemper Durie to call him   We can always do ENT or swallowing eval         No over the counter medications.   Med Compliance and SE's: Pt is compliant with meds with no side effects     Changes to Medication List Documented Today:   PANTOPRAZOLE SODIUM 40 MG  ORAL TABLET DELAYED RELEASE (PANTOPRAZOLE SODIUM) Take one by mouth two times daily for swallowing; Route: ORAL      Medications:  PANTOPRAZOLE SODIUM 40 MG ORAL TABLET DELAYED RELEASE (PANTOPRAZOLE SODIUM) Take one by mouth two times daily for swallowing  #180[Tablet] x 3   Route:ORAL   Entered and Authorized by: [name]   Signed by: [name] on 06/24/2019   Method used: Electronically to      [id number]     [address]     [place], [place]  [address]     Ph: [phone] or [phone]     Fax: [phone]   Note to Pharmacy: Route: ORAL;SAVINGS FOR NON-COVERED MEDICATIONS-For claims: [id number] [id number] Group:[id number] [id number];            Questions: [employer] [phone]    RxID: [id number]          Total time spent (excluding billable services): 25 minutes.      Preparing to see the patient.      Time the provider spends taking a patient's history.      Performing a medically appropriate exam and/or evaluation.      Counseling and educating the patient/family/caregiver.      Ordering medications, tests, or procedures.      Referring and communicating with other healthcare professionals.      Time spent documenting clinical information.      Independent interpretation of results and communicating results to the patient/family/caregiver and Care coordination.

## 2019-06-24 NOTE — Telephone Encounter (Signed)
 Phone Note -       Initial call taken by: Kem Kays RN,  Jun 24, 2019 4:15 PM  Initial Details of Call:  Dr Dagoberto Reef called states seen pt today; pt c/o still having some difficulty swallowing.  He increased Pt 's pantoprazole 40mg   to two times daily and advised to f/u with Dr Chestine Spore for further rec     Also Pt had colo done 25yrs ago with polyps removed and due for colo     CC: BC        Follow-up #1  Details: pls schedule OV for follow-up to discuss  By: Mendel Corning MD ~ Jun 24, 2019 4:49 PM    Action: Phone call completed, Appointment scheduled  By: Carlos Levering ~ July 02, 2019 4:16 PM

## 2019-06-25 ENCOUNTER — Ambulatory Visit: Admitting: Internal Medicine

## 2019-06-25 ENCOUNTER — Ambulatory Visit

## 2019-06-25 NOTE — Telephone Encounter (Signed)
 Phone Note -       Initial call taken by: [name],  Jun 25, 2019 4:17 PM    Booking CT and renewing doxy    Medications:  DOXYCYCLINE MONOHYDRATE 100 MG ORAL CAPSULE (DOXYCYCLINE MONOHYDRATE) take one capsule by mouth twice daily  for infection  #20[Capsule] x 0   Route:ORAL   Entered and Authorized by: [name]   Signed by: [name] on 06/25/2019   Method used: Electronically to      [id number]     [address] [place], [place]  [address]     Ph: [phone] or [phone]     Fax: [phone]   Note to Pharmacy: Route: ORAL;    RxID: [id number]        Problems:  Added new problem of PULMONARY INFILTRATES (ICD-793.19) (ICD10-R91.8)  Medications:  Added new medication of DOXYCYCLINE MONOHYDRATE 100 MG ORAL CAPSULE (DOXYCYCLINE MONOHYDRATE) take one capsule by mouth twice daily  for infection; Route: ORAL - Signed  Rx of DOXYCYCLINE MONOHYDRATE 100 MG ORAL CAPSULE (DOXYCYCLINE MONOHYDRATE) take one capsule by mouth twice daily  for infection; Route: ORAL  #20[Capsule] x 0;  Signed;  Entered by: [name];  Authorized by: [name];  Method used: [phone] [address] [place], [place]  [address], Ph: [phone] or [phone], Fax: [phone]; Note to Pharmacy: Route: ORAL;  Orders:  Added new Test order of CT - Chest (CT-CHST) - Signed

## 2019-06-27 ENCOUNTER — Ambulatory Visit

## 2019-06-30 ENCOUNTER — Ambulatory Visit

## 2019-06-30 ENCOUNTER — Ambulatory Visit: Admitting: Physician Assistant

## 2019-06-30 ENCOUNTER — Ambulatory Visit: Admitting: Internal Medicine

## 2019-07-01 ENCOUNTER — Ambulatory Visit: Admitting: Internal Medicine

## 2019-07-01 ENCOUNTER — Ambulatory Visit

## 2019-07-01 NOTE — Progress Notes (Signed)
 Problems:  Added new problem of LUNG MASS, RIGHT LOWER LOBE (RLL) (ICD-786.6) (ICD10-J98.4)

## 2019-07-01 NOTE — Telephone Encounter (Signed)
 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

## 2019-07-07 ENCOUNTER — Ambulatory Visit

## 2019-07-07 NOTE — Telephone Encounter (Signed)
 Phone Note -       Call back at [phone]  Initial call taken by: Samara Deist A. DiFraia, RN,  July 07, 2019 2:04 PM  Initial Details of Call:  lmtcb- Application for social security card. He needs a letter stating he is alive.  SS card forms scanned in - he filled them out - nothing for [place] to fill out - he will be given the forms back.      There's a note he needs records from past 6 months- will give to front desk to get the records for the patient.        Follow-up #1  Details: Patient returned call.    Call back # [phone]  By: Jasper Riling (Patient Access Center) ~ July 08, 2019 10:33 AM    Details: spoke with IR at [facility]- pt. has OV today for the IR consult- spoke w/ Shawna Orleans, has OV today at 3:30pm  By: Mellody Life. DiFraia, RN ~ July 08, 2019 10:44 AM    Follow-up #2  Details: told pt. will call him when everything is ready for pickup- he has to submit everything to [facility] admin.  By: Mellody Life DiFraia, RN ~ July 08, 2019 10:45 AM    Details: above noted   letter signed    By: [name] ~ July 08, 2019 12:34 PM

## 2019-07-08 ENCOUNTER — Ambulatory Visit

## 2019-07-08 ENCOUNTER — Ambulatory Visit: Admitting: Internal Medicine

## 2019-07-08 LAB — HX CBC W/DIFF
HX ABSOLUTE BASO COUNT AUTODIFF: 0.02 10*3/uL (ref 0.0–0.22)
HX ABSOLUTE EOS COUNT AUTODIFF: 0.28 10*3/uL (ref 0.0–0.45)
HX ABSOLUTE LYMPHS COUNT AUTODIFF: 1.34 10*3/uL (ref 0.74–5.04)
HX ABSOLUTE MONO COUNT AUTODIFF: 0.55 10*3/uL (ref 0.0–1.34)
HX ABSOLUTE NEUTRO COUNT AUTODIFF: 2.85 10*3/uL (ref 1.48–7.95)
HX BASOPHIL AUTOMATED: 0.4 %
HX EOSINOPHIL AUTOMATED: 5.5 %
HX HEMATOCRIT: 43.6 % (ref 39.0–53.0)
HX HEMOGLOBIN: 14 g/dL (ref 13.0–17.5)
HX IG AUTOMATED: 0.2 % (ref 0.0–2.0)
HX LYMPHOCYTE AUTOMATED: 26.5 %
HX MEAN CORP.HEMO.CONC.: 32.1 g/dL (ref 31.0–37.0)
HX MEAN CORPUSCULAR HEMOGLOBIN: 28.5 pg (ref 26.0–34.0)
HX MEAN CORPUSCULAR VOLUME: 88.6 fL (ref 80.0–100.0)
HX MEAN PLATELET VOLUME: 10.6 fL (ref 9.4–12.4)
HX MONOCYTE AUTOMATED: 10.9 %
HX NEUTROPHIL AUTOMATED: 56.5 %
HX PLATELET COUNT: 179 10*3/uL (ref 150.0–400.0)
HX RED BLOOD COUNT: 4.92 M/uL (ref 4.2–5.9)
HX RED CELL DISTRIBUTION WIDTH SD: 44.5 fL (ref 35.0–51.0)
HX WHITE BLOOD COUNT: 5.1 10*3/uL (ref 4.0–11.0)

## 2019-07-08 LAB — HX BASIC METABOLIC PANEL
HX ANION GAP: 3 mmol/L (ref 3.0–11.0)
HX BICARBONATE: 30 mmol/L (ref 21.0–32.0)
HX BUN: 15 mg/dL (ref 7.0–23.0)
HX CALCIUM: 9 mg/dL (ref 8.5–10.5)
HX CHLORIDE: 105 mmol/L (ref 98.0–110.0)
HX CREATININE: 1.11 mg/dL (ref 0.4–1.3)
HX GLOMERULAR FR AFRICAN AMERICAN: 78
HX GLOMERULAR FR NON AFRICAN AMER: 64
HX GLUCOSE: 95 mg/dL (ref 70.0–110.0)
HX POTASSIUM: 4.3 mmol/L (ref 3.6–5.2)
HX SODIUM: 138 mmol/L (ref 136.0–146.0)

## 2019-07-08 LAB — HX PROTHROMBIN TIME
HX INR: 1
HX PROTHROMBIN TIME: 10.9 s (ref 9.4–11.4)

## 2019-07-08 NOTE — Telephone Encounter (Signed)
 Phone Note -       Initial call taken by: Mellody Life. DiFraia, RN,  July 08, 2019 2:12 PM  Initial Details of Call:  telling pt. the letter for social security is done, he is going to come and pick it up- he said he needs to get a new security card.  Told pt we will give him the letter, the forms, he signed a medical release and will get the last 6 month OV notes - he will have to submit all of this himself.    SS

## 2019-07-08 NOTE — Progress Notes (Signed)
 [facility] - [address]   [address]   [place], [place] [address]  Office: [phone] Fax: [phone]      07/08/2019      Regarding: Social Security Card  [place]  DOB: May 14, 1940  [address]  [place], [place]  [address]      To Whom It May Concern:    The above patient is currently under my care.  The patient's last office visit was 06/24/2019.    If you need any further information, please do not hesitate to contact me.          Sincerely,          Dr. [name]

## 2019-07-11 ENCOUNTER — Ambulatory Visit

## 2019-07-11 NOTE — Telephone Encounter (Signed)
 Phone Note -     Patient    Routine    Call back at Ph1 [phone]  Initial call taken by: Link Snuffer,  July 11, 2019 11:25 AM  Initial Details of Call:  LM to confirm 6.14.21 appt

## 2019-07-14 ENCOUNTER — Ambulatory Visit

## 2019-07-14 NOTE — Progress Notes (Signed)
 Primary Provider:  [name]      History of Present Illness:  Justin Navarro is a 79 Year Old Male who is here today for a NP consult. He was referred by Dr Justin Navarro. Pt notes he has been having shortness of breath on exertion for 25 years. His breathing has been fine at rest. Denies any fever or chills. He notes he has a cough, occasionally has discolored phlegm. pt has hx of smoking, smoked about 2-3PPD for 25 years. Recent CT reviewed today. Pt has symbicort and rescue inhaler, uses it a couple times a day. Justin Navarro a course of doxy 20 days ago.          CT 07/01/19      IMPRESSION:       1.  Subpleural right lower lobe spiculated mass measuring 1.3 x 1.1 x      1.3 cm.      2.  2. Peribronchial thickening in the left upper lobe associated with      ill-defined masslike opacity measuring 1.3 x 1.1 x 1.4 cm thick distal      the postinfectious or inflammatory in etiology but given the presence      of a masslike opacity in the right lower lobe, a second focus of      neoplastic change cannot be excluded.            Review of Systems   General: Denies fever, chills, sweats, anorexia, fatigue, weakness, malaise, weight loss.   Eyes: Denies visual change or blurring, eye pain.   Ears/Nose/Throat: Denies earache, decreased hearing, difficulty swallowing.   Cardiovascular: Denies chest pain or pressure, palpitations, shortness of breath.   Respiratory: Complains of productive cough, shortness of breath. Denies dry cough, wheezing.   Gastrointestinal: Denies acid indigestion, nausea, vomiting, diarrhea, abdominal pain, change in bowel habits, constipation, mucous or blood in stools.   Musculoskeletal: Denies muscle cramps or aches, muscle weakness, morning stiffness, joint pain, joint swelling.   Skin: Denies dry skin, rash, skin ulcers, suspicious lesions.   Neurologic: Denies memory loss, parasthesias, dizziness, headaches, transient weakness.   Psychiatric: Denies anxiety, depression, insomnia.     All other  pertinent systems were reviewed and are negative   Vital Signs     Patient: 79 Years Old Male  Height:  70.6 in.  Weight: 260 lbs      Wt Chg: 5 since 05/06/2019  BMI:  36.81        36.10 on 05/06/2019  BP:  128/80 right arm, normal cuff, seated     134/84 on 05/06/2019   Temp:  97.11  F      Pulse:  74         Pulse Ox: 95 %    Patient is not experiencing pain  Are you having trouble paying for your medications?  No    Medications and Allergies Reviewed    Signed: Leotis Pain Navarro....July 14, 2019 11:04 AM          Physical Exam    General:      well developed, well nourished, in no acute distress.    Head:      normocephalic and atraumatic.    Neck:      no masses, thyromegaly, or abnormal cervical nodes.    Lungs:      wheezes on L and wheezes on R.    Heart:      non-displaced PMI, chest non-tender; regular rate and rhythm, S1, S2  without murmurs, rubs, or gallops  Abdomen:       normal bowel sounds; no hepatosplenomegaly no ventral,umbilical hernias or masses noted.    Msk:      no deformity or scoliosis noted of thoracic or lumbar spine.    Extremities:      no clubbing, cyanosis, edema, or deformity noted with normal full range of motion of all joints.    Neurologic:      no focal deficits, cranial nerves II-XII grossly intact with normal sensation, reflexes, coordination, muscle strength and tone.    Psych:      alert and cooperative; normal mood and affect; normal attention span and concentration.           Assessment and Plan:      ~LUNG MASS, RIGHT LOWER LOBE (RLL) (J98.4)    ~COPD: ACUTE EXACERBATION (J44.1)    ~DYSPNEA ON EXERTION (R06.09)    ~ABNORMAL CHEST CT (R91.8)   ~LUNG NODULE (R91.8)      SOB on exertion for about 25 years with a cough. Denies any fever or chills. Pt has symbicort and rescue inhaler, uses it a couple times a day. Will prescribe a course of prednisone. Finished a course of doxy 20 days ago.  Pt has hx of smoking, smoked about 2-3PPD for 25 years. Recent CT reviewed with pt today,  discussed having a PET CT.   Pt has a lung biopsy scheduled this week. Pt has a lot of wheezing, I will discuss this with Dr. So before continuing his procedure.     continue symbicort, rescue inhaler as needed  will prescribe a course of prednisone and abx, if you experience any side effects with abx, d/c. pt took ceftin before without issues.   PET CT ordered  pt is scheduled to have an IR biopsy.   f/u in 2 weeks    CT 07/01/19      IMPRESSION:       1.  Subpleural right lower lobe spiculated mass measuring 1.3 x 1.1 x      1.3 cm.      2.  2. Peribronchial thickening in the left upper lobe associated with      ill-defined masslike opacity measuring 1.3 x 1.1 x 1.4 cm thick distal      the postinfectious or inflammatory in etiology but given the presence      of a masslike opacity in the right lower lobe, a second focus of      neoplastic change cannot be excluded.                Problems Reviewed  Med Compliance and SE's: Pt is compliant with meds with no side effects   Patient/Caregiver understand instructions and plan.    Changes to Medication List Documented Today:   PREDNISONE 10 MG ORAL TABLET (PREDNISONE) 4 tabs daily x 3 days, then 3 tabs daily x 3 days, then 2 tabs daily x 3 days, then 1 tab daily x 3 days; Route: ORAL  CEFPODOXIME PROXETIL 100 MG ORAL TABLET (CEFPODOXIME PROXETIL) 100mg  by mouth twice daily for 7 days.; Route: ORAL  Orders:   Added new Service order of Patient Encounter ([id number]) - Signed  Added new Test order of CT - CHEST/THORAX WITHOUT CONTRAST (CTCHWO     ) - Signed        Medications:  CEFPODOXIME PROXETIL 100 MG ORAL TABLET (CEFPODOXIME PROXETIL) 100mg  by mouth twice daily for 7 days.  #14[Tablet] x 0   Route:ORAL  Entered and Authorized by: Justin Cousin MD   Signed by: Justin Cousin MD on 07/14/2019   Method used: [facility]ically [facility]      [id number]  [address]  [place], [place]  [address]  Ph: [phone] or [phone]  Fax: [phone]   Note to Pharmacy: Route: ORAL;     RxID: [id number]  PREDNISONE 10 MG ORAL TABLET (PREDNISONE) 4 tabs daily x 3 days, then 3 tabs daily x 3 days, then 2 tabs daily x 3 days, then 1 tab daily x 3 days  #30[Tablet] x 0   Route:ORAL   Entered and Authorized by: Justin Cousin MD   Signed by: Justin Cousin MD on 07/14/2019   Method used: Electronically to      [id number]  [address]  [place], [place]  [address]  Ph: [phone] or [phone]  Fax: [phone]   Note to Pharmacy: Route: ORAL;    RxID: [id number]        By signing my name below, I, Angelica K. Sou, attest that this documentation has been prepared under the direction and in the presence of Justin Cousin MD.   Electronically Signed: Angelica K. Sou  July 14, 2019 11:11 AM    All medical record entries made by the Scribe were at my direction and personally dictated by me. I have reviewed the chart and agree that the record accurately reflects my personal performance of the history, physical examination, assessment and plan. I have also personally dictated, reviewed, and agree with the discharge instructions. The patient verbally agreed to the scribe.  Jingli Banner MD?(July 14, 2019 11:33 AM)

## 2019-07-18 ENCOUNTER — Ambulatory Visit

## 2019-07-21 ENCOUNTER — Ambulatory Visit

## 2019-07-21 ENCOUNTER — Ambulatory Visit: Admitting: Internal Medicine

## 2019-07-21 LAB — HX GLYCOHEMOGLOBIN
HX ESTIMATED AVERAGE GLUCOSE: 128 mg/dL
HX HEMOGLOBIN A1C: 6.1 % — ABNORMAL HIGH (ref <=5.6)

## 2019-07-21 NOTE — Telephone Encounter (Signed)
 Phone Note -     Patient    Routine    Call back at [facility] [phone]  Initial call taken by: Lorenda Hatchet,  July 21, 2019 8:45 AM  Actual Caller: Patient  Initial Details of Call:  Fever/No  SOB/No  Headaches/No  New loss of smell/taste/No  Fatigue/No  New cough(not related to chronic condition)/No  New nasal congestion or runny nose (not related to seasonal allergies)/No  Muscle aches/No  Aches and Pains/No  Sore throat/No  Diarrhea/No  Have you been tested for COVID?No  If so, Where (facility) ?  Date?  Covid Result:    Positive / Negative  Has anyone you?ve been in contact with or in your household tested positive for covid ? if YES when?No

## 2019-07-21 NOTE — Telephone Encounter (Signed)
 Phone Note -       Initial call taken by: Carlyn Reichert,  July 21, 2019 3:38 PM  Initial Details of Call:  Clydie Braun from PET CT called ( call back number [phone])  Clydie Braun  states the pt is scheduled for 07/23/19. She needed insurance info for the pt.     Please update the CT order on file to reflect PET CT.         Follow-up #1  Details: I wrote CT result in my note. what do they need?   By: Elliot Cousin MD ~ July 21, 2019 5:38 PM    Details: Called and varified with PET CT and everything is all set and they will be calling the patient sometime this week to set up   By: Leotis Pain [place] ~ July 22, 2019 9:39 AM

## 2019-07-21 NOTE — Progress Notes (Signed)
 Primary Provider:  [name]      History of Present Illness:  Justin Navarro is a 79 year old Male who presents today for: annual  Is there an updated release of information to speak for the patient on file ?   Has previsit planning and a huddle been performed on this patient?    PET scan is booked on [facility]    in two days   Dr Marcheta Grammes ordered it     no cough  hx of COPD    refuses pneumonia vaccines  refuses stool fit cards and colonscopy    Medications Prior to this Visit  SYMBICORT 160-4.5 MCG/ACT INHALATION AEROSOL (BUDESONIDE-FORMOTEROL FUMARATE) Take two whiffs two times daily  rinse out with water after use  LEVOTHYROXINE 112 MCG TABLET (LEVOTHYROXINE SODIUM) TAKE 1 TABLET BY MOUTH EVERY DAY  PRAVASTATIN SODIUM 40 MG ORAL TABLET (PRAVASTATIN SODIUM) Take one po q hs Recheck chol in 2 months  COMPAIR NEBULIZER (NEBULIZERS) Use with albuterol nebs four times daily  IPRATROPIUM-ALBUTEROL 0.5-2.5 (3) MG/3ML INHALATION SOLUTION (IPRATROPIUM-ALBUTEROL) use in neb machine four times daily  COPD   Dx J44.9  CELEBREX 200 MG ORAL CAPSULE (CELECOXIB) Take one po daily DO NOT TAKE IF TAKING INDOCIN  VENTOLIN HFA 108 (90 Base) MCG/ACT INHALATION AEROSOL SOLUTION (ALBUTEROL SULFATE) Take two whiffs q 4 hrs as needed for wheezing; Route: INHALATION  MIRTAZAPINE 15 MG TABLET (MIRTAZAPINE) TAKE 1 TABLET BY MOUTH AT BEDTIME AS NEEDED FOR INSOMNIA  LORAZEPAM 1 MG ORAL TABLET (LORAZEPAM) Take one po two times daily as needed for anxiety No cash fill  CLOTRIMAZOLE-BETAMETHASONE 1-0.05 % EXTERNAL CREAM (CLOTRIMAZOLE-BETAMETHASONE) Apply to rash two times daily  PREDNISONE 10 MG ORAL TABLET (PREDNISONE) 4 tabs daily for 3 days, 3 tabs daily for 3 days, 2 tabs daily for 3 days and 1 tab daily for 3 days; Route: ORAL  PANTOPRAZOLE SODIUM 40 MG ORAL TABLET DELAYED RELEASE (PANTOPRAZOLE SODIUM) Take one by mouth two times daily for swallowing; Route: ORAL  TAMSULOSIN HCL 0.4 MG ORAL CAPSULE (TAMSULOSIN HCL) 1 daily  for urine; Route: ORAL  PREDNISONE 10 MG ORAL TABLET (PREDNISONE) 4 tabs daily x 3 days, then 3 tabs daily x 3 days, then 2 tabs daily x 3 days, then 1 tab daily x 3 days; Route: ORAL  CEFPODOXIME PROXETIL 100 MG ORAL TABLET (CEFPODOXIME PROXETIL) 100mg  by mouth twice daily for 7 days.; Route: ORAL    Current Problems- Reviewed during today's visit  LUNG NODULES (ICD10-R91.8)  COPD: ACUTE EXACERBATION (ICD10-J44.1)  DYSPNEA ON EXERTION (ICD10-R06.09)  ABNORMAL CHEST CT (ZOX09-U04.8)  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)  LUNG MASS, RIGHT LOWER LOBE (RLL) (ICD10-J98.4)  PULMONARY INFILTRATES (ICD10-R91.8)  GERD (GASTROESOPHAGEAL REFLUX DISEASE) (ICD10-K21.9)  URINARY URGENCY (ICD10-R39.15)  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)  HX OF PNEUMONIA (ICD10-Z87.01)  PREDIABETES=A1C OF 6.2 (ICD10-R73.09)  MORBID OBESITY (ICD10-E66.01)  BMI 36-36.9 ADULT (ICD10-Z68.36)  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)  COLONIC POLYPS, HX OF (ICD10-Z86.010)  COPD-MOD (VWU98-J19.9)  ASTHMA, 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)  CARCINOMA, 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)  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)  HYPERLIPIDEMIA (ICD10-E78.5)  HYPOTHYROIDISM (ICD10-E03.9)  TESTOSTERONE 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)  ERECTILE DYSFUNCTION (ICD10-N52.9)  GOUT (ICD10-M10.9)  GERD (JYN82-N56.9)  ANXIETY (ICD10-F41.9)  VITILIGO-HANDS (ICD10-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)  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)    Current Medications- Reviewed during today's visit  SYMBICORT 160-4.5 MCG/ACT INHALATION AEROSOL (BUDESONIDE-FORMOTEROL FUMARATE): Take two whiffs two times daily  rinse out with water after use  LEVOTHYROXINE 112 MCG TABLET: TAKE 1 TABLET BY MOUTH EVERY DAY  PRAVASTATIN SODIUM 40 MG ORAL TABLET: Take one po q hs Recheck chol in 2 months  COMPAIR NEBULIZER (NEBULIZERS): Use with albuterol nebs four times daily  IPRATROPIUM-ALBUTEROL 0.5-2.5 (3) MG/3ML INHALATION SOLUTION: use in neb machine four times daily  COPD   Dx J44.9  CELEBREX 200 MG  ORAL CAPSULE (CELECOXIB): Take one po daily DO NOT TAKE IF TAKING INDOCIN  VENTOLIN HFA 108 (90 Base) MCG/ACT INHALATION AEROSOL SOLUTION (ALBUTEROL SULFATE): Take two whiffs q 4 hrs as needed for wheezing  MIRTAZAPINE 15 MG TABLET: TAKE 1 TABLET BY MOUTH AT BEDTIME AS NEEDED FOR INSOMNIA  LORAZEPAM 1 MG ORAL TABLET: Take one po two times daily as needed for anxiety No cash fill  CLOTRIMAZOLE-BETAMETHASONE 1-0.05 % EXTERNAL CREAM: Apply to rash two times daily  PREDNISONE 10 MG ORAL TABLET: 4 tabs daily for 3 days, 3 tabs daily for 3 days, 2 tabs daily for 3 days and 1 tab daily for 3 days  PANTOPRAZOLE SODIUM 40 MG ORAL TABLET DELAYED RELEASE: Take one by mouth two times daily for swallowing  TAMSULOSIN HCL 0.4 MG ORAL CAPSULE: Take one by mouth two times daily  PREDNISONE 10 MG ORAL TABLET: 4 tabs daily x 3 days, then 3 tabs daily x 3 days, then 2 tabs daily x 3 days, then 1 tab daily x 3 days  CEFPODOXIME PROXETIL 100 MG ORAL TABLET: 100mg  by mouth twice daily for 7 days.      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  appendectomy age 50  s/p T and A age 35 and age 73    Family History  Father:  died age 24, cancer (oral) and cancer of colon, alcoholic  Mother:  died age 33, cancer of breast, alcoholic  Siblings:  one sister COPD, alcoholic    Social History  Marital Status:  divorced  Children: one son age 71  Lives With: girlfriend [name] in winter  Occupation: retired,   former Company secretary --retired age 50--1995     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   Eyes: Denies visual change or blurring, eye pain.   Ears/Nose/Throat: Denies earache, decreased hearing, difficulty swallowing.   Cardiovascular: Denies chest pain or pressure, palpitations, shortness of breath.   Respiratory: Denies dry cough, productive cough, shortness of breath, wheezing.   Gastrointestinal: Denies acid indigestion, nausea, vomiting, diarrhea,  abdominal pain, change in bowel habits, constipation, mucous or blood in stools.   Musculoskeletal: Denies muscle cramps or aches, muscle weakness, morning stiffness, joint pain, joint swelling.   Skin: Denies dry skin, rash, skin ulcers, suspicious lesions.   Psychiatric: Denies anxiety, depression, insomnia.     Vital Signs     Patient: 79 Years Old Male  Height:  70.6 in.  Weight: 260 lbs        BMI:  36.81        36.81 on 07/14/2019  BP:  120/70 right arm, large cuff, seated     128/80 on 07/14/2019   Temp:  97.5  F      Pulse:  71         Pulse Ox: 97 %    Patient is not experiencing pain  Are you having trouble paying for your medications?  No    Medications and Allergies Reviewed    Signed: Vesna Bozic [place]....July 21, 2019 3:42 PM            Physical Exam    General:      well developed, well nourished, in no acute distress.  The patient's current weight is 260 lbs. and BMI is 36.81.    Head:      normocephalic and atraumatic.    Eyes:      PERRL/EOM intact, conjunctiva and sclera clear with out nystagmus.  sees  [facility]   in past had cataract surgery in [place]   eye pressures are ok   Ears:      TM's intact and clear with normal canals with grossly normal hearing.    Nose:      no deformity, discharge, inflammation, or lesions.    Mouth:      post nasal drip.      no dental issues--false teeth  Neck:      no masses, thyromegaly, or abnormal cervical nodes.    Chest Wall:      no deformities or breast masses noted.    Lungs:      wheezes on L and wheezes on R.    Heart:      non-displaced PMI, chest non-tender; regular rate and rhythm, S1, S2 without murmurs, rubs, or gallops  Abdomen:       normal bowel sounds; no hepatosplenomegaly no ventral,umbilical hernias or masses noted.    Rectal:      refused colo  Prostate:      your last Prostate Specific Antigen (PSA) was 0.63 NGM/ML on 07/31/2018.    Msk:      no deformity or scoliosis noted of thoracic or lumbar spine.    Pulses:      pulses normal in all 4 extremities.    Extremities:      no clubbing, cyanosis, edema, or deformity noted with normal full range of motion of all joints.    Neurologic:      no focal deficits, cranial nerves II-XII grossly intact with normal sensation, reflexes, coordination, muscle strength and tone.    Skin:      intact without lesions or rashes.    Cervical Nodes:      no significant adenopathy.    Axillary Nodes:      no significant adenopathy.    Inguinal Nodes:      no significant adenopathy.    Psych:      alert and cooperative; normal mood and affect; normal attention span and concentration.  Assessment and Plan:      ~ASCENDING AORTIC ANEURYSM--4 CM    stable at 4cm  BP normal     ~LUNG NODULES     ~COPD: ACUTE EXACERBATION     ~ABNORMAL CHEST CT       PET scan in 2 days   no biopsy done yet     ~GERD (GASTROESOPHAGEAL REFLUX DISEASE)    Medication(s): PANTOPRAZOLE SODIUM 40 MG ORAL TABLET DELAYED RELEASE: Take one by mouth two times daily for swallowing     stable     ~BPH WITH LOWER URINARY TRACT SYMPTOMS     increasing the tamsulosin to two times daily         No over the counter medications.   Med Compliance and SE's: Pt is compliant with meds with no side effects     Changes to Medication List Documented Today:   TAMSULOSIN HCL 0.4 MG ORAL CAPSULE (TAMSULOSIN HCL) Take one by mouth two times daily; Route: ORAL        Medications:  TAMSULOSIN HCL 0.4 MG ORAL CAPSULE (TAMSULOSIN HCL) Take one by mouth two times daily  #180[Capsule] x 3   Route:ORAL   Entered and Authorized by: [name]   Signed by: [name] on 07/21/2019   Method used: Electronically to      [id number]  [address]  [place], [place]  [address]  Ph: [phone] or [phone]  Fax: [phone]   Note to Pharmacy: Route: ORAL;    RxID: [id number]

## 2019-07-22 ENCOUNTER — Ambulatory Visit: Admitting: Internal Medicine

## 2019-07-22 LAB — HX COMPREHENSIVE METABOLIC PANEL
HX ALBUMIN: 3.8 g/dL (ref 3.2–5.0)
HX ALKALINE PHOSPHATASE: 75 U/L (ref 30.0–117.0)
HX ALT: 38 U/L (ref 6.0–55.0)
HX ANION GAP: 8 (ref 3.0–11.0)
HX AST: 23 U/L (ref 6.0–40.0)
HX BICARBONATE: 26 mmol/L (ref 21.0–32.0)
HX BUN: 17 mg/dL (ref 8.0–23.0)
HX CALCIUM: 9.2 mg/dL (ref 8.5–10.5)
HX CHLORIDE: 105 mmol/L (ref 98.0–110.0)
HX CREATININE: 1.07 mg/dL (ref 0.55–1.3)
HX GLOMERULAR FR AFRICAN AMERICAN: 77
HX GLOMERULAR FR NON AFRICAN AMER: 67
HX GLUCOSE: 127 mg/dL — ABNORMAL HIGH (ref 70.0–110.0)
HX POTASSIUM: 4.7 mmol/L (ref 3.6–5.2)
HX SODIUM: 139 mmol/L (ref 136.0–146.0)
HX TOTAL BILIRUBIN: 0.6 mg/dL (ref 0.2–1.2)
HX TOTAL PROTEIN: 6.9 g/dL (ref 6.0–8.4)

## 2019-07-22 LAB — HX ROUT.URINE WITH MICROSCOPIC
HX URINE BILIRUBIN: NEGATIVE
HX URINE BLOOD: NEGATIVE
HX URINE ESTERASE: NEGATIVE
HX URINE GLUCOSE: NEGATIVE
HX URINE KETONES: NEGATIVE
HX URINE NITRITE: NEGATIVE
HX URINE PH: 6 (ref 5.0–8.0)
HX URINE PROTEIN: NEGATIVE
HX URINE RBC: <1 (ref 0.0–2.0)
HX URINE SPECIFIC GRAVITY: 1.017 (ref 1.003–1.03)
HX URINE SQUAMOUS EPITHELIAL: <1 (ref 0.0–5.0)
HX URINE WBC: <1 (ref 0.0–5.0)
HX UROBILINOGEN, URINE: NEGATIVE

## 2019-07-22 LAB — HX CBC W/DIFF
HX ABSOLUTE BASO COUNT AUTODIFF: 0.02 10*3/uL (ref 0.0–0.22)
HX ABSOLUTE EOS COUNT AUTODIFF: 0.02 10*3/uL (ref 0.0–0.45)
HX ABSOLUTE LYMPHS COUNT AUTODIFF: 1.02 10*3/uL (ref 0.74–5.04)
HX ABSOLUTE MONO COUNT AUTODIFF: 0.38 10*3/uL (ref 0.0–1.34)
HX ABSOLUTE NEUTRO COUNT AUTODIFF: 5.71 10*3/uL (ref 1.48–7.95)
HX BASOPHIL AUTOMATED: 0.3 %
HX EOSINOPHIL AUTOMATED: 0.3 %
HX HEMATOCRIT: 46.1 % (ref 39.0–53.0)
HX HEMOGLOBIN: 14.5 g/dL (ref 13.0–17.5)
HX IG AUTOMATED: 0.6 % (ref 0.0–2.0)
HX LYMPHOCYTE AUTOMATED: 14.2 %
HX MEAN CORP.HEMO.CONC.: 31.5 g/dL (ref 31.0–37.0)
HX MEAN CORPUSCULAR HEMOGLOBIN: 28.4 pg (ref 26.0–34.0)
HX MEAN CORPUSCULAR VOLUME: 90.4 fL (ref 80.0–100.0)
HX MEAN PLATELET VOLUME: 11.4 fL (ref 9.4–12.4)
HX MONOCYTE AUTOMATED: 5.3 %
HX NEUTROPHIL AUTOMATED: 79.3 %
HX NUCLEATED RBC %: 0 % (ref 0.0–0.0)
HX PLATELET COUNT: 237 10*3/uL (ref 150.0–400.0)
HX RED BLOOD COUNT: 5.1 10*6/uL (ref 4.2–5.9)
HX RED CELL DISTRIBUTION WIDTH SD: 46.5 fL (ref 35.0–51.0)
HX WHITE BLOOD COUNT: 7.2 10*3/uL (ref 4.0–11.0)

## 2019-07-22 LAB — HX VITAMIN D,25 HYDROXY: HX VITAMIN D,25 HYDROXY: 23 ng/mL — ABNORMAL LOW (ref 30.0–100.0)

## 2019-07-22 LAB — HX LIPID PANEL FASTING
HX CHOLESTEROL (LIPR): 181 mg/dL (ref <=200)
HX HDL CHOLESTEROL: 68 mg/dL (ref >=40)
HX LDL CHOLESTEROL: 97 mg/dL (ref <=130)
HX TRIGLYCERIDES: 82 mg/dL (ref <=150)

## 2019-07-22 LAB — HX PROSTATE SPECIFIC ANTIGEN SCR: HX PROSTATE SPECIFIC ANTIGEN SCR: 0.55 ng/mL (ref 0.0–4.0)

## 2019-07-22 LAB — HX MICROALBUMIN (RANDOM URINE)
HX MICROALBUMIN (RANDOM URINE): <5 (ref <=30.0)
HX URINE CREATININE (RANDOM): 112 mg/dL (ref 15.0–392.0)

## 2019-07-22 LAB — HX DIRECT BILIRUBIN: HX DIRECT BILIRUBIN: 0.1 mg/dL (ref 0.0–0.3)

## 2019-07-22 LAB — HX TSH WITH REFLEX: HX TSH WITH REFLEX: 0.625 u[IU]/mL (ref 0.358–3.74)

## 2019-07-22 NOTE — Telephone Encounter (Signed)
 Phone Note -       Initial call taken by: [name],  July 22, 2019 12:27 PM  Initial Details of Call:  low D will Rx  sugars pushed up a tiny bit by prednisone but ok  HGBA1C:   07/21/2019 - 6.1  07/31/2018 - 5.7  05/25/2017 - 6.2            Follow-up #1  Details: patient has been given result and agrees to pick up RX   Action: Phone call completed  By: Ardeen Fillers  ~ July 22, 2019 4:14 PM      Medications:  VITAMIN D (ERGOCALCIFEROL) 50000 UNIT ORAL CAPSULE (ERGOCALCIFEROL) 1 by mouth each week for 13 weeks for low Vitamin D  #13[Capsule] x 3   Route:ORAL   Entered and Authorized by: [name]   Signed by: [name] on 07/22/2019   Method used: Electronically to      [id number]  [address]  [place], [place]  [address]  Ph: [phone] or [phone]  Fax: [phone]   Note to Pharmacy: Route: ORAL;    RxID: [id number]        Medications:  Added new medication of VITAMIN D (ERGOCALCIFEROL) 50000 UNIT ORAL CAPSULE (ERGOCALCIFEROL) 1 by mouth each week for 13 weeks for low Vitamin D; Route: ORAL - Signed  Rx of VITAMIN D (ERGOCALCIFEROL) 50000 UNIT ORAL CAPSULE (ERGOCALCIFEROL) 1 by mouth each week for 13 weeks for low Vitamin D; Route: ORAL  #13[Capsule] x 3;  Signed;  Entered by: [name];  Authorized by: [name];  Method used: Electronically to [id number] [address], [place], [place]  [address], Ph: [phone] or [phone], Fax: [phone]; Note to Pharmacy: Route: ORAL;

## 2019-07-23 ENCOUNTER — Ambulatory Visit

## 2019-07-24 ENCOUNTER — Ambulatory Visit

## 2019-07-25 ENCOUNTER — Ambulatory Visit

## 2019-07-25 NOTE — Telephone Encounter (Signed)
 Phone Note -     Patient    Routine    Call back at [facility] [phone]  Initial call taken by: Ileene Patrick ([facility]),  July 25, 2019 9:06 AM  Actual Caller: Patient  Call For: Nurse  Initial Details of Call:  Patient calling to get results of his Pet Scan from 07-23-2019. Please reach out to patient.      Patient requesting call back from: Clinician   In regards to: Patient wants his Pet Scan Results   Best call back #[phone]        Follow-up #1  Details: Please advise and call with the results   By: Leotis Pain [place] ~ July 25, 2019 9:40 AM    Details: called. will discuss more next week   By: Elliot Cousin MD ~ July 25, 2019 7:56 PM

## 2019-07-28 ENCOUNTER — Ambulatory Visit

## 2019-07-28 NOTE — Progress Notes (Signed)
 Primary Provider:  [name]      History of Present Illness:  Justin Navarro is a 79 Year Old Male who is here today for a follow up. Pt is overall doing well. Denies any fever or chills.Pt has hx of smoking, smoked about 2-3PPD for 25 years. He occasionally has wheezing.Complaint with symbicort and rescue inhaler as needed. Recent PET CT reviewed with pt today.     PET CT 07/23/19    IMPRESSION:      Interval resolution of the previously identified nodule in the right      lower lobe pulmonary nodule, which was likely infectious or      inflammatory.    CT 07/01/19   IMPRESSION:       1.  Subpleural right lower lobe spiculated mass measuring 1.3 x 1.1 x      1.3 cm.      2.  2. Peribronchial thickening in the left upper lobe associated with      ill-defined masslike opacity measuring 1.3 x 1.1 x 1.4 cm thick distal      the postinfectious or inflammatory in etiology but given the presence      of a masslike opacity in the right lower lobe, a second focus of      neoplastic change cannot be excluded.       These results were discussed by telephone with Dr. Caffie Damme      covering for Dr. [name] on 07/01/2019 11:03 AM.       Recommend consideration of CT, PET-CT, or tissue sampling in 3 months      according to Fleischner Society guidelines.         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)  HX OF PNEUMONIA (ICD10-Z87.01)  PREDIABETES=A1C OF 6.2 (ICD10-R73.09)  BMI 36-36.9 ADULT (ICD10-Z68.36)  MORBID OBESITY (ICD10-E66.01)  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)  COLONIC POLYPS, HX OF  (ICD10-Z86.010)  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)  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)  ASTHMA, 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)  TESTOSTERONE 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)  ERECTILE DYSFUNCTION (ICD10-N52.9)  GOUT (ICD10-M10.9)  HYPERLIPIDEMIA (ICD10-E78.5)  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)  GERD (GEX52-W41.9)  ANXIETY (ICD10-F41.9)  HYPOTHYROIDISM (ICD10-E03.9)  CARCINOMA, 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)  VITILIGO-HANDS (ICD10-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)    Current Medications- Reviewed during today's visit  SYMBICORT 160-4.5 MCG/ACT INHALATION AEROSOL (BUDESONIDE-FORMOTEROL FUMARATE): Take two whiffs two times daily  rinse out with water after use  LEVOTHYROXINE 112 MCG TABLET: TAKE 1 TABLET BY MOUTH EVERY DAY  PRAVASTATIN SODIUM 40 MG ORAL TABLET: Take one po q hs Recheck chol in 2 months  COMPAIR NEBULIZER (NEBULIZERS): Use with albuterol nebs four times daily  IPRATROPIUM-ALBUTEROL 0.5-2.5 (3) MG/3ML INHALATION SOLUTION: use in neb machine four times daily  COPD   Dx J44.9  CELEBREX 200 MG ORAL CAPSULE (CELECOXIB): Take one po daily DO NOT TAKE IF TAKING INDOCIN  VENTOLIN HFA 108 (90 Base) MCG/ACT INHALATION AEROSOL SOLUTION (ALBUTEROL SULFATE): Take two whiffs q 4 hrs as needed for wheezing  MIRTAZAPINE 15 MG TABLET: TAKE 1 TABLET BY MOUTH AT BEDTIME AS NEEDED FOR INSOMNIA  LORAZEPAM 1 MG ORAL TABLET: Take one by mouth two times daily as needed for anxiety  CLOTRIMAZOLE-BETAMETHASONE 1-0.05 % EXTERNAL CREAM:  Apply to rash two times daily  PREDNISONE 10 MG ORAL TABLET: 4 tabs daily for 3 days, 3 tabs daily for 3 days, 2 tabs daily for 3 days and 1 tab daily for 3 days  PANTOPRAZOLE SODIUM 40 MG ORAL TABLET DELAYED RELEASE: Take one by mouth two times daily for swallowing  TAMSULOSIN HCL 0.4 MG ORAL CAPSULE: Take one by mouth two times daily  PREDNISONE 10 MG ORAL TABLET: 4 tabs daily x 3 days, then 3 tabs daily x 3 days, then 2 tabs daily x 3 days, then 1 tab daily x 3 days  CEFPODOXIME PROXETIL 100 MG ORAL TABLET: 100mg  by mouth twice  daily for 7 days.  VITAMIN D (ERGOCALCIFEROL) 50000 UNIT ORAL CAPSULE (ERGOCALCIFEROL): 1 by mouth each week for 13 weeks for low Vitamin D  SPIRIVA RESPIMAT 1.25 MCG/ACT INHALATION AEROSOL SOLUTION (TIOTROPIUM BROMIDE MONOHYDRATE): 2 puffs once daily  PREDNISONE 10 MG ORAL TABLET: 4 tabs daily x 3 days, then 3 tabs daily x 3 days, then 2 tabs daily x 3 days, then 1 tab daily x 3 days  DOXYCYCLINE HYCLATE 100 MG ORAL TABLET: Take one capsule by mouth twice daily for 7 days, for infection.    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  appendectomy age 34  s/p T and A age 32 and age 65    Family History  Father:  died age 36, cancer (oral) and cancer of colon, alcoholic  Mother:  died age 92, cancer of breast, alcoholic  Siblings:  one sister COPD, alcoholic    Social History  Marital Status:  divorced  Children: one son age 80  Lives With: girlfriend Justin Navarro in winter  Occupation: retired,   former Company secretary --retired age 79--1995     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   Eyes: Denies visual change or blurring, eye pain.   Ears/Nose/Throat: Denies  earache, decreased hearing, difficulty swallowing.   Cardiovascular: Denies chest pain or pressure, palpitations, shortness of breath.   Respiratory: Complains of wheezing. Denies dry cough, productive cough, shortness of breath.   Gastrointestinal: Denies acid indigestion, nausea, vomiting, diarrhea, abdominal pain, change in bowel habits, constipation, mucous or blood in stools.   Musculoskeletal: Denies muscle cramps or aches, muscle weakness, morning stiffness, joint pain, joint swelling.   Skin: Denies dry skin, rash, skin ulcers, suspicious lesions.   Psychiatric: Denies anxiety, depression, insomnia.     All other pertinent systems were reviewed and are  negative   Vital Signs     Patient: 79 Years Old Male  Height:  70.6 in.  Weight: 234 lbs      Wt Chg: -26 since 07/21/2019  BMI:  33.13        36.81 on 07/21/2019  BP:  122/64 right arm, normal cuff, seated     120/70 on 07/21/2019   Temp:  97.11  F      Pulse:  86         Pulse Ox: 96 %    Patient is not experiencing pain  Are you having trouble paying for your medications?  No    Medications and Allergies Reviewed    Signed: Leotis Pain ....July 28, 2019 1:22 PM          Physical Exam    General:      well developed, well nourished, in no acute distress.    Head:      normocephalic and atraumatic.    Neck:      no masses, thyromegaly, or abnormal cervical nodes.    Lungs:      clear bilaterally to auscultation.    Heart:      regular rhythm.    Abdomen:       normal bowel sounds; no hepatosplenomegaly no ventral,umbilical hernias or masses noted.    Msk:      no deformity or scoliosis noted of thoracic or lumbar spine.    Extremities:      no clubbing, cyanosis, edema, or deformity noted with normal full range of motion of all joints.    Neurologic:      no focal deficits, cranial nerves II-XII grossly intact with normal sensation, reflexes, coordination, muscle strength and tone.    Psych:      alert and cooperative; normal mood and affect; normal  attention span and concentration.           Assessment and Plan:      ~LUNG MASS, RIGHT LOWER LOBE (RLL) (J98.4)    ~COPD  ~DYSPNEA ON EXERTION (R06.09)    ~ABNORMAL CHEST CT (R91.8)   ~LUNG NODULE (R91.8)      No s/s of exacerbation or active infection. Denies any fever or chills. Pt has hx of smoking, smoked about 2-3PPD for 25 years. Complaint with symbicort and rescue inhaler as needed.  He notes he uses his rescue inhaler often when on exertion. Discussed adding spiriva. Recent PET CT reviewed with pt today.     repeat CT w/o contrast in 6-8 weeks  will add spiriva   continue symbicort, rescue inhaler as needed  will prescribe a course of abx and prednisone for emergency use   f/u after CT    PET CT 07/23/19    IMPRESSION:            Interval resolution of the previously identified nodule in the right      lower lobe pulmonary nodule, which was likely infectious or      inflammatory        Abnormal FDG accumulation associated with the bandlike and nodular      opacities in the left upper lobe. These opacities do appear smaller on      the accompanying attenuation correction CT scan; however, evaluation      is limited due to motion artifact. This does not appear particularly      masslike and this could be infectious or inflammatory. A short-term      follow-up chest CT  scan in 6-8 weeks is recommended to assess for      resolution.            No abnormal FDG accumulation elsewhere to suggest metastatic disease      or a primary malignancy.    Patient/Caregiver understand instructions and plan.    Changes to Medication List Documented Today:   SPIRIVA RESPIMAT 1.25 MCG/ACT INHALATION AEROSOL SOLUTION (TIOTROPIUM BROMIDE MONOHYDRATE) 2 puffs once daily; Route: INHALATION  PREDNISONE 10 MG ORAL TABLET (PREDNISONE) 4 tabs daily x 3 days, then 3 tabs daily x 3 days, then 2 tabs daily x 3 days, then 1 tab daily x 3 days; Route: ORAL  DOXYCYCLINE HYCLATE 100 MG ORAL TABLET (DOXYCYCLINE HYCLATE) Take one capsule by  mouth twice daily for 7 days, for infection.; Route: ORAL  Orders:   Added new Service order of Patient Encounter ([id number]) - Signed  Added new Service order of Est. Pt 15 ([id number]) ([id number]) - Signed  Added new Test order of CT - CHEST/THORAX WITHOUT CONTRAST (CTCHWO     ) - Signed        Medications:  DOXYCYCLINE HYCLATE 100 MG ORAL TABLET (DOXYCYCLINE HYCLATE) Take one capsule by mouth twice daily for 7 days, for infection.  #14[Tablet] x 0   Route:ORAL   Entered and Authorized by: Elliot Cousin MD   Signed by: Elliot Cousin MD on 07/28/2019   Method used: Electronically to      [id number]  [address]  [place], [place]  [address]  Ph: [phone] or [phone]  Fax: [phone]   Note to Pharmacy: Route: ORAL;    RxID: [id number]  PREDNISONE 10 MG ORAL TABLET (PREDNISONE) 4 tabs daily x 3 days, then 3 tabs daily x 3 days, then 2 tabs daily x 3 days, then 1 tab daily x 3 days  #30[Tablet] x 0   Route:ORAL   Entered and Authorized by: Elliot Cousin MD   Signed by: Elliot Cousin MD on 07/28/2019   Method used: Electronically to      [id number]  [address]  [place], [place]  [address]  Ph: [phone] or [phone]  Fax: [phone]   Note to Pharmacy: Route: ORAL;    RxID: [id number]  SPIRIVA RESPIMAT 1.25 MCG/ACT INHALATION AEROSOL SOLUTION (TIOTROPIUM BROMIDE MONOHYDRATE) 2 puffs once daily  #1[Inhalation] x 4   Route:INHALATION   Entered and Authorized by: Elliot Cousin MD   Signed by: Elliot Cousin MD on 07/28/2019   Method used: Electronically to      [id number]  [address]  [place], [place]  [address]  Ph: [phone] or [phone]  Fax: [phone]   Note to Pharmacy: Route: INHALATION;    RxID: [id number]        By signing my name below, I, Angelica K. Sou , attest that this documentation has been prepared under the direction and in the presence of Elliot Cousin MD.   Electronically Signed: Angelica K. Sou   July 28, 2019 1:08 PM    All medical record entries made by the Scribe were at my direction  and personally dictated by me. I have reviewed the chart and agree that the record accurately reflects my personal performance of the history, physical examination, assessment and plan. I have also personally dictated, reviewed, and agree with the discharge instructions. The patient verbally agreed to the scribe.  Jingli Mount Calm MD(July 28, 2019 1:37 PM)

## 2019-07-29 ENCOUNTER — Ambulatory Visit

## 2019-07-29 NOTE — Telephone Encounter (Signed)
 Phone Note -     Patient    Routine    Initial call taken by: Zachery Conch Ruisseaux (Patient Access Center),  July 29, 2019 12:46 PM  Initial Details of Call:  Patient called in stating that his pharmacy informed him that his new prescription SPIRIVA RESPIMAT 1.25 MCG is not covered by insurance. Patient woul dlike a different prescription. Please follow up     Call back#[phone]        Medications:  INCRUSE ELLIPTA 62.5 MCG/INH INHALATION AEROSOL POWDER BREAT (UMECLIDINIUM BROMIDE) 1 puff daily: Rinse mouth thoroughly with water after use.  #1[Inhaler] x 3   Route:INHALATION   Entered and Authorized by: Elliot Cousin MD   Signed by: Elliot Cousin MD on 07/29/2019   Method used: Electronically to      [id number]  [address]  [place], [place]  [address]  Ph: [phone] or [phone]  Fax: [phone]   Note to Pharmacy: Route: INHALATION;    RxID: [id number]        Medications:  Added new medication of INCRUSE ELLIPTA 62.5 MCG/INH INHALATION AEROSOL POWDER BREAT (UMECLIDINIUM BROMIDE) 1 puff daily: Rinse mouth thoroughly with water after use.; Route: INHALATION - Signed  Rx of INCRUSE ELLIPTA 62.5 MCG/INH INHALATION AEROSOL POWDER BREAT (UMECLIDINIUM BROMIDE) 1 puff daily: Rinse mouth thoroughly with water after use.; Route: INHALATION  #1[Inhaler] x 3;  Signed;  Entered by: Elliot Cousin MD;  Authorized by: Elliot Cousin MD;  Method used: [phone] [address], [place], [place]  [address], Ph: [phone] or [phone], Fax: [phone]; Note to Pharmacy: Route: INHALATION;

## 2019-07-29 NOTE — Telephone Encounter (Signed)
 Phone Note -     Patient    Call back at [facility] [phone]  Initial call taken by: Allena Earing. Chilton Greathouse (Patient Access Center),  July 29, 2019 9:12 AM  Actual Caller: Patient  Call For: Nurse  Initial Details of Call:  TTC  Pt is calling in stating that pt needs a CT please  Please reach out to pt to schedule that CT  Pt has an appt Aug 18 at 9.30am      Follow-up #1  Details: Called and spoke to patient to let him know that he is scheduled for his CT w/o contrast on Aug 11 @ 11 here at [facility] and his follow up with Dr.Millville is on Aug 18  By: Leotis Pain [place]on ~ July 29, 2019 10:14 AM

## 2019-08-07 ENCOUNTER — Ambulatory Visit

## 2019-08-07 NOTE — Telephone Encounter (Signed)
 Phone Note -     Patient    Routine    Call back at [facility] [phone]  Initial call taken by: Pat Kocher (Patient Access Center),  August 07, 2019 1:22 PM  Actual Caller: Patient  Initial Details of Call:  Patient called said that he is in [place] he spends there 6 months out of the year and he would like to get Pulminary Doctor out there he said that he found one but needs referral    Dr. Radene Knee  fax# [phone]    Patient can be reached at# [phone]      Follow-up #1  Details: pt will need to contact PCP for referral. LM for pt  By: Link Snuffer ~ August 08, 2019 9:27 AM

## 2019-08-08 ENCOUNTER — Ambulatory Visit

## 2019-08-08 NOTE — Telephone Encounter (Signed)
Phone Note -     Patient    Routine    Call back at [facility] [phone]  Initial call taken by: Link Snuffer,  August 08, 2019 1:47 PM  Initial Details of Call:  rescheduled 8.18.21 appt from 9:30 to 11:15

## 2019-08-08 NOTE — Telephone Encounter (Signed)
 Phone Note -     Patient    **Informational purposes only**    Call back at [facility] [phone]  Initial call taken by: Lucretia Kern (Patient Access Center),  August 08, 2019 9:46 AM  Initial Details of Call:  Patient wanted to let Dr. Marcheta Grammes know he will be trying to find another pulmonologist while he is in [place] 6 months of the year but also wants to keep Dr. Marcheta Grammes for when he is in town.    Call Back [phone]      Follow-up #1  Details: noted  By: Link Snuffer ~ August 08, 2019 10:04 AM

## 2019-08-08 NOTE — Telephone Encounter (Signed)
 Phone Note -     Patient    Routine    Call back at [facility] [phone]  Initial call taken by: Lucretia Kern (Patient Access Center),  August 08, 2019 9:44 AM  Initial Details of Call:  Patient called said that he is in [place] he spends there 6 months out of the year and he would like to geta pulmonologist out there he said that he found one but needs referral  Dr. Radene Knee  fax# [phone]    Patient can be reached at# [phone]      Follow-up #1  Details: on medicare no referral required   Action: Left Message for Patient  By: Lind Covert ~ August 08, 2019 9:48 AM

## 2019-09-05 ENCOUNTER — Ambulatory Visit

## 2019-09-05 NOTE — Telephone Encounter (Signed)
 Phone Note -       Initial call taken by: Adelina Mings,  September 05, 2019 4:08 PM  Initial Details of Call:  SWP - confirmed ov - informed pt to wear their mask

## 2019-09-10 ENCOUNTER — Ambulatory Visit

## 2019-09-10 ENCOUNTER — Ambulatory Visit: Admitting: Pulmonary Disease

## 2019-09-11 ENCOUNTER — Ambulatory Visit

## 2019-09-11 NOTE — Telephone Encounter (Signed)
 Phone Note -       Initial call taken by: Yetta Glassman PA,  September 11, 2019 11:34 AM  Initial Details of Call:  Spoke to patient and discusse CT Chest results concerning for possible neoplastic process. He denies fever, chills, cough and other new respiratory complaints. Recommend repeat PET/CT. Dr. Peggyann Juba has also been contacted for further review of imaging and recommendations.       Follow-up #1  Details: Faxed order and noted to [facility] PET and comfirmation recieved   By: Leotis Pain [place] ~ September 15, 2019 9:06 AM          Orders:  Added new Test order of PET-CT Lung Diagnosis (PTLUNGDX) - Signed  Added new Test order of PET-CT Lung Diagnosis (PTLUNGDX) - Signed

## 2019-09-15 ENCOUNTER — Ambulatory Visit

## 2019-09-15 NOTE — Telephone Encounter (Signed)
 Phone Note -     Patient    Routine    Initial call taken by: Pryor Curia (Patient Access Center),  September 15, 2019 9:56 AM  Initial Details of Call:  Daughter in law Revonda Standard called in to talk to Arlys John in regards to next step. Patient recently had a PET-SCAN. Revonda Standard can be reached at [phone].      Follow-up #1  Details: discussed  By: Elliot Cousin MD ~ September 16, 2019 8:11 AM

## 2019-09-16 ENCOUNTER — Ambulatory Visit

## 2019-09-16 NOTE — Telephone Encounter (Signed)
 Phone Note -     Patient    **Informational purposes only**    Call back at [phone]  Initial call taken by: Jasper Riling (Patient Access Center),  September 16, 2019 11:29 AM  Initial Details of Call:  Patient changed his address to [address] [place], [place] [address].    Call back # [phone]

## 2019-09-16 NOTE — Telephone Encounter (Signed)
 Phone Note -     Patient    Routine    Call back at [phone]  Initial call taken by: Jasper Riling (Patient Access Center),  September 16, 2019 11:38 AM  Initial Details of Call:  Patient called to requests medication for his nebulizer. Patient states he doesn't know what he needs.    Call back # [phone]      Follow-up #1  Details: [facility], [facility], [facility] should bill throught Medicare part B (I put a note on script for future scripts to bill through Medicare Part B).      Scriipt went through part B and [facility]  told pt.     (pt. states he is not having any breathing problems, he is at his baseline for breathing).    By: Mellody Life DiFraia RN ~ September 16, 2019 4:04 PM          Medications:  Changed medication from IPRATROPIUM-ALBUTEROL 0.5-2.5 (3) MG/3ML INHALATION SOLUTION (IPRATROPIUM-ALBUTEROL) use in neb machine four times daily  COPD   Dx J44.9 to IPRATROPIUM-ALBUTEROL 0.5-2.5 (3) MG/3ML INHALATION SOLUTION (IPRATROPIUM-ALBUTEROL) use in neb machine four times daily  COPD   Dx J44.9

## 2019-09-16 NOTE — Telephone Encounter (Signed)
 Phone Note -     Patient    Routine    Call back at Ph1 [phone]  Initial call taken by: Link Snuffer,  September 16, 2019 2:32 PM  Initial Details of Call:  r/s 9.18.21 appt to 9.9.21

## 2019-09-17 ENCOUNTER — Ambulatory Visit

## 2019-09-17 NOTE — Progress Notes (Signed)
 [facility]   [address]  [place], [place] [address]  Phone: [phone]  Fax: [phone]       September 17, 2019      [facility]  [address]  [place], [place] [address]    Dear Mr. Kazmi:      We are writing to let you know that you missed your scheduled appointment with our office on 09/17/2019 at 2:30 PM.  We sincerely hope that you are well and your health is our primary concern.  Our goal at [facility] is to provide you with high-quality and efficient care.      We understand that there are occasions when a patient must miss an appointment due to unforeseen circumstances or a scheduling conflict beyond his or her control. In this event, we ask that you call our office and cancel your appointment at least 24 hours prior to the scheduled visit. This courtesy allows the office staff to schedule another patient who is also in need of medical care.    Please call the office and we would be happy to provide support with rescheduling the appointment for a date and time that will work for you.     Thank you for choosing [facility].  We hope to speak with you soon.    Sincerely,    Josiah Lobo, M.D.

## 2019-10-07 ENCOUNTER — Ambulatory Visit

## 2019-10-08 ENCOUNTER — Ambulatory Visit

## 2019-10-08 NOTE — Telephone Encounter (Signed)
 Phone Note -     Patient    Routine    Call back at [phone]  Initial call taken by: Link Snuffer,  October 08, 2019 2:03 PM  Initial Details of Call:  Called pt to confirm appt. Pt confirmed.

## 2019-10-09 ENCOUNTER — Ambulatory Visit

## 2019-10-09 NOTE — Progress Notes (Signed)
 Primary Provider:  [name]      History of Present Illness:  Justin Navarro is a 79 Year Old Male who is here today for a follow up. Reports breathing issues began in June of 2021. Pt is overall doing better after taking prednisone. Complains of porductive cough with light yellow phlegm. Denies any fever or chills. Pt has hx of smoking, smoked about 2-3PPD for 25 years. He occasionally has wheezing. Complaint with symbicort and rescue inhaler (2-3x daily). Complaint with prednisone taper and incruse. Previous two PET CTs reviewed with pt today.       PET CT 07/23/19    IMPRESSION:      Interval resolution of the previously identified nodule in the right      lower lobe pulmonary nodule, which was likely infectious or      inflammatory.    CT 07/01/19   IMPRESSION:       1.  Subpleural right lower lobe spiculated mass measuring 1.3 x 1.1 x      1.3 cm.      2.  2. Peribronchial thickening in the left upper lobe associated with      ill-defined masslike opacity measuring 1.3 x 1.1 x 1.4 cm thick distal      the postinfectious or inflammatory in etiology but given the presence      of a masslike opacity in the right lower lobe, a second focus of      neoplastic change cannot be excluded.       These results were discussed by telephone with Dr. Caffie Damme      covering for Dr. [name] on 07/01/2019 11:03 AM.       Recommend consideration of CT, PET-CT, or tissue sampling in 3 months      according to Fleischner Society guidelines.         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)  HX OF  PNEUMONIA (ICD10-Z87.01)  PREDIABETES=A1C OF 6.2 (ICD10-R73.09)  BMI 36-36.9 ADULT (ICD10-Z68.36)  MORBID OBESITY (ICD10-E66.01)  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)  COLONIC POLYPS, HX OF (ICD10-Z86.010)  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)  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)  ASTHMA, 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)  TESTOSTERONE 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)  ERECTILE DYSFUNCTION (ICD10-N52.9)  GOUT (ICD10-M10.9)  HYPERLIPIDEMIA (ICD10-E78.5)  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)  GERD (KGM01-U27.9)  ANXIETY (ICD10-F41.9)  HYPOTHYROIDISM (ICD10-E03.9)  CARCINOMA, 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)  VITILIGO-HANDS (ICD10-L80)    Current Medications- Reviewed during today's visit  SYMBICORT 160-4.5 MCG/ACT INHALATION AEROSOL (BUDESONIDE-FORMOTEROL FUMARATE): Take two whiffs two times daily  rinse out with water after use  LEVOTHYROXINE 112 MCG TABLET: TAKE 1 TABLET BY MOUTH EVERY DAY  PRAVASTATIN SODIUM 40 MG ORAL TABLET: Take one po q hs Recheck chol in 2 months  COMPAIR NEBULIZER (NEBULIZERS): Use with albuterol nebs four times daily  IPRATROPIUM-ALBUTEROL 0.5-2.5 (3) MG/3ML INHALATION SOLUTION: use in neb machine four times daily  COPD   Dx J44.9  CELEBREX 200 MG ORAL CAPSULE (CELECOXIB): Take one po daily DO NOT TAKE IF TAKING INDOCIN  VENTOLIN HFA 108 (90 Base) MCG/ACT INHALATION AEROSOL SOLUTION (ALBUTEROL SULFATE): Take two whiffs q 4 hrs as needed for wheezing  MIRTAZAPINE 15 MG TABLET: TAKE 1 TABLET BY  MOUTH AT BEDTIME AS NEEDED FOR INSOMNIA  LORAZEPAM 1 MG ORAL TABLET: Take one by mouth two times daily as needed for anxiety  CLOTRIMAZOLE-BETAMETHASONE 1-0.05 % EXTERNAL CREAM: Apply to rash two times daily  PREDNISONE 10 MG ORAL TABLET: 4 tabs daily for 3 days, 3 tabs daily for 3 days, 2 tabs daily for 3 days and 1 tab daily for 3 days  PANTOPRAZOLE SODIUM 40 MG ORAL TABLET DELAYED RELEASE: Take one by mouth two times daily for swallowing  TAMSULOSIN HCL 0.4 MG ORAL CAPSULE: Take one by mouth two times  daily  PREDNISONE 10 MG ORAL TABLET: 4 tabs daily x 3 days, then 3 tabs daily x 3 days, then 2 tabs daily x 3 days, then 1 tab daily x 3 days  CEFPODOXIME PROXETIL 100 MG ORAL TABLET: 100mg  by mouth twice daily for 7 days.  VITAMIN D (ERGOCALCIFEROL) 50000 UNIT ORAL CAPSULE (ERGOCALCIFEROL): 1 by mouth each week for 13 weeks for low Vitamin D  SPIRIVA RESPIMAT 1.25 MCG/ACT INHALATION AEROSOL SOLUTION (TIOTROPIUM BROMIDE MONOHYDRATE): 2 puffs once daily  PREDNISONE 10 MG ORAL TABLET: 4 tabs daily x 3 days, then 3 tabs daily x 3 days, then 2 tabs daily x 3 days, then 1 tab daily x 3 days  DOXYCYCLINE HYCLATE 100 MG ORAL TABLET: Take one capsule by mouth twice daily for 7 days, for infection.  INCRUSE ELLIPTA 62.5 MCG/INH INHALATION AEROSOL POWDER BREAT (UMECLIDINIUM BROMIDE): 1 puff daily: Rinse mouth thoroughly with water after use.  CEFPODOXIME PROXETIL 100 MG ORAL TABLET: 100mg  by mouth twice daily for 7 days.  BUDESONIDE 0.5 MG/2ML INHALATION SUSPENSION: Use twice daily.  Rinse mouth with water after each use.    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  appendectomy age 61  s/p T and A age 11 and age 89    Family History  Father:  died age 53, cancer (oral) and cancer of colon, alcoholic  Mother:  died age 96, cancer of breast, alcoholic  Siblings:  one sister COPD, alcoholic    Social History  Marital Status:  divorced  Children: one son age 39  Lives With: girlfriend [name] in winter  Occupation: retired,   former Company secretary --retired age 16--1995     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   Eyes: Denies visual change or blurring, eye pain.   Ears/Nose/Throat: Denies earache, decreased hearing, difficulty swallowing.   Cardiovascular: Denies chest pain or pressure, palpitations, shortness  of breath.   Respiratory: Denies dry cough.   Gastrointestinal: Denies acid indigestion, nausea, vomiting, diarrhea, abdominal pain, change in bowel habits, constipation, mucous or blood in stools.   Musculoskeletal: Denies muscle cramps or aches, muscle weakness, morning stiffness, joint pain, joint swelling.   Skin: Denies dry skin, rash, skin ulcers, suspicious lesions.   Psychiatric: Denies anxiety, depression, insomnia.     Vital Signs     Patient: 79 Years Old Male  Height:  70.6 in.  Weight: 255 lbs      Wt Chg: 21 since 07/28/2019  BMI:  36.10        33.13 on 07/28/2019  BP:  137/72 right arm, large cuff, seated     122/64 on 07/28/2019   Temp:  97.11  F      Pulse:  95         Pulse Ox: 96 %    Patient is not experiencing pain  Are you having trouble paying for your medications?  No    Medications and Allergies Reviewed    Signed: Leotis Pain [place].Marland KitchenMarland KitchenMarland KitchenSeptember  9, 2021 10:53 AM          Physical Exam    General:      well developed, well nourished, in no acute distress.    Neck:      no masses, thyromegaly, or abnormal cervical nodes.    Lungs:      wheezes on L and wheezes on R.  wheezes on L and wheezes on R.    Heart:      regular rhythm.  regular rhythm.    Abdomen:       normal bowel sounds; no hepatosplenomegaly no ventral,umbilical hernias or masses  noted.    Msk:      no deformity or scoliosis noted of thoracic or lumbar spine.    Extremities:      no clubbing, cyanosis, edema, or deformity noted with normal full range of motion of all joints.    Neurologic:      no focal deficits, cranial nerves II-XII grossly intact with normal sensation, reflexes, coordination, muscle strength and tone.    General:      well developed, well nourished, in no acute distress.    Chest Wall:      no deformities or breast masses noted.    Lungs:      clear bilaterally to auscultation.    Abdomen:       normal bowel sounds; no hepatosplenomegaly no ventral,umbilical hernias or masses noted.    Msk:      no deformity or scoliosis noted of thoracic or lumbar spine.    Extremities:      no clubbing, cyanosis, edema, or deformity noted with normal full range of motion of all joints.    Psych:      alert and cooperative; normal mood and affect; normal attention span and concentration.        Assessment and Plan:      ~Llung nodules R 91.8      ~COPD  ~DYSPNEA ON EXERTION (R06.09)    ~ABNORMAL CHEST CT (R91.8)   ~LUNG NODULE (R91.8)      Denies any fever or chills. Pt has hx of smoking, smoked about 2-3PPD for 25 years. Complains of porductive cough with light yellow phlegm. Complaint with symbicort and rescue inhaler (2-3x daily). Complaint with prednisone taper and incruse. He notes he uses his rescue inhaler often when on exertion. Previous two PET CTs (6/23 &  9/7) reviewed with pt today. Discussed sputum culture and additional CT scan. pt started prednisone taper and doxycycline, slight improvement of his wheezing,     repeat CT w/o contrast in November. to r/o lung nodules.   pt told to go to ER if condition worsens.  will order sputum culture.  Continue prednisone taper.  will finish a course of doxycyline.   continue symbicort and incruse.   w  continue symbicort, rescue inhaler as needed  f/u after CT  weeks is recommended to assess for resolution.          PETMIDSKUL  (10/07/19)  IMPRESSION:             The RIGHT lower lobe pulmonary nodule continues to be obscured by      respiratory motion artifact on PET/CT. No elevated FDG uptake is      referable to this area. The nodule is not clearly visualized and      should be followed by CT scan.            Decreased metabolic activity within the 2 LEFT lung pulmonary nodules      although the size has slightly increased. No evidence of FDG avid      mediastinal adenopathy. These findings may be infectious or      inflammatory in etiology rather than neoplastic. This should be      followed on subsequent chest CT.            No evidence of abnormal FDG uptake below the diaphragm.            RECOMMENDATION: 2 month follow-up noncontrast chest CTPETMIDSKUL (10/07/19)  IMPRESSION:             The RIGHT lower lobe pulmonary nodule continues to be obscured by      respiratory motion artifact on PET/CT. No elevated FDG uptake is      referable to this area. The nodule is not clearly visualized and      should be followed by CT scan.            Decreased metabolic activity within the 2 LEFT lung pulmonary nodules      although the size has slightly increased. No evidence of FDG avid      mediastinal adenopathy. These findings may be infectious or      inflammatory in etiology rather than neoplastic. This should be      followed on subsequent chest CT.            No evidence of abnormal FDG uptake below the diaphragm.            RECOMMENDATION: 2 month follow-up noncontrast chest CT    PET CT 07/23/19    IMPRESSION:            Interval resolution of the previously identified nodule in the right      lower lobe pulmonary nodule, which was likely infectious or      inflammatory        Abnormal FDG accumulation associated with the bandlike and nodular      opacities in the left upper lobe. These opacities do appear smaller on      the accompanying attenuation correction CT scan; however, evaluation      is limited due to motion artifact. This does not  appear particularly      masslike and this could be infectious or inflammatory. A short-term      follow-up chest CT  scan in 6-8    Patient/Caregiver understand instructions and plan.    Changes to Medication List Documented Today:   CEFPODOXIME PROXETIL 100 MG ORAL TABLET (CEFPODOXIME PROXETIL) 100mg  by mouth twice daily for 7 days.; Route: ORAL  BUDESONIDE 0.5 MG/2ML INHALATION SUSPENSION (BUDESONIDE) Use twice daily.  Rinse mouth with water after each use.; Route: INHALATION  Orders:   Added new Service order of Patient Encounter ([id number]) - Signed  Added new Service order of Est. Pt. 25 ([id number]) ([id number]) - Signed  Added new Test order of CT - CHEST/THORAX WITHOUT CONTRAST (CTCHWO     ) - Signed  Added new Test order of SPC -Sputum Culture & Gram Stain (SPC) - Signed        Medications:  BUDESONIDE 0.5 MG/2ML INHALATION SUSPENSION (BUDESONIDE) Use twice daily.  Rinse mouth with water after each use.  #180[Milliliter] x 3   Route:INHALATION   Entered and Authorized by: Elliot Cousin MD   Signed by: Elliot Cousin MD on 10/09/2019   Method used: Electronically to      [id number]  [address]  [place], [place]  [address]  Ph: [phone] or [phone]  Fax: [phone]   Note to Pharmacy: Route: INHALATION;    RxID: [id number]  CEFPODOXIME PROXETIL 100 MG ORAL TABLET (CEFPODOXIME PROXETIL) 100mg  by mouth twice daily for 7 days.  #14[Tablet] x 0   Route:ORAL   Entered and Authorized by: Elliot Cousin MD   Signed by: Elliot Cousin MD on 10/09/2019   Method used: Electronically to      [id number]  [address]  [place], [place]  [address]  Ph: [phone] or [phone]  Fax: [phone]   Note to Pharmacy: Route: ORAL;    RxID: [id number]        By signing my name below, I, Melvyn Novas, attest that this documentation has been prepared under the direction and in the presence of Elliot Cousin MD.   Electronically Signed: Melvyn Novas  October 09, 2019 10:10 AM    All medical record entries made by the Scribe  were at my direction and personally dictated by me. I have reviewed the chart and agree that the record accurately reflects my personal performance of the history, physical examination, assessment and plan. I have also personally dictated, reviewed, and agree with the discharge instructions. The patient verbally agreed to the scribe.  Jingli Clontarf MD(October 09, 2019 11:20 AM)

## 2019-10-10 ENCOUNTER — Ambulatory Visit

## 2019-10-10 ENCOUNTER — Ambulatory Visit: Admitting: Pulmonary Disease

## 2019-10-10 NOTE — Telephone Encounter (Signed)
 Phone Note -     Patient    Routine    Call back at [facility] [phone]  Initial call taken by: Ileene Patrick (Patient Access Center),  October 10, 2019 11:18 AM  Actual Caller: Patient  Call For: Nurse  Initial Details of Call:  Patient of Dr. Marcheta Grammes calling asking about medication that was dissussed during 10-09-2019. Patient stated that Dr. Marcheta Grammes was going to prescribe a medication to add to his nebulizer.  Patient stated that he thought that medication for nebulizer was supposed to have prednisone mixed with it. Patient stated that he does not have enough prednisone left to do a taper. Patient stated that he only has three tablets of prednisone left  Please assist.     Best call back # [phone]        Medications:  PREDNISONE 10 MG ORAL TABLET (PREDNISONE) 4 tabs daily x 3 days, then 3 tabs daily x 3 days, then 2 tabs daily x 3 days, then 1 tab daily x 3 days  #30[Tablet] x 0   Route:ORAL   Entered and Authorized by: Elliot Cousin MD   Signed by: Elliot Cousin MD on 10/10/2019   Method used: Electronically to      [id number]  [address]  [place], [place]  [address]  Ph: [phone] or [phone]  Fax: [phone]   Note to Pharmacy: Route: ORAL;    RxID: [id number]  BUDESONIDE 0.5 MG/2ML INHALATION SUSPENSION (BUDESONIDE) Use twice daily.  Rinse mouth with water after each use.  #180[Milliliter] x 3   Route:INHALATION   Entered and Authorized by: Elliot Cousin MD   Signed by: Elliot Cousin MD on 10/10/2019   Method used: Electronically to      [id number]  [address]  [place], [place]  [address]  Ph: [phone] or [phone]  Fax: [phone]   Note to Pharmacy: Route: INHALATION;    RxID: [id number]        Medications:  Added new medication of BUDESONIDE 0.5 MG/2ML INHALATION SUSPENSION (BUDESONIDE) Use twice daily.  Rinse mouth with water after each use.; Route: INHALATION - Signed  Added new medication of PREDNISONE 10 MG ORAL TABLET (PREDNISONE) 4 tabs daily x 3 days, then 3 tabs daily x 3 days, then 2 tabs  daily x 3 days, then 1 tab daily x 3 days; Route: ORAL - Signed  Rx of BUDESONIDE 0.5 MG/2ML INHALATION SUSPENSION (BUDESONIDE) Use twice daily.  Rinse mouth with water after each use.; Route: INHALATION  #180[Milliliter] x 3;  Signed;  Entered by: Elliot Cousin MD;  Authorized by: Elliot Cousin MD;  Method used: [id number] [address] [place], [place]  [address], Ph: [phone] or [phone], Fax: [phone]; Note to Pharmacy: Route: INHALATION;  Rx of PREDNISONE 10 MG ORAL TABLET (PREDNISONE) 4 tabs daily x 3 days, then 3 tabs daily x 3 days, then 2 tabs daily x 3 days, then 1 tab daily x 3 days; Route: ORAL  #30[Tablet] x 0;  Signed;  Entered by: Elliot Cousin MD;  Authorized by: Elliot Cousin MD;  Method used: [id number]*, [address], [place], [place]  [address], Ph: [phone] or [phone], Fax: [phone]; Note to Pharmacy: Route: ORAL;

## 2019-10-10 NOTE — Telephone Encounter (Signed)
 Phone Note -     Patient    Routine    Call back at [facility] [phone]  Initial call taken by: Ileene Patrick (Patient Access Center),  October 10, 2019 3:05 PM  Actual Caller: Patient  Call For: Nurse  Initial Details of Call:  Patient of Dr. Marcheta Grammes calling to state that prescription for BUDESONIDE 0.5 MG/2ML INHALATION SUSPENSION was missing diagnostic code and was filled by the pharmacy. Patient is asking if prescription could be sent again with the diagnostic code.   [facility] prescription to pharmacy: [id number]  [address]  [place], [place]  [address]  Ph: [phone] or [phone]  Fax: [phone]         best call back # [phone]      Follow-up #1  Details: Patient calling for update on refill issue above. Please reach out to update.     Call back # [phone]  By: Osie Bond (Patient Access Center) ~ October 13, 2019 9:34 AM      Medications:  BUDESONIDE 0.5 MG/2ML INHALATION SUSPENSION (BUDESONIDE) Use twice daily.  Rinse mouth with water after each use. icd 10  J44.9  #180[Milliliter] x 3   Route:INHALATION   Entered and Authorized by: Elliot Cousin MD   Signed by: Elliot Cousin MD on 10/13/2019   Method used: Electronically to      [id number]  [address]  [place], [place]  [address]  Ph: [phone] or [phone]  Fax: [phone]   Note to Pharmacy: Route: INHALATION;    RxID: [id number]        Medications:  Added new medication of BUDESONIDE 0.5 MG/2ML INHALATION SUSPENSION (BUDESONIDE) Use twice daily.  Rinse mouth with water after each use. icd 10  J44.9; Route: INHALATION - Signed  Rx of BUDESONIDE 0.5 MG/2ML INHALATION SUSPENSION (BUDESONIDE) Use twice daily.  Rinse mouth with water after each use. icd 10  J44.9; Route: INHALATION  #180[Milliliter] x 3;  Signed;  Entered by: Elliot Cousin MD;  Authorized by: Elliot Cousin MD;  Method used: [phone][facility] [address], [place], [place]  [address], Ph: [phone] or [phone], Fax: [phone]; Note to Pharmacy: Route:  INHALATION;

## 2019-10-16 ENCOUNTER — Ambulatory Visit

## 2019-10-24 ENCOUNTER — Ambulatory Visit

## 2019-10-24 NOTE — Telephone Encounter (Signed)
 Phone Note -     Patient    Routine    Initial call taken by: Zachery Conch Ruisseaux (Patient Access Center),  October 24, 2019 9:15 AM  Initial Details of Call:  Patient is calling in to check on the status of his referral. Patient is also requesting to know if he can have [place]. Please follow up    Call back#[phone]      Follow-up #1  Details: Patient calling asking if he can have CT scan done in [place] since he is moving. Patient stated that he is leaving for [place] on 10-29-2019.   Please assist    Best call back # [phone]    By: Ileene Patrick (Patient Access Center) ~ October 27, 2019 9:29 AM    Details: informed pt to call insurance company to see if they will cover CT in [facility]   By: Link Snuffer ~ October 27, 2019 11:06 AM    Follow-up #2  Details: pt states he is moving to [place] full time, informed pt that he will need to check with new pulmonary doctor and insurance company about CT scan in [place]  By: Link Snuffer ~ October 27, 2019 11:13 AM

## 2019-10-27 ENCOUNTER — Ambulatory Visit

## 2019-10-27 NOTE — Telephone Encounter (Signed)
 Phone Note -     Patient    Routine    Call back at [facility] [phone]  Initial call taken by: Ileene Patrick (Patient Access Center),  October 27, 2019 9:34 AM  Actual Caller: Patient  Call For: Nurse  Initial Details of Call:  Patient scheduled appointment to go over his CT scan results .     Patient has been scheduled for :CT Scan Follow up   Date of appointment:12-12-2019  Scheduled with: Dr. Marcheta Grammes  Insurance verified and active     Best call back # [phone]      Follow-up #1  Details: noted  By: Link Snuffer ~ October 27, 2019 9:42 AM

## 2019-10-27 NOTE — Telephone Encounter (Signed)
 Phone Note -     Patient    Routine    Call back at [facility] [phone]  Initial call taken by: Ileene Patrick (Patient Access Center),  October 27, 2019 9:26 AM  Actual Caller: Patient  Call For: Nurse  Initial Details of Call:  Patient calling asking if medical records have been sent to [place]. Informed patient that he will need to sign medical record release form. Patient stated that he called Dr. Marcheta Grammes office 10-22-2019 requesting medical records. Patient is leaving for [place] this Wednesday and wants to know the status of medical records.  Please call patient to let him know if he needs to sign medical release form. Patient stated that he can come into office today 10-27-2019 to sign medical release form.     Best call back # [phone]      Follow-up #1  Details: informed pt to come into office before 4 pm to sign form.   ByLink Snuffer ~ October 27, 2019 9:43 AM

## 2019-10-27 NOTE — Telephone Encounter (Signed)
 Phone Note -     Patient    Routine    Call back at [facility] [phone]  Initial call taken by: Link Snuffer,  October 27, 2019 9:44 AM  Initial Details of Call:  pt is wondering if it is safe for him to receive third covid booster shot, please advise       Follow-up #1  Details: Dr. Marcheta Grammes does reccomend getting the booster for Covid   By: Leotis Pain [place] ~ October 28, 2019 2:39 PM    Details: called patient and told him that it is best that he gets the booster   By: Leotis Pain [place] ~ October 29, 2019 11:44 AM

## 2019-12-05 ENCOUNTER — Ambulatory Visit

## 2019-12-11 ENCOUNTER — Ambulatory Visit

## 2019-12-11 ENCOUNTER — Ambulatory Visit: Admitting: Pulmonary Disease

## 2019-12-11 NOTE — Telephone Encounter (Signed)
 Phone Note -     Patient    Routine    Call back at [facility] [phone]  Initial call taken by: Link Snuffer,  December 11, 2019 12:45 PM  Initial Details of Call:  Called pt to confirm appt. Pt confirmed.

## 2019-12-12 ENCOUNTER — Ambulatory Visit

## 2019-12-12 NOTE — Progress Notes (Signed)
 Visit Type:  Follow-up Visit  Referring Provider:  Dr. Lynda Rainwater, MD  Primary Provider:  [name]      History of Present Illness:  Justin Navarro is a 79 Year Old Male who is here today for a follow up. Reports breathing issues began in June of 2021. Complains of productive cough with light yellow phlegm. Denies any fever or chills. Pt has hx of smoking, smoked about 2-3PPD for 25 years. Complaint with symbicort, incruse, and albuterol rescue inhaler (3-4x daily). Family reports his condition is improved with steroid.    Pt travels back and forth from [place] to [place] every few weeks.    Discussed recent CT results (12/11/19) today in depth with Pt. Conversed about possible next steps: 1) complete EBUS bronch, tissue biopsy; 2) Closely monitor with f/u CTs.       [facility] (12/11/19)  IMPRESSION:      1.  Very slight interval increase in maximal dimensions of the      spiculated nodule within the left upper lobe, as described above. This      remains highly concerning for primary cancer.      2.  Interval decrease in size of the peripheral right lower lobe      nodule. Given slight decrease in size, this finding could potentially      be inflammatory, but small neoplastic pulmonary nodules can wax and      wane in size. This remains highly suspicious for additional site lung      neoplasm.      3.  No convincing mediastinal adenopathy identified.      4.  Airway thickening and centrilobular emphysema.      5.  Coronary atherosclerosis.  PET CT 07/23/19    IMPRESSION:      Interval resolution of the previously identified nodule in the right      lower lobe pulmonary nodule, which was likely infectious or      inflammatory.    CT 07/01/19   IMPRESSION:       1.  Subpleural right lower lobe spiculated mass measuring 1.3 x 1.1 x      1.3 cm.      2.  2. Peribronchial thickening in the left upper lobe associated with      ill-defined masslike opacity measuring 1.3 x 1.1 x 1.4 cm thick distal      the  postinfectious or inflammatory in etiology but given the presence      of a masslike opacity in the right lower lobe, a second focus of      neoplastic change cannot be excluded.       These results were discussed by telephone with Dr. Caffie Damme      covering for Dr. [name] on 07/01/2019 11:03 AM.       Recommend consideration of CT, PET-CT, or tissue sampling in 3 months      according to Fleischner Society guidelines.       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)  HX OF PNEUMONIA (ICD10-Z87.01)  PREDIABETES=A1C OF 6.2 (ICD10-R73.09)  BMI 36-36.9 ADULT (ICD10-Z68.36)  MORBID OBESITY (ICD10-E66.01)  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)  COLONIC POLYPS, HX OF (ICD10-Z86.010)  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)  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)  ASTHMA, 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)  TESTOSTERONE 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)  ERECTILE DYSFUNCTION (ICD10-N52.9)  GOUT (ICD10-M10.9)  HYPERLIPIDEMIA (ICD10-E78.5)  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5 (3) MG/3ML INHALATION SOLUTION: use in neb machine four times daily  COPD   Dx J44.9  CELEBREX 200 MG ORAL CAPSULE (CELECOXIB): Take one po daily DO NOT TAKE IF TAKING INDOCIN  VENTOLIN HFA 108 (90 Base) MCG/ACT INHALATION AEROSOL SOLUTION (ALBUTEROL SULFATE): Take two whiffs q 4 hrs as needed for wheezing  MIRTAZAPINE 15 MG TABLET: TAKE 1 TABLET BY MOUTH AT BEDTIME AS NEEDED FOR INSOMNIA  LORAZEPAM 1 MG ORAL TABLET: Take one by mouth two times daily as needed for anxiety  CLOTRIMAZOLE-BETAMETHASONE 1-0.05 % EXTERNAL CREAM: Apply to rash two times daily  PREDNISONE 10 MG ORAL TABLET: 4 tabs daily for 3 days, 3 tabs daily for 3 days, 2 tabs daily for 3 days and 1 tab daily for 3 days  PANTOPRAZOLE SODIUM 40 MG ORAL TABLET DELAYED RELEASE: Take one by mouth two times daily for swallowing  TAMSULOSIN HCL 0.4 MG ORAL CAPSULE: Take one by mouth two times daily  PREDNISONE 10 MG ORAL TABLET: 4 tabs daily x 3 days, then 3 tabs daily x 3 days, then 2 tabs daily x 3 days, then 1 tab daily x 3 days  CEFPODOXIME PROXETIL 100 MG ORAL TABLET: 100mg  by mouth twice daily for 7 days.  SPIRIVA RESPIMAT 1.25 MCG/ACT INHALATION AEROSOL SOLUTION (TIOTROPIUM BROMIDE MONOHYDRATE): 2 puffs once daily  PREDNISONE 10 MG ORAL TABLET: 4 tabs daily x 3 days, then 3 tabs daily x 3 days, then 2 tabs daily x 3 days, then 1 tab daily x 3 days  DOXYCYCLINE HYCLATE 100 MG ORAL TABLET: Take one capsule by mouth twice daily for 7 days, for infection.  INCRUSE ELLIPTA 62.5 MCG/INH INHALATION AEROSOL POWDER BREAT (UMECLIDINIUM BROMIDE): 1 puff daily: Rinse mouth thoroughly with water after use.  CEFPODOXIME PROXETIL 100 MG ORAL TABLET: 100mg  by mouth twice daily for 7 days.  BUDESONIDE 0.5 MG/2ML INHALATION SUSPENSION: Use twice daily.  Rinse mouth with water after each use.  BUDESONIDE 0.5 MG/2ML INHALATION SUSPENSION: Use twice daily.  Rinse mouth with water after each use.  PREDNISONE 10 MG ORAL TABLET: 4 tabs  daily x 3 days, then 3 tabs daily x 3 days, then 2 tabs daily x 3 days, then 1 tab daily x 3 days  BUDESONIDE 0.5 MG/2ML INHALATION SUSPENSION: Use twice daily.  Rinse mouth with water after each use. icd 10  J44.9  PREDNISONE 10 MG ORAL TABLET: 4 tabs daily x 3 days, then 3 tabs daily x 3 days, then 2 tabs daily x 3 days, then 1 tab daily x 3 days  AZITHROMYCIN 250 MG ORAL TABLET: 500 mg by mouth on day 1, then 250 mg once daily for 4 days.  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  appendectomy age 49  s/p T and A age 7 and age 89    Family History  Father:  died age 49, cancer (oral) and cancer of colon, alcoholic  Mother:  died age 87, cancer of breast, alcoholic  Siblings:  one sister COPD, alcoholic    Social History  Marital Status:  divorced  Children: one son age 18  Lives With: girlfriend [name] in winter  Occupation: retired,   former Company secretary --retired age 59--1995     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   Eyes: Denies visual change or blurring, eye  pain.   Ears/Nose/Throat: Denies earache, decreased hearing, difficulty swallowing.   Cardiovascular: Denies chest pain or pressure, palpitations, shortness of breath.   Respiratory: Denies dry cough, productive cough, shortness of breath, wheezing.   Gastrointestinal: Denies acid indigestion, nausea, vomiting, diarrhea, abdominal pain, change in bowel habits, constipation, mucous or blood in stools.   Musculoskeletal: Denies muscle cramps or aches, muscle weakness, morning stiffness, joint pain, joint swelling.   Skin: Denies dry skin, rash, skin ulcers, suspicious lesions.   Psychiatric: Denies anxiety, depression, insomnia.     Vital Signs     Patient: 79 Years Old Male  Height:  70.6 in.  Weight: 253 lbs      Wt Chg: -2 since 10/09/2019  BMI:  35.82        36.10 on 10/09/2019  BP:  142/79     137/72 on 10/09/2019   Temp:  97.11  F      Pulse:  87         Pulse Ox: 94 %    Patient is not experiencing pain  Are you having trouble paying for your medications?  No    Medications and Allergies Reviewed    Signed: Leotis Pain McAdoo.Marland KitchenMarland KitchenMarland KitchenNovember 12, 2021 9:59 AM          Physical Exam    General:      well developed, well nourished, in no acute distress.    Chest Wall:      no deformities or breast masses noted.    Lungs:      clear bilaterally to auscultation.    Abdomen:       normal bowel sounds; no hepatosplenomegaly no ventral,umbilical hernias or masses noted.    Msk:      no deformity or scoliosis noted of thoracic or  lumbar spine.    Extremities:      no clubbing, cyanosis, edema, or deformity noted with normal full range of motion of all joints.    Psych:      alert and cooperative; normal mood and affect; normal attention span and concentration.           Assessment and Plan:     ~LUNG NODULES (R91.8)  ~DYSPNEA ON EXERTION (R06.09)        ~COPD  ~ABNORMAL CHEST CT (R91.8)    Reports breathing issues began in June of 2021. Complains of productive cough with light yellow phlegm. Denies any fever or chills. Pt has  hx of smoking, smoked about 2-3PPD for 25 years. Complaint with symbicort, incruse, and albuterol rescue inhaler (3-4x daily). Family reports his condition is improved with steroid.    Discussed adding daliresp with its suicidal ideation side effect, prednisone for wheezing.  Discussed recent CT results (12/11/19) today in depth with Pt. Conversed about possible next steps: 1) complete EBUS bronch, tissue biopsy; 2) Closely monitor with f/u CTs.     At the time of OV, Pt would like to schedule an EBUS bronch for after the Thanksgiving Holiday (in about 3 weeks).     Will add daliresp today.  Prednisone taper, azithromycin abx.  Continue symbicort, incruse, albuterol rescue inhaler as needed.  Encouraged completing PFT.  Will schedule follow up with Pt's family on EBUS bronch biopsy.     -Pt's family would like for the follow up contact to be with her and the Pt.        -Encouraged Pt to call back if any changes need to be made.    Patient/Caregiver understand instructions and plan.    Changes to Medication List Documented Today:   PREDNISONE 10 MG ORAL TABLET (PREDNISONE) 4 tabs daily x 3 days, then 3 tabs daily x 3 days, then 2 tabs daily x 3 days, then 1 tab daily x 3 days; Route: ORAL  AZITHROMYCIN 250 MG ORAL TABLET (AZITHROMYCIN) 500 mg by mouth on day 1, then 250 mg once daily for 4 days.; Route: ORAL  DALIRESP 250 MCG ORAL TABLET (ROFLUMILAST) by mouth once on Mon, Wed and Fri; Route: ORAL  Orders:   Added new Service order of Patient Encounter ([id number]) - Signed        Medications:  DALIRESP 250 MCG ORAL TABLET (ROFLUMILAST) by mouth once on Mon, Wed and Fri  #7[Tablet] x 2   Route:ORAL   Entered and Authorized by: Elliot Cousin MD   Signed by: Elliot Cousin MD on 12/12/2019   Method used: Electronically to      [facility] [phone]*  [address]  [place], [place]  [address]  Ph: [phone]  Fax: [phone]   Note to Pharmacy: Route: ORAL;    RxID: [id number]  AZITHROMYCIN 250 MG ORAL TABLET  (AZITHROMYCIN) 500 mg by mouth on day 1, then 250 mg once daily for 4 days.  #6[Tablet] x 0   Route:ORAL   Entered and Authorized by: Elliot Cousin MD   Signed by: Elliot Cousin MD on 12/12/2019   Method used: Electronically to      [facility] [phone]*  [address]  [place], [place]  [address]  Ph: [phone]  Fax: [phone]   Note to Pharmacy: Route: ORAL;    RxID: [id number]  PREDNISONE 10 MG ORAL TABLET (PREDNISONE) 4 tabs daily x 3 days, then 3 tabs daily x 3 days, then 2 tabs daily x 3 days,  then 1 tab daily x 3 days  #30[Tablet] x 0   Route:ORAL   Entered and Authorized by: Elliot Cousin MD   Signed by: Elliot Cousin MD on 12/12/2019   Method used: Electronically to      [facility] [phone]*  [address]  [place], [place]  [address]  Ph: [phone]  Fax: [phone]   Note to Pharmacy: Route: ORAL;    RxID: [id number]        By signing my name below, I, Melvyn Novas, attest that this documentation has been prepared under the direction and in the presence of Elliot Cousin MD.   Electronically Signed: Melvyn Novas  December 12, 2019 10:11 AM    All medical record entries made by the Scribe were at my direction and personally dictated by me. I have reviewed the chart and agree that the record accurately reflects my personal performance of the history, physical examination, assessment and plan. I have also personally dictated, reviewed, and agree with the discharge instructions. The patient verbally agreed to the scribe.  JingliMD(December 12, 2019 10:52 AM)

## 2019-12-12 NOTE — Progress Notes (Signed)
Receipt of: Pulmonary Office Visit    The following were sent to 'Antuane [email or web address]' at [email or web address] on 12/12/2019 10:59:40 AM:     - Secure message created from ACM template     - Attachment created from ACM template

## 2019-12-15 ENCOUNTER — Ambulatory Visit: Admitting: Pulmonary Disease

## 2019-12-15 ENCOUNTER — Ambulatory Visit

## 2019-12-15 NOTE — Telephone Encounter (Signed)
 Phone Note -       Initial call taken by: Elliot Cousin MD,  December 15, 2019 9:10 AM  Initial Details of Call:  discussed with Dr. Gay Filler; will schedule an EBUS AND [facility]

## 2019-12-29 ENCOUNTER — Ambulatory Visit

## 2020-01-05 ENCOUNTER — Ambulatory Visit

## 2020-01-05 NOTE — Op Note (Signed)
 OPERATIVE REPORT                Rpt # [id number]                              [facility]                                 [address]                                  [place], [place] [address]                                    [phone]  Patient:            Justin Navarro, Justin Navarro            DOB:                Oct 26, 2040  Pt. Location:       M[facility]       MR #:               [id number]         Account # [id number]  Admission Date:     01/05/20   Attending M.D.:     Kristopher Glee MD               Primary Care:       Lynda Rainwater MD              Report Status:      Signed     ______________________________________________________________________                                                                  OPERATIVE REPORT                   Pulmonology  Patient Name: Justin Navarro, Justin Navarro  Procedure Date: 01/05/2020 2:15 PM  MRN: [id number]  Account Number: [id number]  Date of Birth: 1940-12-14  Admit Type: Outpatient  Age: 79  Gender: Male  Note Status: Finalized  Attending MD: Kristopher Glee , MD  Procedure:            Bronchoscopy  Indications:          Left upper lobe nodule  Providers:            Kristopher Glee, MD (Doctor), Elliot Cousin, MD                         (Doctor)  Referring MD:         Elliot Cousin, MD  Medicines:            See the Anesthesia note for documentation of the                         administered medications  Estimated Blood Loss: Estimated blood loss was minimal.  Complications:  No immediate complications  Procedure:            Pre-Anesthesia Assessment:                        - A History and Physical has been performed.                         Patient meds and allergies have been reviewed.                         The risks and benefits of the procedure and the                         sedation options and risks were discussed with                         the patient. All questions were answered and                         informed consent was obtained. Patient                          identification and proposed procedure were                         verified prior to the procedure by the                         physician, the nurse and the anesthesiologist in                         the pre-procedure area in the procedure room at                         11:00 AM. Mental Status Examination: normal.                         Airway Examination: normal oropharyngeal airway.                         Respiratory Examination: clear to auscultation.                         CV Examination: normal and RRR, no murmurs, no                         S3 or S4. ASA Grade Assessment: III - A patient                         with severe systemic disease. After reviewing                         the risks and benefits, the patient was deemed                         in satisfactory condition to undergo the                         procedure. The  anesthesia plan was to use                         general anesthesia. Immediately prior to                         administration of medications, the patient was      Rpt # [id number]  [facility]     OPERATIVE REPORT                   Patient: Justin Navarro, Justin Navarro            MR #:    [id number]    __________________________________________________________________________                           re-assessed for adequacy to receive sedatives.                         The heart rate, respiratory rate, oxygen                         saturations, blood pressure, adequacy of                         pulmonary ventilation, and response to care were                         monitored throughout the procedure. The physical                         status of the patient was re-assessed after the                         procedure.                        After obtaining informed consent, the                         bronchoscope was introduced through the mouth,                         via the endotracheal tube and advanced to the                          tracheobronchial tree. The procedure was                         accomplished without difficulty. The patient                         tolerated the procedure well. The total duration                         of the procedure was 60 minutes. The patient                         tolerated the procedure well.  Findings:             The endotracheal tube is in good position. The  visualized portion of the trachea is of normal                         caliber. The carina is sharp. The                         tracheobronchial tree was examined to at least                         the first subsegmental level. Bronchial mucosa                         and anatomy are normal; there are no                         endobronchial lesions, and no secretions.                        An endobronchial ultrasound endoscope was                         utilized in order to assist with fine needle                         aspiration in the mediastinum. No mediastinalLN                         enlargemnt except the 11L lymph node (10 mm) .                         FNA was obtained and sent for cytology.                        Electromagnetic navigation bronchoscopy                         utilizing the [employer] system was                         performed. The CT scan was used for planning                         purposes. A virtual bronchoscopic image was                         generated using the planning software and the                         carina and left lower lobe basilar segment                         registration points were marked on the virtual                         image. The target in the apical-posterior                         segment of the left upper lobe was marked. A  nodule 10 mm in size was found and a pathway was                         created. After a complete airway exam, the                         locatable guide/extended working channel was                          inserted and the pre-planned fiducial landmarks,                         including left main bronchus carina and left                         lower lobe basilar segment were registered.                         After registration, an average CT to body                         divergence of 8 mm was achieved. The navigation                         phase was then begun to locate the target                         lesion(s). The locatable guide was removed from                         the extended working channel.                        Navigation guided brushings of a nodule were                         obtained in the apical-posterior segment of the                         left upper lobe with a cytology brush and sent                         for routine cytology. One sample was obtained.     Rpt # [id number]  [facility]     OPERATIVE REPORT                   Patient: Justin Navarro, Justin Navarro            MR #:    [id number]    __________________________________________________________________________                           Bronchoalveolar lavage was performed in the LUL                         apical posterior segments (B1 & B2) of the lung                         and sent for cell count, bacterial culture,  viral smears & culture, and fungal & AFB                         analysis and cytology. 100 mL of fluid were                         instilled. 70 mL were returned.  Impression:           - Left upper lobe nodule                        - The airway examination was normal.                        - Bronchoalveolar lavage was performed.                        - Endobronchial ultrasound was performed. No                         mediastinalLN enlargemnt except the 11L lymph                         node (10 mm) . FNA was obtained and sent for                         cytology.                        - Electromagnetic navigation bronchoscopy was                          performed.                        - Navigation guided brushings of a nodule were                         obtained in the apical-posterior segment of the                         left upper lobe with a cytology brush and sent                         for routine cytology.                        -BAL was obtained from the LUL posterior segment                         and sent for cytology and micro.                        .  Recommendation:       - Await BAL, brushing, culture and cytology                         results.  Kristopher Glee, MD  01/05/2020 2:25:48 PM  Dictated by:  This report has been signed electronically.  Elliot Cousin, MD  Number of Addenda: 0  Note Initiated On: 01/05/2020 2:15 PM  I have reconciled the patient's medications based on today's        findings.       Endoscopy Unit                                        ELECTRONICALLY SIGNED                                       Kristopher Glee, MD                                                 01/05/20    1415

## 2020-01-08 ENCOUNTER — Ambulatory Visit

## 2020-01-08 NOTE — Telephone Encounter (Signed)
 Phone Note -     Patient    **Refill Request Only**    Call back at [facility] [phone]  Initial call taken by: Link Snuffer,  January 08, 2020 1:40 PM  Initial Details of Call:  pt is wondering if he needs a daliresp refill?      Medications:  Daliresp 250 mcg tablet (roflumilast) 1 tablet by mouth once a day   #30 tablet x 1   Entered and Authorized by: Elliot Cousin MD   Signed by: Elliot Cousin MD on 01/09/2020   Method used: [facility]        [phone] [address], [place], [place] [address], Ph: [phone] Fax: [phone]     RxID: [id number]  levofloxacin 500 mg tablet (levofloxacin) Take 1 tablet by mouth once a day   #7 tablet x 0   Entered and Authorized by: Elliot Cousin MD   Signed by: Elliot Cousin MD on 01/09/2020   Method used: [facility]ically [facility]        [id number] [address] [place], [place] [address], Ph: [phone] Fax: [phone]     RxID: [id number]        Medications:  Added new medication of levofloxacin 500 mg tablet (levofloxacin) Take 1 tablet by mouth once a day ; Route: BY MOUTH - Signed  Added new medication of Daliresp 250 mcg tablet (roflumilast) 1 tablet by mouth once a day ; Route: BY MOUTH - Signed  Removed medication of Daliresp 250 mcg tablet (roflumilast) 250 mcg by mouth three times a week ; Route: BY MOUTH - Signed  Rx of levofloxacin 500 mg tablet (levofloxacin) Take 1 tablet by mouth once a day ;  #7 tablet x 0;  Signed;  Entered by: Elliot Cousin MD;  Authorized by: Elliot Cousin MD;  Method used: [facility] [phone] [address] [place], [place] [address], Ph: [phone] Fax: [phone],   Rx of Daliresp 250 mcg tablet (roflumilast) 1 tablet by mouth once a day ;  #30 tablet x 1;  Signed;  Entered by: Elliot Cousin MD;  Authorized by: Elliot Cousin MD;  Method used: [facility] [phone] [address] [place], [place] [address], Ph: [phone] Fax: [phone],   Orders:  Added new Test order of CT - CHEST/THORAX WITHOUT CONTRAST (CTCHWO      ) - Signed

## 2020-01-08 NOTE — Telephone Encounter (Signed)
 Phone Note -       Initial call taken by: Link Snuffer,  January 08, 2020 1:42 PM  Initial Details of Call:  pt called asking if bronch results can be called into his daughter in [place], [phone]       Follow-up #1  Details: please  schedule a chest ct  By: Elliot Cousin MD ~ January 09, 2020 3:15 PM    Details: faxed order to [facility] and they will be in contact with patient to schedule   By: Leotis Pain Enlow ~ January 13, 2020 1:31 PM

## 2020-01-20 ENCOUNTER — Ambulatory Visit

## 2020-01-20 NOTE — Telephone Encounter (Signed)
 Phone Note -       Initial call taken by: Leotis Pain Niotaze,  January 20, 2020 11:12 AM  Initial Details of Call:  Patient called in reguards to the procedure that he had on the 6th of Dec and wanted to know if the rest of the results were in if so he would like a call please advise       Follow-up #1  Details: pt is in [place]; pt reported more dyspnea.   recommended pt to check his breathing in [place].   By: Elliot Cousin MD ~ January 20, 2020 5:37 PM

## 2020-02-03 ENCOUNTER — Ambulatory Visit

## 2020-02-03 NOTE — Telephone Encounter (Signed)
 Phone Note -     Patient    **Refill Request Only**    Initial call taken by: Justin Navarro ([facility]),  February 03, 2020 9:24 AM  Initial Details of Call:  pt has 3 days left of his med. and asking for a refill to be sent to the location he is in right now [place].    Patient is calling for refill of meds listed below.  [facility] [phone] [address] [place] [address]      LORAZAPEM    call back number: [phone]      The [place] on-line prescription monitoring program was reviewed prior to refilling this prescription and no inappropriate controlled subtances were dispensed.    08/04/2019 05/06/2019 1   Lorazepam 1 Mg Tablet  180 90 Justin Navarro [phone] [id number] 0/0 2.00 LME Medicare [place]      Previous Appointment:  12/12/2019  Justin Cousin MD      10/09/2019  Justin Cousin MD      07/28/2019  Justin Cousin MD      07/21/2019  Justin Mussel MD      07/21/2019  Justin Mussel MD      07/14/2019  Justin Cousin MD      06/24/2019  Justin Mussel MD      05/06/2019  Justin Danes NP      07/31/2018  Justin Mussel MD          Next Appointment: No future appointments recorded    Last BP:  142/79   (12/12/2019)    Last HGBA1C:  6.1    (07/21/2019)    BMP/CMP  Last BUN:     17    (07/21/2019),                Last EGFR:     67    (07/21/2019)  Last Creatinine:     1.07    (07/21/2019),      Last Sodium:   139    (07/21/2019)       Last Potassium:     4.7    (07/21/2019)    Last  Cholesterol:     181    (07/21/2019)     Last  LDL:     97    (07/21/2019),    Last  LDL Direct:     ()    Last  SGPT ALT:     38    (07/21/2019),  Last Albumin:     3.8    (07/21/2019)    CBC  Last WBC:     7.2    (07/21/2019)  Last Hgb:     14.5    (07/21/2019)  Last Platelet Count:     237 K/UL    (07/21/2019)    Last Digoxin Level:        ()    Last TSH:       (),  Last TSH Ultra:   0.625 MCLU/ML    (07/21/2019)      Last Urine Drug Screen: on .  Narcotic Medications:  LORAZEPAM 1 MG ORAL TABLET (LORAZEPAM)  - Started: 05/23/2005     LORAZEPAM 1 MG ORAL TABLET (LORAZEPAM)  - Started: 03/15/2006    LORAZEPAM 1 MG ORAL TABLET (LORAZEPAM)  - Started: 09/04/2011    LORAZEPAM 1 MG ORAL TABLET (LORAZEPAM)  - Started: 03/11/2012    LORAZEPAM 1 MG ORAL TABLET (LORAZEPAM)  - Started: 08/19/2012    LORAZEPAM  1 MG ORAL TABLET (LORAZEPAM)  - Started: 08/19/2012    LORAZEPAM 1 MG ORAL TABLET (LORAZEPAM)  - Started: 08/19/2012    LORAZEPAM 1 MG ORAL TABLET (LORAZEPAM)  - Started: 08/19/2012        Directives/Controlled Substance Agreement  HEALTH CARE PROXY   DISCUSED WITH PATIENT -- FULL CODE  06/26/2017 VERBAL CONSENT Justin Navarro (SON) [phone] 06/26/17  06/26/2017 VERBAL CONSENT AUTH Justin Navarro (DAUGHTER IN LAW) [phone] 06/26/17    PMP Appropriate __YES      ___No          Medications:  lorazepam 1 mg tablet (lorazepam) Take 1 tablet by mouth twice a day as needed   #180 tablet x 1   Entered and Authorized by: Justin Mussel MD   Signed by: Justin Mussel MD on 02/03/2020   Method used: [facility]        [id number], [place] [address], Ph: [phone] Fax: [phone]     RxID: [id number]        Medications:  Changed medication from lorazepam 1 mg tablet (lorazepam) Take 1 by mouth twice a day as needed  to lorazepam 1 mg tablet (lorazepam) Take 1 tablet by mouth twice a day as needed ; Route: BY MOUTH - Signed  Rx of lorazepam 1 mg tablet (lorazepam) Take 1 tablet by mouth twice a day as needed ;  #180 tablet x 1;  Signed;  Entered by: Justin Mussel MD;  Authorized by: Justin Mussel MD;  Method used: [facility] [phone] [address] [place], [place] [address], Ph: [phone] Fax: [phone],     Previous Appointment:  12/12/2019  Justin Cousin MD      10/09/2019  Justin Cousin MD      07/28/2019  Justin Cousin MD      07/21/2019  Justin Mussel MD      07/21/2019  Justin Mussel MD      07/14/2019  Justin Cousin MD      06/24/2019  Justin Mussel MD      05/06/2019  Justin Danes NP      07/31/2018  Justin Mussel MD          Next Appointment: No future appointments recorded    Last BP:  142/79   (12/12/2019)    Last HGBA1C:  6.1    (07/21/2019)    BMP/CMP  Last BUN:     17    (07/21/2019),                Last EGFR:     67    (07/21/2019)  Last Creatinine:     1.07    (07/21/2019),      Last Sodium:   139    (07/21/2019)       Last Potassium:     4.7    (07/21/2019)    Last  Cholesterol:     181    (07/21/2019)     Last  LDL:     97    (07/21/2019),    Last  LDL Direct:     ()    Last  SGPT ALT:     38    (07/21/2019),  Last Albumin:     3.8    (07/21/2019)    CBC  Last WBC:     7.2    (07/21/2019)  Last Hgb:     14.5    (07/21/2019)  Last  Platelet Count:     237 K/UL    (07/21/2019)    Last Digoxin Level:        ()    Last TSH:       (),  Last TSH Ultra:   0.625 MCLU/ML    (07/21/2019)      Last Urine Drug Screen: on .  Narcotic Medications:  LORAZEPAM 1 MG ORAL TABLET (LORAZEPAM)  - Started: 05/23/2005    LORAZEPAM 1 MG ORAL TABLET (LORAZEPAM)  - Started: 03/15/2006    LORAZEPAM 1 MG ORAL TABLET (LORAZEPAM)  - Started: 09/04/2011    LORAZEPAM 1 MG ORAL TABLET (LORAZEPAM)  - Started: 03/11/2012    LORAZEPAM 1 MG ORAL TABLET (LORAZEPAM)  - Started: 08/19/2012    LORAZEPAM 1 MG ORAL TABLET (LORAZEPAM)  - Started: 08/19/2012    LORAZEPAM 1 MG ORAL TABLET (LORAZEPAM)  - Started: 08/19/2012    LORAZEPAM 1 MG ORAL TABLET (LORAZEPAM)  - Started: 08/19/2012        Directives/Controlled Substance Agreement  HEALTH CARE PROXY   DISCUSED WITH PATIENT -- FULL CODE  06/26/2017 VERBAL CONSENT Justin Navarro (SON) [phone] 06/26/17  06/26/2017 VERBAL CONSENT Justin Navarro (DAUGHTER IN LAW) [phone] 06/26/17    PMP Appropriate __YES      ___No

## 2020-02-29 ENCOUNTER — Ambulatory Visit

## 2020-02-29 ENCOUNTER — Ambulatory Visit: Admitting: Internal Medicine

## 2020-03-10 IMAGING — CT CT CHEST WITHOUT CONTRAST
2 of 4 series · 15 of 36 positions shown, 18 images · non-contrast
Comparison: 09/10/2019

CT CHEST WITHOUT CONTRAST, 03/10/2020 [DATE]: 
CLINICAL INDICATION:  Follow-up pulmonary nodules. Negative biopsy 
A search for DICOM formatted images was conducted for prior CT imaging studies 
completed at a non-affiliated media free facility.
TECHNIQUE: The chest was scanned from base of neck through the lung bases 
without contrast on a high resolution low dose CT scanner. Routine MPR and MIP 
3D renderings were reconstructed on an independent workstation with concurrent 
physician supervision.

[Series 2: chest w/o 2.0 i31s 3 · axial · non-contrast · 0.98mm/px · z∈[-374,-66]mm · 12 of 184 slices shown, 15 images]
[im 15/184  mediastinal]
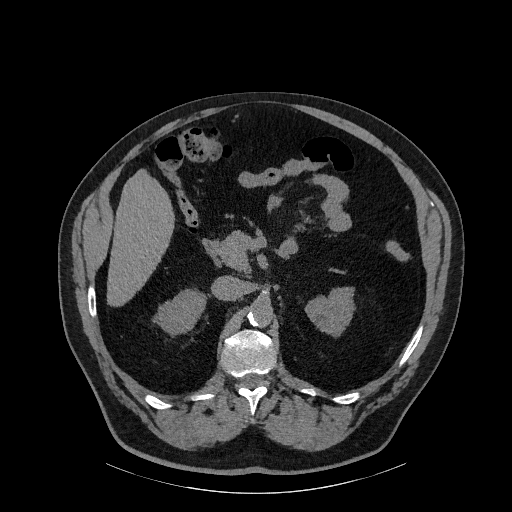
[im 15/184  lung]
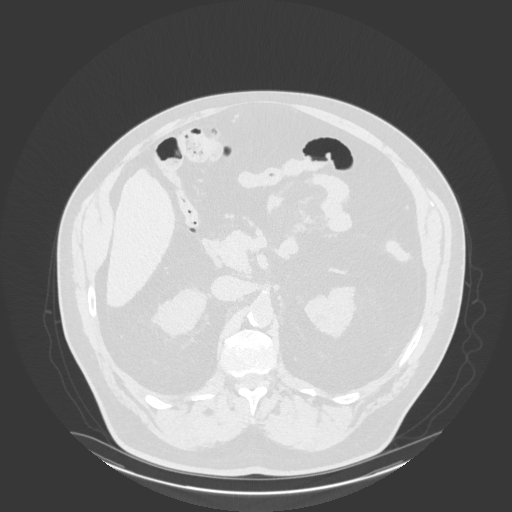
[im 29/184  lung]
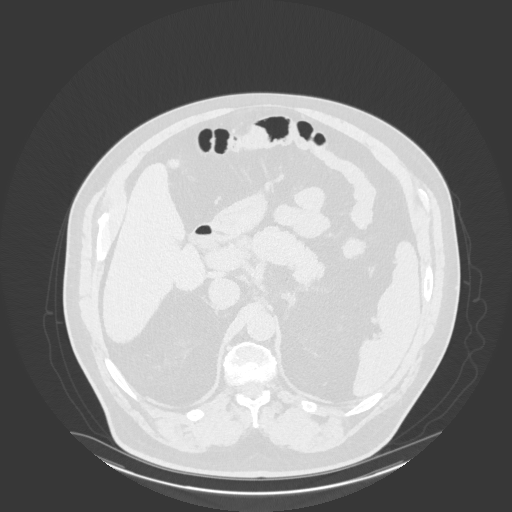
[im 43/184  lung]
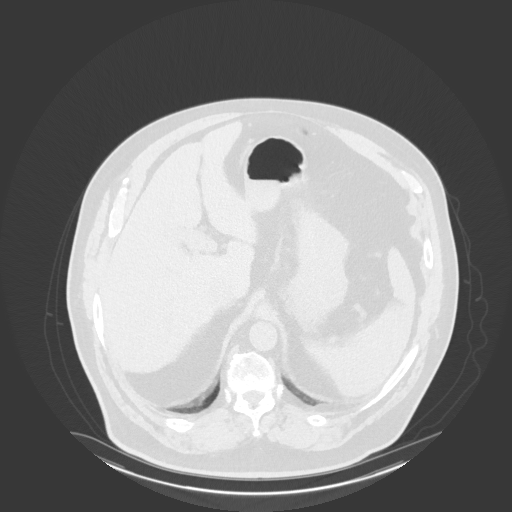
[im 57/184  lung]
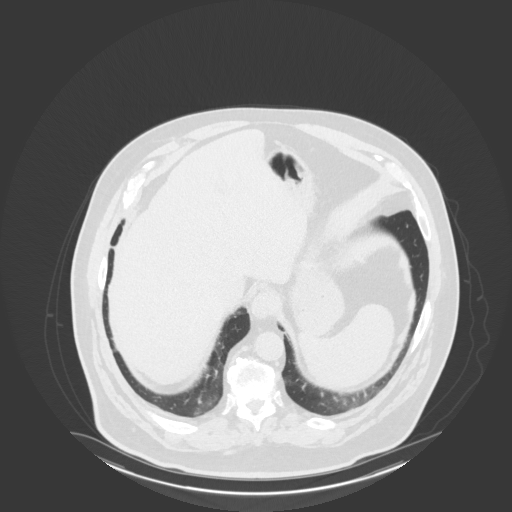
[im 71/184  mediastinal]
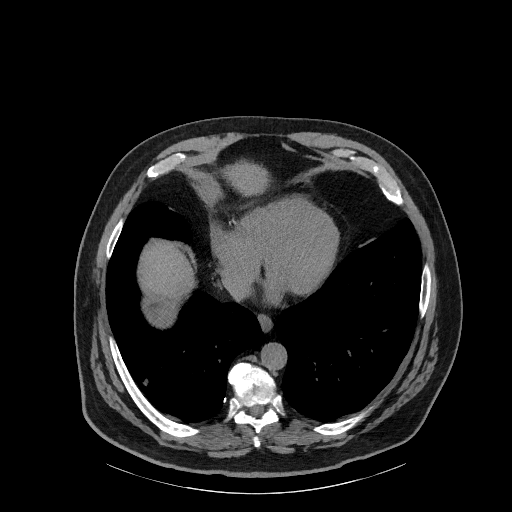
[im 71/184  lung]
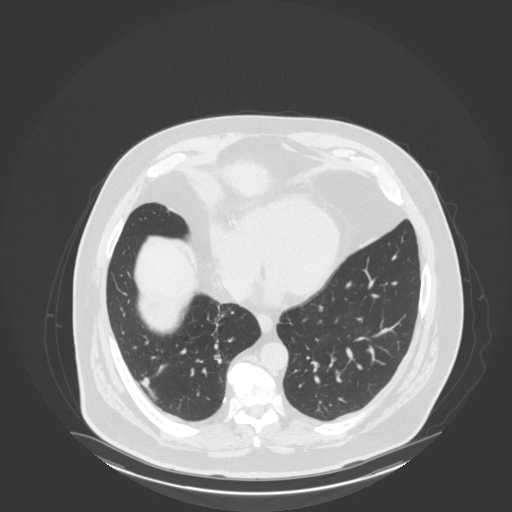
[im 85/184  lung]
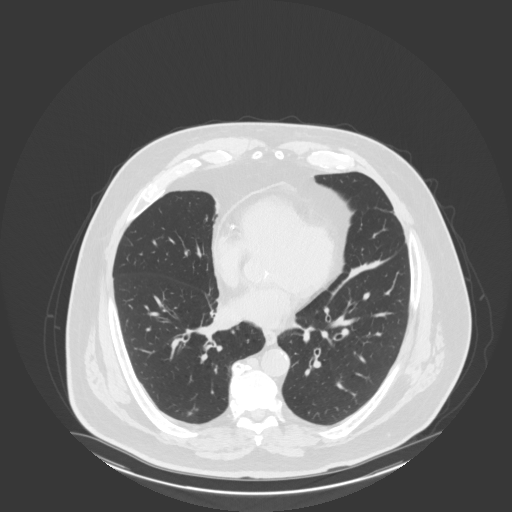
[im 99/184  lung]
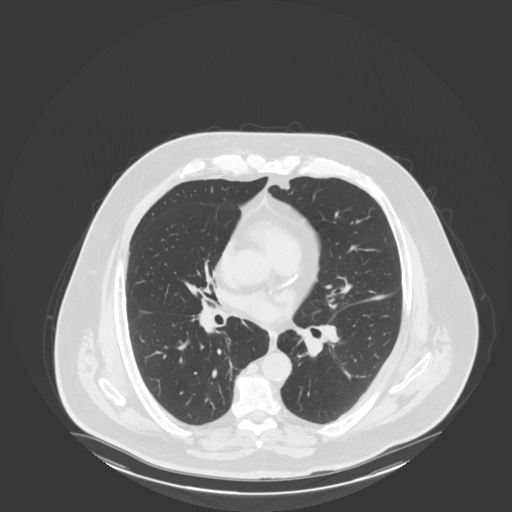
[im 113/184  lung]
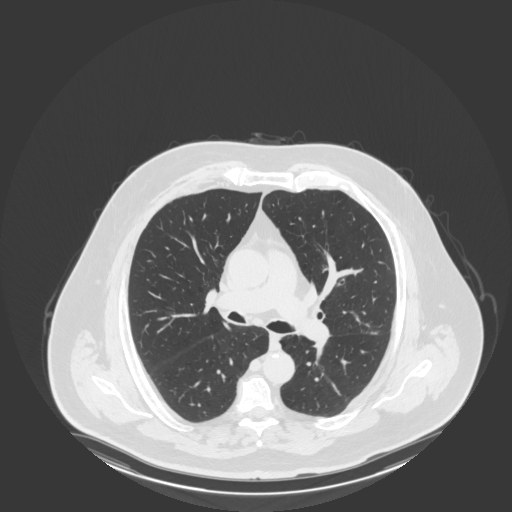
[im 127/184  mediastinal]
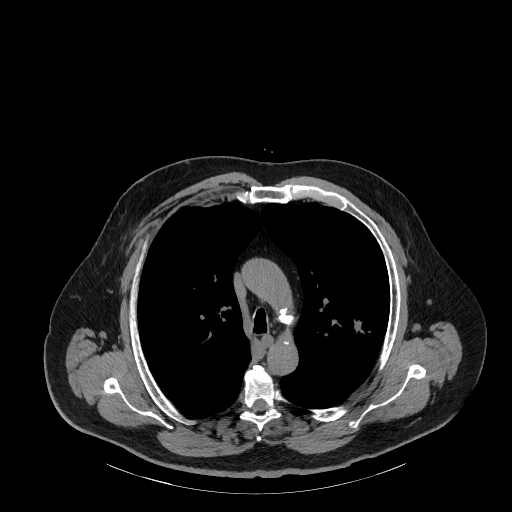
[im 127/184  lung]
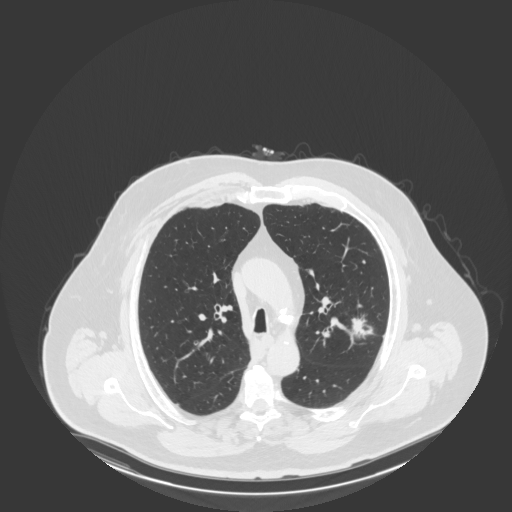
[im 141/184  lung]
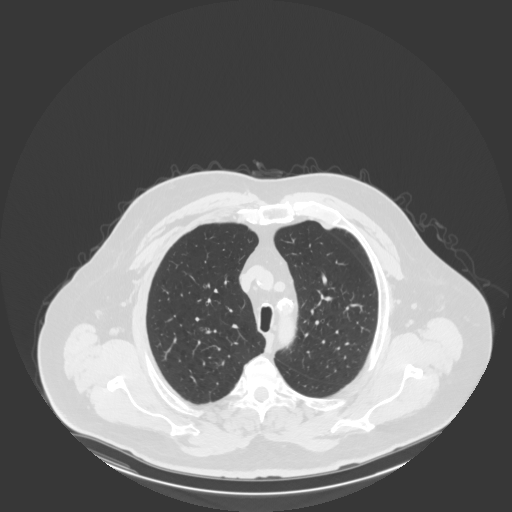
[im 155/184  lung]
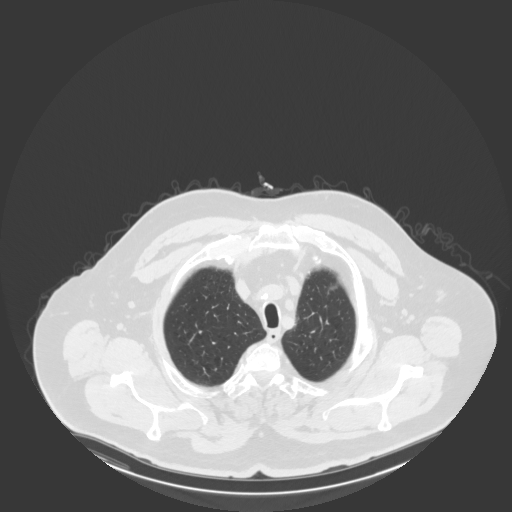
[im 169/184  lung]
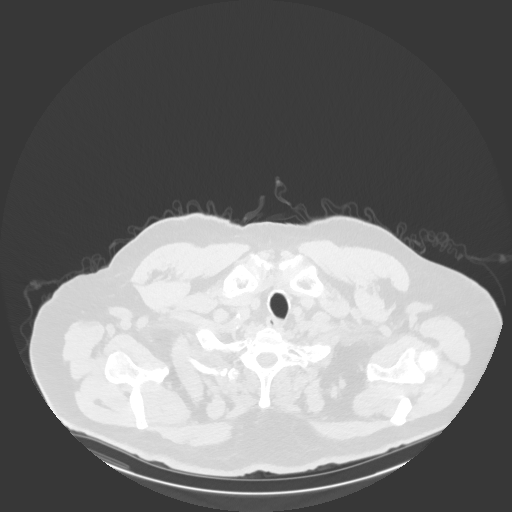

[Series 4: coronal · coronal · 0.76mm/px · 3 of 193 slices shown]
[im 39/193  lung]
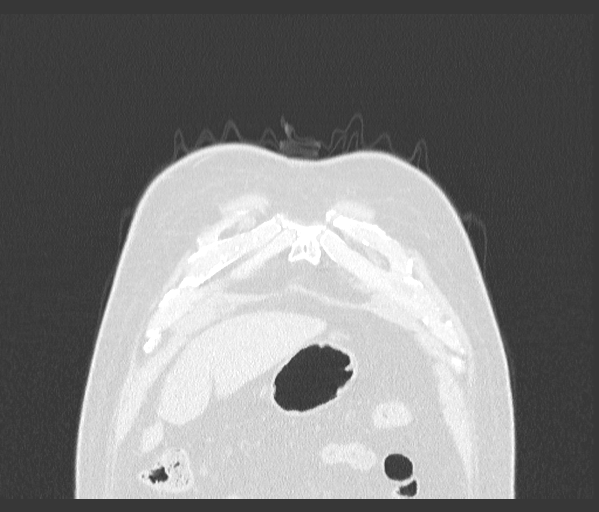
[im 77/193  lung]
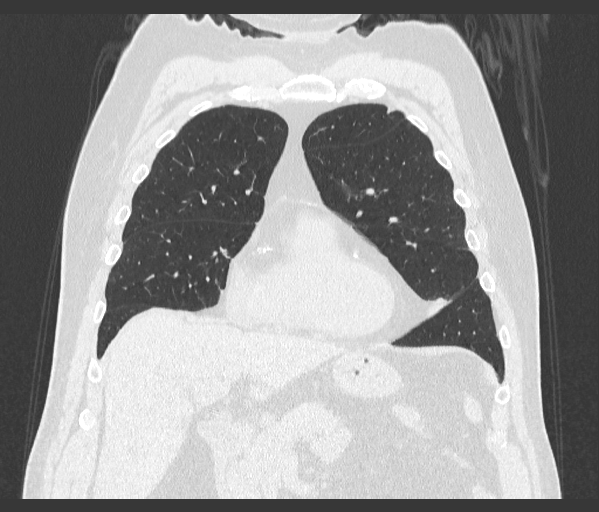
[im 116/193  lung]
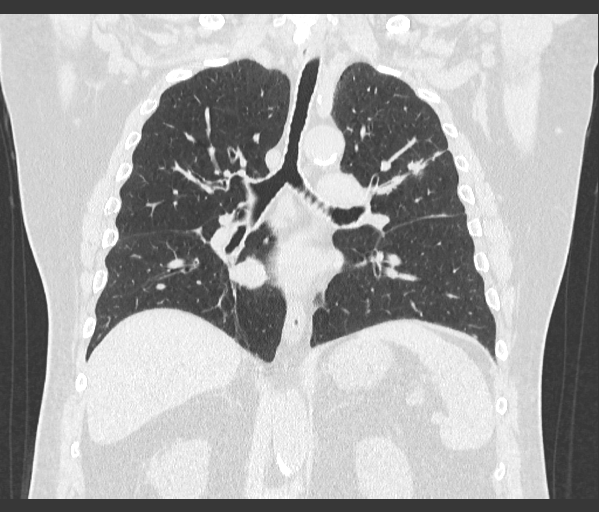

[15 of 36 positions shown; findings below may reference images not displayed]

FINDINGS: LUNGS AND PLEURA:  There are multiple nodules identified. Emphysematous changes 
are seen. The nodule in the right lower lobe posterior sulcus has actually 
improved. Previously at 13 mm currently on image 114 at 9 mm. Just above this on 
image 101 is a new 7 mm nodule not present previously. There is a spiculated 
mass within the left upper lobe previously 22 x 13 mm currently 22 x 15 mm on 
image 60. This also tracks then towards the hilum. I have difficulty determining 
if this is malignancy or inflammatory given the appearance extending toward the 
hilum. 
MEDIASTINUM:  Marked coronary calcifications. No suspicious adenopathy. 
CHEST WALL/AXILLA: No mass or adenopathy. 
UPPER ABDOMEN: Negative. 
MUSCULOSKELETAL: Degenerative changes thoracic spine.
IMPRESSION: Slightly enlarging left upper lobe lesion. I'm uncertain if this is the lesion 
that was previously biopsied. Recommend clinical correlation. This could be 
further evaluated with PET scan or biopsy if this is not the site of previous 
biopsy. 
Decreasing size right lower lobe nodule with new 7 mm nodule seen adjacent to 
this nodule. 
Emphysematous changes, atherosclerotic changes and degenerative changes. 
RADIATION DOSE REDUCTION: All CT scans are performed using radiation dose 
reduction techniques, when applicable.  Technical factors are evaluated and 
adjusted to ensure appropriate moderation of exposure.  Automated dose 
management technology is applied to adjust the radiation doses to minimize 
exposure while achieving diagnostic quality images.

## 2020-03-13 ENCOUNTER — Ambulatory Visit: Admitting: Internal Medicine

## 2020-03-13 ENCOUNTER — Ambulatory Visit

## 2020-03-21 ENCOUNTER — Ambulatory Visit

## 2020-03-21 ENCOUNTER — Ambulatory Visit: Admitting: Internal Medicine

## 2020-03-29 ENCOUNTER — Ambulatory Visit

## 2020-03-29 NOTE — Telephone Encounter (Addendum)
 Phone Note -       Initial call taken by: Justin Navarro (Patient [facility]),  March 29, 2020 9:23 AM  Initial Details of Call:  Patient is calling for refill of meds listed below.  Pharmacy is: [facility]   Ipratropium-albuterol 0.5 mg -3 mg 2.5 mg base 3mL solution for nebulization (iprqatropium- albuterol) use four times a day           Medications:  ipratropium-albuterol 0.5 mg-3 mg(2.5 mg base)/3 mL solution for nebulization (ipratropium-albuterol) Use 1 vial using nebulizer four times a day   #3 ml x 3   Entered and Authorized by: [name]   Signed by: [name] on 03/29/2020   Method used: Electronically to        [facility] [address] 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    RxID: [id number]        Medications:  Added new medication of ipratropium-albuterol 0.5 mg-3 mg(2.5 mg base)/3 mL solution for nebulization (ipratropium-albuterol) Use 1 vial using nebulizer four times a day ; Route: USING NEBULIZER - Signed  Removed medication of ipratropium-albuterol 0.5 mg-3 mg(2.5 mg base)/3 mL solution for nebulization (ipratropium-albuterol) Use four times a day  - Signed  Rx of ipratropium-albuterol 0.5 mg-3 mg(2.5 mg base)/3 mL solution for nebulization (ipratropium-albuterol) Use 1 vial using nebulizer four times a day ;  #3 ml x 3;  Signed;  Entered by: [name];  Authorized by: [name];  Method used: [facility] to [facility] [address] [place], [place], [place] [address], Ph: [phone] Fax: [phone], ; Note to Pharmacy: Please dispense 360 units of 3 ml each  Dx   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    Previous Appointment:  12/12/2019  Elliot Cousin MD      10/09/2019  Elliot Cousin MD      07/28/2019  Elliot Cousin MD      07/21/2019  [name]      07/21/2019  [name]      07/14/2019  Elliot Cousin MD       06/24/2019  [name]      05/06/2019  Warren Danes NP      07/31/2018  [name]          Next Appointment: No future appointments recorded    Last BP:  142/79   (12/12/2019)    Last HGBA1C:  6.1    (07/21/2019)    BMP/CMP  Last BUN:     17    (07/21/2019),                Last EGFR:     67    (07/21/2019)  Last Creatinine:     1.07    (07/21/2019),      Last Sodium:   139    (07/21/2019)       Last Potassium:     4.7    (07/21/2019)    Last  Cholesterol:     181    (07/21/2019)     Last  LDL:     97    (07/21/2019),    Last  LDL Direct:     ()    Last  SGPT ALT:  38    (07/21/2019),  Last Albumin:     3.8    (07/21/2019)    CBC  Last WBC:     7.2    (07/21/2019)  Last Hgb:     14.5    (07/21/2019)  Last Platelet Count:     237 K/UL    (07/21/2019)    Last Digoxin Level:        ()    Last TSH:       (),  Last TSH Ultra:   0.625 MCLU/ML    (07/21/2019)      Last Urine Drug Screen: on .  Narcotic Medications:  LORAZEPAM 1 MG ORAL TABLET (LORAZEPAM)  - Started: 05/23/2005    LORAZEPAM 1 MG ORAL TABLET (LORAZEPAM)  - Started: 03/15/2006    LORAZEPAM 1 MG ORAL TABLET (LORAZEPAM)  - Started: 09/04/2011    LORAZEPAM 1 MG ORAL TABLET (LORAZEPAM)  - Started: 03/11/2012    LORAZEPAM 1 MG ORAL TABLET (LORAZEPAM)  - Started: 08/19/2012    LORAZEPAM 1 MG ORAL TABLET (LORAZEPAM)  - Started: 08/19/2012    LORAZEPAM 1 MG ORAL TABLET (LORAZEPAM)  - Started: 08/19/2012    LORAZEPAM 1 MG ORAL TABLET (LORAZEPAM)  - Started: 08/19/2012        Directives/Controlled Substance Agreement  HEALTH CARE PROXY   DISCUSED WITH PATIENT -- FULL CODE  06/26/2017 VERBAL CONSENT Justin Navarro (SON) [phone] 06/26/17  06/26/2017 VERBAL CONSENT Justin Navarro (DAUGHTER IN LAW) [phone] 06/26/17    PMP Appropriate __YES      ___No        Created By Justin Navarro on 03/29/2020 at 09:23 AM    Electronically Signed By [name] on 03/29/2020 at 01:57 PM

## 2020-04-13 ENCOUNTER — Ambulatory Visit

## 2020-04-13 NOTE — Telephone Encounter (Signed)
 Phone Note -     Patient    ***URGENT***    Initial call taken by: Veneta Penton ([facility]),  April 13, 2020 1:46 PM  Actual Caller: Patient  Initial Details of Call:  Patient called in stating he would like to be seen for something that is going on to his testicle since yesterday. Please follow up  call back # [phone]      Follow-up #1  Details: spoke with pt, he is in [facility] booked appt for when he is home and advised if there is any changes he needs to go to [facility] and not wait to come home to [place]  By: Kyung Bacca ~ April 13, 2020 2:02 PM                Created By Veneta Penton on 04/13/2020 at 01:46 PM    Electronically Signed By Kyung Bacca on 04/13/2020 at 02:02 PM

## 2020-04-21 ENCOUNTER — Ambulatory Visit: Admitting: Internal Medicine

## 2020-04-21 ENCOUNTER — Ambulatory Visit

## 2020-04-21 NOTE — Telephone Encounter (Signed)
 Phone Note -       Call back at [facility] [phone]  Initial call taken by: Alfonso Patten. Jacinto Reap,  April 21, 2020 2:35 PM  Initial Details of Call:  We are asking you to attest for your own safety and the safety of our staff.    Fever  SOB  Headaches  New loss of smell/taste  Fatigue  New cough(not related to chronic condition)  New nasal congestion or runny nose (not related to seasonal allergies)  Muscle aches  Aches and Pains  Sore throat  Diarrhea  Have you been tested for COVID?  If so, Where (facility) ?  Date?         Covid Result:    Positive / Negative  Have you been vaccinated for COVID?  Anyone in house/contact positive for covid?  When?                    Created By Lowella Grip [place] on 04/21/2020 at 02:35 PM    Electronically Signed By Lowella Grip [place] on 04/21/2020 at 02:38 PM

## 2020-04-21 NOTE — Progress Notes (Signed)
 Visit Type:  Testicle pain   Referring Provider:  Dr. Lynda Rainwater, MD  Primary Provider:  [name]      History of Present Illness:  Justin Navarro is a 80 year old Male who presents today for: Testicle pain       testicle pain   he says he thinks something wrong   with anatomy    this was on left side      he is refusing [place]    Medications Prior to this Visit  budesonide 0.5 mg/2 mL suspension for nebulization (budesonide) Use twice a day   Symbicort 160-4.5 mcg/actuation HFA aerosol inhaler (budesonide-formoterol) Take 2 twice a day   Ventolin HFA 90 mcg/actuation HFA aerosol inhaler (albuterol sulfate) Take 2 every four hours as needed   clotrimazole-betamethasone 1-0.05% cream (clotrimazole-betamethasone) Apply to skin twice a day   Incruse Ellipta 62.5 mcg/actuation blister with device (umeclidinium) Inhale 1 puff by mouth once a day   pantoprazole 40 mg tablet,delayed release (DR/EC) (pantoprazole) Take 1 by mouth twice a day   levothyroxine 112 mcg tablet (levothyroxine) Take 1 tablet by mouth once a day   pravastatin 40 mg tablet (pravastatin) Take 1 by mouth every night   * COMPAIR NEBULIZER Use four times a day   Spiriva Respimat 1.25 mcg/actuation mist (tiotropium bromide) 2 puff once a day   levofloxacin 500 mg tablet (levofloxacin) Take 1 tablet by mouth once a day   Daliresp 250 mcg tablet (roflumilast) 1 tablet by mouth once a day   lorazepam 1 mg tablet (lorazepam) Take 1 tablet by mouth twice a day as needed   celecoxib 200 mg capsule (celecoxib) TAKE 1 CAPSULE DAILY. DO   NOT TAKE IF TAKING INDOCIN  mirtazapine 15 mg tablet (mirtazapine) TAKE 1 TABLET AT BEDTIME ASNEEDED FOR INSOMNIA BY     AUROBINDO  tamsulosin 0.4 mg capsule (tamsulosin) TAKE 1 CAPSULE BY MOUTH TWICE A DAY  ipratropium-albuterol 0.5 mg-3 mg(2.5 mg base)/3 mL solution for nebulization (ipratropium-albuterol) Use 1 vial using nebulizer four times a day   prednisone 10 mg tablet (prednisone) 4 tablet by mouth as  directed 4 tabs daily for 3 days, 3 tabs daily for 3 days, 2 tabs daily for 3 days and 1 tab daily for 3 days    Current Problems- Reviewed during today's visit  ABNORMAL EXAM OF TESTICLE (ICD10-N50.9)  LUNG NODULES (ICD10-R91.8)  COPD: ACUTE EXACERBATION (ICD10-J44.1)  DYSPNEA ON EXERTION (ICD10-R06.09)  ABNORMAL CHEST CT (ENI77-O24.8)  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)  LUNG MASS, RIGHT LOWER LOBE (RLL) (ICD10-J98.4)  PULMONARY INFILTRATES (ICD10-R91.8)  GERD (GASTROESOPHAGEAL REFLUX DISEASE) (ICD10-K21.9)  URINARY URGENCY (ICD10-R39.15)  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)  HX OF PNEUMONIA (ICD10-Z87.01)  PREDIABETES=A1C OF 6.2 (ICD10-R73.09)  MORBID OBESITY (ICD10-E66.01)  BMI 36-36.9 ADULT (ICD10-Z68.36)  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5 mcg/actuation HFA aerosol inhaler (budesonide-formoterol): Take 2 twice a day   Ventolin HFA 90 mcg/actuation HFA aerosol inhaler (albuterol sulfate): Take 2 every four hours as needed   Clotrimazole-betamethasone 1-0.05% cream: Apply  to skin twice a day   Incruse Ellipta 62.5 mcg/actuation blister with device (umeclidinium): Inhale 1 puff by mouth once a day   Pantoprazole 40 mg tablet,delayed release (DR/EC): Take 1 by mouth twice a day   Levothyroxine 112 mcg tablet: Take 1 tablet by mouth once a day   Pravastatin 40 mg tablet: Take 1 by mouth every  night   COMPAIR NEBULIZER: Use four times a day   Spiriva Respimat 1.25 mcg/actuation mist (tiotropium bromide): 2 puff once a day   Levofloxacin 500 mg tablet: Take 1 tablet by mouth once a day   Daliresp 250 mcg tablet (roflumilast): 1 tablet by mouth once a day   Lorazepam 1 mg tablet: Take 1 tablet by mouth twice a day as needed   Celecoxib 200 mg capsule: TAKE 1 CAPSULE DAILY. DO   NOT TAKE IF TAKING INDOCIN  Mirtazapine 15 mg tablet: TAKE 1 TABLET AT BEDTIME ASNEEDED FOR INSOMNIA BY     AUROBINDO  Tamsulosin 0.4 mg capsule: TAKE 1 CAPSULE BY MOUTH TWICE A DAY  Ipratropium-albuterol 0.5 mg-3 mg(2.5 mg base)/3 mL solution for nebulization: Use 1 vial using nebulizer four times a day   Prednisone 10 mg tablet: 4 tablet by mouth as directed 4 tabs daily for 3 days, 3 tabs daily for 3 days, 2 tabs daily for 3 days and 1 tab daily for 3 days    Current Allergies- Reviewed during today's visit  PENICILLIN (Critical)  * CT CONTRAST (Critical)  * BELSOMRA (Mild)    Current Directives- Reviewed during today's visit  HEALTH CARE PROXY  DISCUSED WITH PATIENT -- FULL CODE  VERBAL Justin Navarro (SON) [phone] 06/26/17  VERBAL CONSENT AUTH Justin Navarro (DAUGHTER IN LAW) [phone] 06/26/17    Past Medical History  remotely in three different detox  colonoscopy-2005-diverticulosis and hemorrhoids  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    Surgical History  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  appendectomy age 64  s/p T and A age 24 and age 30    Family History  Father:  died age 67, cancer (oral) and cancer of colon, alcoholic  Mother:  died age 55, cancer of breast, alcoholic  Siblings:  one sister COPD, alcoholic    Social History  Marital Status:  divorced-lives with gf when in [place]  Children: one son age 56  Lives With: girlfriend Justin Navarro in winter  Occupation: retired,   former Company secretary --retired age 4--1995     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           Risk Factors  Smoking Status: former smoker  Year quit: 1998  Pack-years: 40  Smoking Comments:  started smoking age [age]  Passive smoke exposure: No    Drug use: no  Alcohol use: no    Exercise: Yes  Times per week: 3  Exercise Comments:  walking and golf    Caffeine (drinks/day): 2  Sun exposure: rarely  Seatbelt use (%): 100  Family History MI in male age < 16: no  Family History MI in male age < 4: no  Bladder Control  issues: no  Safety concerns: no  Falls Information:  Past year - no        Review of Systems   General: Denies fever, chills, sweats, anorexia, fatigue, weakness, malaise, weight loss.   Eyes: Denies visual change or blurring, eye pain.   Ears/Nose/Throat: Denies earache, decreased  hearing, difficulty swallowing.   Cardiovascular: Denies chest pain or pressure, palpitations, shortness of breath.   Respiratory: Denies dry cough, productive cough, shortness of breath, wheezing.   Gastrointestinal: Denies acid indigestion, nausea, vomiting, diarrhea, abdominal pain, change in bowel habits, constipation, mucous or blood in stools.   Musculoskeletal: Denies muscle cramps or aches, muscle weakness, morning stiffness, joint pain, joint swelling.   Skin: Denies dry skin, rash, skin ulcers, suspicious lesions.   Psychiatric: Denies anxiety, depression, insomnia.     Vital Signs     Patient: 80 Years Old Male  Height:  70.6 in.  Weight: 248 lbs      Wt Chg: -5 since 12/12/2019  BMI:  35.11        35.82 on 12/12/2019  BP:  126/64 right arm, large cuff, seated     142/79 on 12/12/2019   Temp:  97.3  F    temporal  Pulse:  72         Pulse Ox: 95 %  On Oxygen: No    Patient is not experiencing pain  Are you having trouble paying for your medications?  No    Medications and Allergies Reviewed    Signed: Johnathan Hausen [place]....April 21, 2020 3:12 PM          Pulse Ox   Result: 95    Venipuncture   Venipuncture Completed    Signed By: Johnathan Hausen [place], April 21, 2020 4:03 PM           Immunization History:  Historical Source: Historical information - from other provider   COVID-19: COVID-19 Pfizer 30 mcg  #1 Historical (03/20/2019)   COVID-19: COVID-19 Pfizer 30 mcg  #2 Historical (04/12/2019)   COVID-19: COVID-19 Pfizer 30 mcg  #3 Historical (10/27/2019)      Physical Exam    General:      well developed, well nourished, in no acute distress.  The patient's current weight is 248 lbs. and BMI is 35.11.    Head:      normocephalic and atraumatic.    Eyes:      PERRL/EOM intact, conjunctiva and sclera clear with out nystagmus.  sees [facility]   in past had cataract surgery in [place]   eye pressures are ok   Ears:      TM's intact and clear with normal canals with grossly normal hearing.    Nose:      no deformity, discharge, inflammation, or lesions.    Mouth:      post nasal drip.      no dental issues--false teeth  Neck:      no masses, thyromegaly, or abnormal cervical nodes.    Chest Wall:      no deformities or breast masses noted.    Lungs:      clear bilaterally to auscultation.    Heart:      regular rhythm.  regular rhythm.    Abdomen:       normal bowel sounds; no hepatosplenomegaly no ventral,umbilical hernias or masses noted.    Rectal:      Last Colonoscopy: Complete on 04/28/2013.  refuses fit card and colo  Genitalia:      epididymal cyst on left   Prostate:      your last Prostate Specific Antigen (PSA) was 0.55 NGM/ML on 07/21/2019.    Msk:      no deformity or scoliosis noted of thoracic or lumbar spine.    Pulses:  pulses normal in all 4 extremities.    Extremities:      no clubbing, cyanosis, edema, or deformity noted with normal full range of motion of all joints.    Neurologic:      no focal deficits, cranial nerves II-XII grossly intact with normal sensation, reflexes, coordination, muscle strength and tone.    Skin:      intact without lesions or rashes.    Cervical Nodes:      no significant adenopathy.    Axillary Nodes:      no significant adenopathy.     Inguinal Nodes:      no significant adenopathy.    Psych:      alert and cooperative; normal mood and affect; normal attention span and concentration.           Assessment and Plan:      ~ABNORMAL EXAM OF TESTICLE    Order(s): US - Scrotal  HCG and alpha fetal protein    no pain today   on exam looks like epidydimal cyst     ~COPD: ACUTE EXACERBATION    let him have prednisone in the home   for COPD exacerbations     ~HYPOTHYROIDISM    Medication(s): levothyroxine 112 mcg tablet: Take 1 tablet by mouth once a day        No over the counter medications.   Med Compliance and SE's: Pt is compliant with meds with no side effects     Medications Removed Today:   cefpodoxime 100 mg tablet (cefpodoxime) 100 mg by mouth twice a day   prednisone 10 mg tablet (prednisone) 4 tabs daily for 3 days, 3 tabs daily for 3 days, 2 tabs daily for 3 days and 1 tab daily for 3 days  prednisone 10 mg tablet (prednisone) 4 tabs daily x 3 days, then 3 tabs daily x 3 days, then 2 tabs daily x 3 days, then 1 tab daily x 3 days  prednisone 10 mg tablet (prednisone) 4 tabs daily x 3 days, then 3 tabs daily x 3 days, then 2 tabs daily x 3 days, then 1 tab daily x 3 days  budesonide 0.5 mg/2 mL suspension for nebulization (budesonide) Use twice a day   cefpodoxime 100 mg tablet (cefpodoxime) 100 mg by mouth twice a day   doxycycline hyclate 100 mg tablet (doxycycline hyclate) Take 1 capsule by mouth twice a day   prednisone 10 mg tablet (prednisone) 4 tabs daily x 3 days, then 3 tabs daily x 3 days, then 2 tabs daily x 3 days, then 1 tab daily x 3 days  azithromycin 250 mg tablet (azithromycin) 500 mg by mouth on day 1, then 250 mg once daily for 4 days.  prednisone 10 mg tablet (prednisone) 4 tabs daily x 3 days, then 3 tabs daily x 3 days, then 2 tabs daily x 3 days, then 1 tab daily x 3 days  budesonide 0.5 mg/2 mL suspension for nebulization (budesonide) Use twice a day     Changes to Medication List Documented Today:   prednisone 10 mg  tablet (prednisone) 4 tablet by mouth as directed 4 tabs daily for 3 days, 3 tabs daily for 3 days, 2 tabs daily for 3 days and 1 tab daily for 3 days      Medications:  prednisone 10 mg tablet (prednisone) 4 tablet by mouth as directed 4 tabs daily for 3 days, 3 tabs daily for 3 days, 2 tabs daily for 3 days  and 1 tab daily for 3 days  #30 tablet x 1   Entered and Authorized by: Justin Mussel MD   Signed by: Justin Mussel MD on 04/21/2020   Method used: Electronically to        [phone] [address] [place], [place] [address], Ph: [phone] Fax: [phone]     RxID: [id number]          ]      Created By Johnathan Hausen [place] on 04/21/2020 at 03:10 PM    Electronically Signed By Justin Mussel MD on 04/21/2020 at 06:07 PM

## 2020-04-22 ENCOUNTER — Ambulatory Visit

## 2020-04-22 ENCOUNTER — Ambulatory Visit: Admitting: Internal Medicine

## 2020-04-22 LAB — HX ROUT.URINE WITH MICROSCOPIC
HX URINE BILIRUBIN: NEGATIVE
HX URINE BLOOD: NEGATIVE
HX URINE ESTERASE: 250 WBC/uL — AB
HX URINE GLUCOSE: NEGATIVE
HX URINE KETONES: NEGATIVE
HX URINE NITRITE: NEGATIVE
HX URINE PH: 5 (ref 5.0–8.0)
HX URINE PROTEIN: NEGATIVE
HX URINE RBC: <1 (ref 0.0–2.0)
HX URINE SPECIFIC GRAVITY: 1.017 (ref 1.003–1.03)
HX URINE SQUAMOUS EPITHELIAL: 1 /HPF (ref 0.0–5.0)
HX URINE WBC: 2 /HPF (ref 0.0–5.0)
HX UROBILINOGEN, URINE: NEGATIVE

## 2020-04-22 LAB — HX CBC W/DIFF
HX ABSOLUTE BASO COUNT AUTODIFF: 0.03 10*3/uL (ref 0.0–0.22)
HX ABSOLUTE EOS COUNT AUTODIFF: 0.48 10*3/uL — ABNORMAL HIGH (ref 0.0–0.45)
HX ABSOLUTE LYMPHS COUNT AUTODIFF: 1.37 10*3/uL (ref 0.74–5.04)
HX ABSOLUTE MONO COUNT AUTODIFF: 0.59 10*3/uL (ref 0.0–1.34)
HX ABSOLUTE NEUTRO COUNT AUTODIFF: 2.54 10*3/uL (ref 1.48–7.95)
HX BASOPHIL AUTOMATED: 0.6 %
HX EOSINOPHIL AUTOMATED: 9.5 %
HX HEMATOCRIT: 43.9 % (ref 39.0–53.0)
HX HEMOGLOBIN: 13.8 g/dL (ref 13.0–17.5)
HX IG AUTOMATED: 0.4 % (ref 0.0–2.0)
HX LYMPHOCYTE AUTOMATED: 27.2 %
HX MEAN CORP.HEMO.CONC.: 31.4 g/dL (ref 31.0–37.0)
HX MEAN CORPUSCULAR HEMOGLOBIN: 27.9 pg (ref 26.0–34.0)
HX MEAN CORPUSCULAR VOLUME: 88.9 fL (ref 80.0–100.0)
HX MEAN PLATELET VOLUME: 11.5 fL (ref 9.4–12.4)
HX MONOCYTE AUTOMATED: 11.7 %
HX NEUTROPHIL AUTOMATED: 50.6 %
HX NUCLEATED RBC %: 0 % (ref 0.0–0.0)
HX PLATELET COUNT: 194 10*3/uL (ref 150.0–400.0)
HX RED BLOOD COUNT: 4.94 10*6/uL (ref 4.2–5.9)
HX RED CELL DISTRIBUTION WIDTH SD: 46.1 fL (ref 35.0–51.0)
HX WHITE BLOOD COUNT: 5 10*3/uL (ref 4.0–11.0)

## 2020-04-22 LAB — HX COMPREHENSIVE METABOLIC PANEL
HX ALBUMIN: 3.8 g/dL (ref 3.2–5.0)
HX ALKALINE PHOSPHATASE: 77 U/L (ref 30.0–117.0)
HX ALT: 27 U/L (ref 6.0–55.0)
HX ANION GAP: 4 (ref 3.0–11.0)
HX AST: 25 U/L (ref 6.0–40.0)
HX BICARBONATE: 29 mmol/L (ref 21.0–32.0)
HX BUN: 14 mg/dL (ref 8.0–23.0)
HX CALCIUM: 9.1 mg/dL (ref 8.5–10.5)
HX CHLORIDE: 104 mmol/L (ref 98.0–110.0)
HX CREATININE: 1.19 mg/dL (ref 0.55–1.3)
HX GLOMERULAR FR AFRICAN AMERICAN: 67
HX GLOMERULAR FR NON AFRICAN AMER: 58
HX GLUCOSE: 86 mg/dL (ref 70.0–110.0)
HX POTASSIUM: 4.2 mmol/L (ref 3.6–5.2)
HX SODIUM: 137 mmol/L (ref 136.0–146.0)
HX TOTAL BILIRUBIN: 0.6 mg/dL (ref 0.2–1.2)
HX TOTAL PROTEIN: 6.9 g/dL (ref 6.0–8.4)

## 2020-04-22 LAB — HX SERUM HCG BETA SUBUNIT - QUANT: HX SERUM HCG BETA SUBUNIT - QUANT: <1 (ref 0.0–1.0)

## 2020-04-22 LAB — HX ALPHA FETOPROTEIN TUMOR MARKER: HX ALPHA FETOPROTEIN TUMOR MARKER: 9.1 ng/mL — ABNORMAL HIGH (ref 2.5–8.1)

## 2020-04-22 LAB — HX TSH WITH REFLEX: HX TSH WITH REFLEX: 1.98 u[IU]/mL (ref 0.358–3.74)

## 2020-04-22 LAB — HX MICROALBUMIN (RANDOM URINE)
HX ALBUMIN/CREAT RATIO RANDOM UR: 5.2 mg/g (ref 0.0–30.0)
HX MICROALBUMIN (RANDOM URINE): 10.1 mg/L (ref <=30.0)
HX URINE CREATININE (RANDOM): 194 mg/dL (ref 15.0–392.0)

## 2020-04-22 LAB — HX DIRECT BILIRUBIN: HX DIRECT BILIRUBIN: 0.2 mg/dL (ref 0.0–0.3)

## 2020-04-22 NOTE — Progress Notes (Signed)
 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...................[name]  April 22, 2020 4:07 PM      IMPRESSION:      Normal-appearing testes. Bilateral small epididymal head cysts are      present.    ALPHA FETOPROTEIN TUMOR MARKER                         [H]  9.1 nGm/ml (R)              2.5-8.1    Spoke with patient. Leave Monday to [place]. York Spaniel he can find a Tax adviser out in [place]. ..................................................................Marland KitchenJohnathan Navarro [place]  April 22, 2020 4:58 PM      Created By [name] on 04/22/2020 at 03:55 PM    Electronically Signed By [name] on 04/22/2020 at 06:04 PM

## 2020-04-27 NOTE — Teleconsult (Signed)
Phone Note -       Initial call taken by: Clydie Braun ANP,  August 15, 2016 9:01 PM  Initial Details of Call:  normal blood work      Follow-up #1  Details: patient was left a message to call us back regarding his labs   Action: Left Message for Patient  By: Ardeen Fillers [place] ~ August 16, 2016 10:06 AM

## 2020-04-27 NOTE — Teleconsult (Signed)
Phone Note -     Prior Authorization    Initial call taken by: Caffie Damme, M.D.,  July 15, 2016 2:33 PM  Summary of Call: Drug Name: spiriva  [facility]  Past drugs used or other pertinent information: patient as copd/asthma not controlled on symbicort. has tried incruse without benefit.   Provider:  [place]      Follow-up #1  Details: submitted  By: Theodora Blow ~ July 17, 2016 12:21 PM    Details: APPROVED  pharmacy / patient notified  By: Theodora Blow ~ July 17, 2016 12:56 PM

## 2020-04-27 NOTE — Teleconsult (Signed)
Phone Note -     Pharmacy    Initial call taken by: Lorenda Hatchet,  August 03, 2017 3:15 PM  Actual Caller: [facility]  Initial Details of Call:  [facility] pharmacy needs rx for Lorazepam refaxed with providers information and signature, which was missing on original fax.  Thank you.        Medications:  LORAZEPAM 1 MG ORAL TABLET (LORAZEPAM) Take one po two times daily as needed for anxiety No cash fill  #180 x 1   Entered by: [name]   Authorized by: Lorenda Hatchet   Signed by: [name] on 08/03/2017   Method used: Print then Give to Patient   RxID: [id number]        Medications:  Rx of LORAZEPAM 1 MG ORAL TABLET (LORAZEPAM) Take one po two times daily as needed for anxiety No cash fill;  #180 x 1;  Signed;  Entered by: [name];  Authorized by: Lorenda Hatchet;  Method used: Print then Give to Patient

## 2020-04-27 NOTE — Teleconsult (Signed)
Phone Note -       Initial call taken by: [name],  Jun 14, 2017 9:06 AM  Initial Details of Call:  CXR unchanged from 2018-clear          Frontal and lateral views demonstrate no interval discrete pleural or      parenchymal abnormality.  The cardiomediastinal silhouette and      pulmonary vascularity remain stable. No significant bony abnormality      has developed.            Impression:  Compared to 07/14/2016, no significant interval change.      Follow-up #1  Details: Spoke with PT, and informed him that there has been no significant changes since his 2018 CXR scan.   Action: Phone call completed  By: Danie Chandler [place] ~ Jun 14, 2017 9:50 AM

## 2020-04-27 NOTE — Teleconsult (Signed)
Phone Note -     Outgoing Call    Initial call taken by: Caffie Damme, M.D.,  July 15, 2016 9:38 AM  Summary of Call: last glycohemoglobin was 5.9 on 07/14/2016. this is consistent with prediabetes  he will return for nutrition teaching and follow up to copd with lynn in July.           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

## 2020-04-27 NOTE — Teleconsult (Signed)
Phone Note -     Patient    Routine    Initial call taken by: Aniceto Boss ([facility]),  October 03, 2017 10:32 AM  Initial Details of Call:  Teena Irani. from [facility] calling for a prior authorization request for the Ipatropium Albuterol. Onalee Hua would like to speak with a clinician to answer some questions.   Best contact: [phone] Teena Irani.      Follow-up #1  Details: calling ins   this will be Medicare B v D PA  By: Theodora Blow ~ October 04, 2017 10:00 AM    Details: Medicare D denied, however Medicare B covers this with the following on face of script    DX: COPD      ICD10 CODE: J44.9    please send new script to pharmacy  By: Theodora Blow ~ October 04, 2017 3:42 PM      Medications:  IPRATROPIUM-ALBUTEROL 0.5-2.5 (3) MG/3ML INHALATION SOLUTION (IPRATROPIUM-ALBUTEROL) use in neb machine four times daily  COPD   Dx J44.9  #400 Vial[Milliliter] x 3   Entered and Authorized by: [name]   Signed by: [name] on 10/04/2017   Method used: Print then Give to Patient   RxID: [id number]  IPRATROPIUM-ALBUTEROL 0.5-2.5 (3) MG/3ML INHALATION SOLUTION (IPRATROPIUM-ALBUTEROL) use in neb machine four times daily  COPD   Dx J44.9  #400[Vial] x 3   Entered and Authorized by: [name]   Signed by: [name] on 10/04/2017   Method used: Electronically to      [id number]     [address]     [place], [place]  [address]     Ph: [phone] or [phone]     Fax: [phone]   RxID: [id number]  IPRATROPIUM-ALBUTEROL 0.5-2.5 (3) MG/3ML INHALATION SOLUTION (IPRATROPIUM-ALBUTEROL) use in neb machine four times daily  COPD   Dx J45.9  #400[Vial] x 3   Entered and Authorized by: [name]   Signed by: [name] on 10/04/2017   Method used: Electronically to      [id number]     [address]     [place], [place]  [address]     Ph: [phone] or [phone]     Fax: [phone]   RxID: [id number]        Medications:  Changed  medication from IPRATROPIUM-ALBUTEROL 0.5-2.5 (3) MG/3ML INHALATION SOLUTION (IPRATROPIUM-ALBUTEROL) use in neb machine four times daily  Asthma  Dx J45.909 to IPRATROPIUM-ALBUTEROL 0.5-2.5 (3) MG/3ML INHALATION SOLUTION (IPRATROPIUM-ALBUTEROL) use in neb machine four times daily  COPD   Dx J45.9 - Signed  Changed medication from IPRATROPIUM-ALBUTEROL 0.5-2.5 (3) MG/3ML INHALATION SOLUTION (IPRATROPIUM-ALBUTEROL) use in neb machine four times daily  COPD   Dx J45.9 to IPRATROPIUM-ALBUTEROL 0.5-2.5 (3) MG/3ML INHALATION SOLUTION (IPRATROPIUM-ALBUTEROL) use in neb machine four times daily  COPD   Dx J44.9 - Signed  Rx of IPRATROPIUM-ALBUTEROL 0.5-2.5 (3) MG/3ML INHALATION SOLUTION (IPRATROPIUM-ALBUTEROL) use in neb machine four times daily  COPD   Dx J45.9;  #400[Vial] x 3;  Signed;  Entered by: [name];  Authorized by: [name];  Method used: [phone] [address] [place], [place]  [address], Ph: [phone] or [phone], Fax: [phone]  Rx of IPRATROPIUM-ALBUTEROL 0.5-2.5 (3) MG/3ML INHALATION SOLUTION (IPRATROPIUM-ALBUTEROL) use in neb machine four times daily  COPD   Dx J44.9;  #400[Vial] x 3;  Signed;  Entered by: [name];  Authorized  by: [name];  Method used: [phone][facility] [address] [place], [place]  [address], Ph: [phone] or [phone], Fax: [phone]  Rx of IPRATROPIUM-ALBUTEROL 0.5-2.5 (3) MG/3ML INHALATION SOLUTION (IPRATROPIUM-ALBUTEROL) use in neb machine four times daily  COPD   Dx J44.9;  #400 Vial x 3;  Signed;  Entered by: [name];  Authorized by: [name];  Method used: Print then Give to Patient

## 2020-04-27 NOTE — Progress Notes (Signed)
[facility] - [address]   [address]   [place], [place] [address]  Office: [phone] Fax: [phone]  Active Medication List and Instructions  May 25, 2017     Justin Navarro  [birth year]    1)  ASPIRIN 81 MG ORAL TABLET DELAYED RELEASE (ASPIRIN) take one by mouth daily  2)  SYMBICORT 160-4.5 MCG/ACT INHALATION AEROSOL (BUDESONIDE-FORMOTEROL FUMARATE) two puffs twice a day for asthma rinse afterwards  3)  SYNTHROID 100 MCG ORAL TABLET (LEVOTHYROXINE SODIUM) Take one po once daily  NEW HIGHER DOSE; Route: ORAL  4)  PRAVASTATIN SODIUM 40 MG ORAL TABLET (PRAVASTATIN SODIUM) Take one po q hs Recheck chol in 2 months  5)  COMPAIR NEBULIZER (NEBULIZERS) Use with albuterol nebs four times daily  6)  IPRATROPIUM-ALBUTEROL 0.5-2.5 (3) MG/3ML INHALATION SOLUTION (IPRATROPIUM-ALBUTEROL) use in neb machine four times daily  Asthma  Dx J45.909  7)  CELEBREX 200 MG ORAL CAPSULE (CELECOXIB) Take one po daily DO NOT TAKE IF TAKING INDOCIN  8)  FLUTICASONE PROPIONATE 50 MCG/ACT NASAL SUSPENSION (FLUTICASONE PROPIONATE) 2 sprays each nostril daily for nasal symptoms  9)  VENTOLIN HFA 108 (90 Base) MCG/ACT INHALATION AEROSOL SOLUTION (ALBUTEROL SULFATE) Take two whiffs q 4 hrs as needed for wheezing; Route: INHALATION  10)  MIRTAZAPINE 15 MG ORAL TABLET (MIRTAZAPINE) take one po at hs as needed for insomnia  11)  LORAZEPAM 1 MG ORAL TABLET (LORAZEPAM) Take one po two times daily as needed for anxiety No cash fill  12)  PREDNISONE 20 MG ORAL TABLET (PREDNISONE) Take one po two times daily with food; Route: ORAL  13)  SPIRIVA HANDIHALER 18 MCG INHALATION CAPSULE (TIOTROPIUM BROMIDE MONOHYDRATE) 1 inhalation daily for copd; Route: INHALATION  14)  MEDROL 4 MG ORAL TABLET THERAPY PACK (METHYLPREDNISOLONE) Take as directed for gout; Route: ORAL    Allergies:  1)  PENICILLIN (Critical)  2)  * CT CONTRAST (Critical)  3)  * BELSOMRA (Mild)    Abbreviations:    bid= twice a day   Q= per  D= day    QD= per day  HS=  bed time   QID= 4 times a day  OTC= over the counter  QOD= every other day  PO= by mouth   TID= three times a day  PRN= as needed   TID= 3 times a day

## 2020-04-27 NOTE — Progress Notes (Signed)
[facility] - [address]   [address]   [place], [place] [address]  Office: [phone] Fax: [phone]     Justin Navarro (05-11-1940)       Printed: July 15, 2016    July 15, 2016      Sven Pinheiro  [address]  [place], [place]  [address]    Dear [name],    I have received the results of your most recent labwork. The results are listed below:     Labs Your Value Normal Result Date   Blood sugar 75 Normal : 70-100 07/14/2016   Hemoglobin A1C   (3 month sugar test) 5.9 Normal: 4.2  5.8 07/14/2016   Estimated Average Glucose  (3 month Average) 123 Goal: less than 150 07/14/2016   Creatinine (kidney function) 1.0 Normal: 0.4  1.2   07/14/2016   ALT (liver test) 25 Normal male:0-40   07/14/2016   AST (liver test) 26 Normal male: 0-37 07/14/2016   Hematocrit (blood count)   43.1 Normal male: 40-52 07/14/2016   TSH (ultra thyroid test) 2.77 Normal: 0.27  4.20 07/14/2016         Sincerely,  Caffie Damme, M.D.

## 2020-04-27 NOTE — Progress Notes (Signed)
[facility] - [address]   [address]   [place], [place] [address]  Office: [phone] Fax: [phone]  Active Medication List and Instructions  May 25, 2017     Justin Navarro  04/08/1940    1)  SYMBICORT 160-4.5 MCG/ACT INHALATION AEROSOL (BUDESONIDE-FORMOTEROL FUMARATE) two puffs twice a day for asthma rinse afterwards  2)  SYNTHROID 100 MCG ORAL TABLET (LEVOTHYROXINE SODIUM) Take one po once daily  NEW HIGHER DOSE; Route: ORAL  3)  PRAVASTATIN SODIUM 40 MG ORAL TABLET (PRAVASTATIN SODIUM) Take one po q hs Recheck chol in 2 months  4)  COMPAIR NEBULIZER (NEBULIZERS) Use with albuterol nebs four times daily  5)  IPRATROPIUM-ALBUTEROL 0.5-2.5 (3) MG/3ML INHALATION SOLUTION (IPRATROPIUM-ALBUTEROL) use in neb machine four times daily  Asthma  Dx J45.909  6)  CELEBREX 200 MG ORAL CAPSULE (CELECOXIB) Take one po daily DO NOT TAKE IF TAKING INDOCIN  7)  VENTOLIN HFA 108 (90 Base) MCG/ACT INHALATION AEROSOL SOLUTION (ALBUTEROL SULFATE) Take two whiffs q 4 hrs as needed for wheezing; Route: INHALATION  8)  MIRTAZAPINE 15 MG ORAL TABLET (MIRTAZAPINE) take one po at hs as needed for insomnia  9)  LORAZEPAM 1 MG ORAL TABLET (LORAZEPAM) Take one po two times daily as needed for anxiety No cash fill    Allergies:  1)  PENICILLIN (Critical)  2)  * CT CONTRAST (Critical)  3)  * BELSOMRA (Mild)    Abbreviations:    bid= twice a day   Q= per  D= day    QD= per day  HS= bed time   QID= 4 times a day  OTC= over the counter  QOD= every other day  PO= by mouth   TID= three times a day  PRN= as needed   TID= 3 times a day

## 2020-04-27 NOTE — Teleconsult (Signed)
Phone Note -     Patient    **Refill Request Only**    Call back at Ph1 [phone]  Initial call taken by: Joesph Fillers,  May 07, 2017 9:08 AM  Actual Caller: Patient  Call For: Nurse  Initial Details of Call:  The patient called in to request a refill for and was offered to schedule his annual.  The patient last medicare wellness was on 01/22/2015 and is currently schedule for a CPEX on 05/25/2017.  The patient insurance is active and verified        Medications:  LORAZEPAM 1 MG ORAL TABLET (LORAZEPAM) Take one po two times daily as needed for anxiety No cash fill  #180 x 1   Entered by: Joesph Fillers   Authorized by: [name]   Signed by: [name] on 05/07/2017   Method used: Printed then faxed to ...     [facility] [phone] (retail)     [name]     [name], [place]  [address]     Ph: [phone] or [phone]     Fax: [phone]   RxID: [id number]    Previous Appointment:  08/14/2016  Clydie Braun ANP      07/14/2016  Caffie Damme, M.D.      01/20/2016  [name]      07/21/2015  Ardeen Fillers [place]      01/22/2015  Ardeen Fillers [place]      01/22/2015  Ardeen Fillers [place]      07/01/2014  Ardeen Fillers [place]      01/01/2014  [name]      11/27/2013  Ardeen Fillers [place]          Next Appointment: 05/25/2017 09:20 AM Ceylon Arenson MD, Jonny Ruiz      Last BP:  110/60   (08/14/2016)    Last HGBA1C:  5.9    (07/14/2016)    BMP/CMP  Last BUN:     15    (08/14/2016),                Last EGFR:     65    (08/14/2016)  Last Creatinine:     1.1    (08/14/2016),      Last Sodium:   140    (08/14/2016)       Last Potassium:     4.3    (08/14/2016)    Last  Cholesterol:     165    (08/14/2016)     Last  LDL:     86    (08/14/2016),    Last  LDL Direct:     ()    Last  SGPT ALT:     22    (08/14/2016),  Last Albumin:     4.1    (08/14/2016)    CBC  Last WBC:     6.1    (07/14/2016)  Last Hgb:     14.2    (07/14/2016)  Last Platelet Count:     178 K/UL    (07/14/2016)    Last Digoxin Level:         ()    Last TSH:       (),  Last TSH Ultra:   2.77    (07/14/2016)      Last Urine Drug Screen: on .  Narcotic Medications:  LORAZEPAM 1 MG ORAL TABLET (LORAZEPAM)  - Started: 05/23/2005    LORAZEPAM 1 MG ORAL TABLET (LORAZEPAM)  -  Started: 03/15/2006    LORAZEPAM 1 MG ORAL TABLET (LORAZEPAM)  - Started: 09/04/2011    LORAZEPAM 1 MG ORAL TABLET (LORAZEPAM)  - Started: 03/11/2012    LORAZEPAM 1 MG ORAL TABLET (LORAZEPAM)  - Started: 08/19/2012    LORAZEPAM 1 MG ORAL TABLET (LORAZEPAM)  - Started: 08/19/2012    LORAZEPAM 1 MG ORAL TABLET (LORAZEPAM)  - Started: 08/19/2012        Directives/Controlled Substance Agreement  HEALTH CARE PROXY   DISCUSED WITH PATIENT -- FULL CODE    PMP Appropriate __YES      ___No      Medications:  Rx of LORAZEPAM 1 MG ORAL TABLET (LORAZEPAM) Take one po two times daily as needed for anxiety No cash fill;  #180 x 1;  Signed;  Entered by: Joesph Fillers;  Authorized by: [name];  Method used: Printed then [facility]ed to [phone]*, [address] [place], [place]  [address], Ph: [phone] or [phone], Fax: [phone]

## 2020-05-19 ENCOUNTER — Telehealth (INDEPENDENT_AMBULATORY_CARE_PROVIDER_SITE_OTHER): Admitting: Internal Medicine

## 2020-05-19 NOTE — Telephone Encounter (Signed)
Patient stated he is trying to get records released to new doctor but he is unable to get an appointment with new doctor for records release. Advised of records release options  Best call back [phone]  Dr Lisette Grinder  [address] in [place], [place]  Fax [phone]

## 2020-07-16 ENCOUNTER — Other Ambulatory Visit (INDEPENDENT_AMBULATORY_CARE_PROVIDER_SITE_OTHER): Admitting: Internal Medicine

## 2020-08-02 ENCOUNTER — Other Ambulatory Visit (INDEPENDENT_AMBULATORY_CARE_PROVIDER_SITE_OTHER): Admitting: Internal Medicine

## 2020-08-02 NOTE — Telephone Encounter (Signed)
 Last OV: 04/21/20

## 2020-08-03 NOTE — Telephone Encounter (Signed)
 Patient called to request a refill of  lorazepam 1mg         Pharmacy: [id number] - [place], [place] - [address]    Informed patient it can take 3 business days to send prescription refill to pharmacy.     Unable to verify insurance for: Gerome Kokesh , [birth year]   Error in Epic when running RTE: Patient Not Found, checked with patient as well  Insurance ID # [id number]  Insurance name: [facility] cross [employer] number on back of insurance card:    Address on insurance card to send medical claims to:     Call back number: [phone]

## 2020-08-04 MED ORDER — LORazepam (Ativan) 1 mg tablet
1 | ORAL_TABLET | Freq: Two times a day (BID) | ORAL | 1 refills | 15.00000 days | Status: AC | PRN
Start: 2020-08-04 — End: ?

## 2020-08-09 ENCOUNTER — Telehealth (INDEPENDENT_AMBULATORY_CARE_PROVIDER_SITE_OTHER): Admitting: Internal Medicine

## 2020-08-09 NOTE — Telephone Encounter (Signed)
 Patient states he usually gets a 90 day supply of LORazepam (Ativan) 1 mg tablet  and only got a 60 day.    Patient states he picked up prescription 3 days ago and just notice its only for 60 days with 1 refill.    Patient inquiring if there is a reason why its  only a 60 day supply?    Please advise     Can reached at [phone]

## 2020-09-03 ENCOUNTER — Other Ambulatory Visit (INDEPENDENT_AMBULATORY_CARE_PROVIDER_SITE_OTHER): Admitting: Internal Medicine

## 2020-09-03 NOTE — Telephone Encounter (Signed)
 Appointment:        Date of patient's next encounter in the current department:  Visit date not found   Date of patient's next encounter with the current provider:  Visit date not found   Date of patient's last encounter in the current department: Visit date not found      Last BP:   BP Readings from Last 3 Encounters:   04/21/20 126/64   01/05/20 (!) 115/91   12/12/19 (!) 142/79       Last HGBA1C: No results found for: HGBA1C    Labs  No results found for: TSH  No results found for: GLU, CALCIUM, ALBUMIN, NA, K, CO2, CL, BUN, CREATININE, LABBONE, LABBILI   No results found for: AST  No results found for: ALT  No components found for: TBILI  No results found for: HGB  No results found for: HCT  No results found for: PLT  No results found for: PTT  No results found for: INR  No results found for: PROTIME  No results found for: PTADJUSTED    Last Urine Drug Screen: on .  Narcotic Medications:    Directives/Controlled Substance Agreement   PMP Appropriate __YES      ___No

## 2020-09-26 ENCOUNTER — Other Ambulatory Visit (HOSPITAL_BASED_OUTPATIENT_CLINIC_OR_DEPARTMENT_OTHER): Admitting: Pulmonary Disease

## 2020-09-27 NOTE — Telephone Encounter (Signed)
 Wrong provider

## 2020-09-28 ENCOUNTER — Other Ambulatory Visit (INDEPENDENT_AMBULATORY_CARE_PROVIDER_SITE_OTHER): Admitting: Internal Medicine

## 2020-09-28 MED ORDER — levothyroxine (Synthroid, Levoxyl) 112 mcg tablet
112 | ORAL_TABLET | Freq: Every day | ORAL | 3 refills | 90.00000 days | Status: AC
Start: 2020-09-28 — End: 2021-09-28

## 2020-10-19 IMAGING — CT CT CHEST WITHOUT CONTRAST
2 of 4 series · 14 of 36 positions shown, 17 images · non-contrast
Comparison: Chest CT March 10, 2020

________________________________________________________________________________________________ 
CT CHEST WITHOUT CONTRAST, 10/19/2020 [DATE]: 
CLINICAL INDICATION:  Follow-up pulmonary nodules, shortness of breath 
A search for DICOM formatted images was conducted for prior CT imaging studies 
completed at a non-affiliated media free facility.   
, Chest CT July 01, 2019
TECHNIQUE: The chest was scanned from base of neck through the lung bases 
without contrast on a high resolution low dose CT scanner. Routine MPR and MIP 
3D renderings were reconstructed on an independent workstation with concurrent 
physician supervision.

[Series 2: chest w/o 2.0 i31s 3 · axial · non-contrast · 0.88mm/px · z∈[+918,+1202]mm · 11 of 168 slices shown, 14 images]
[im 13/168  mediastinal]
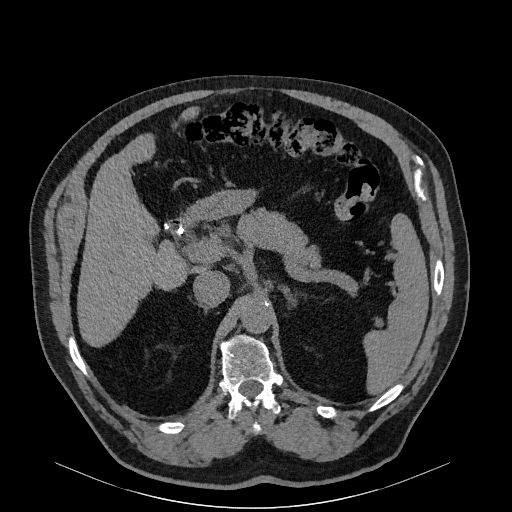
[im 13/168  lung]
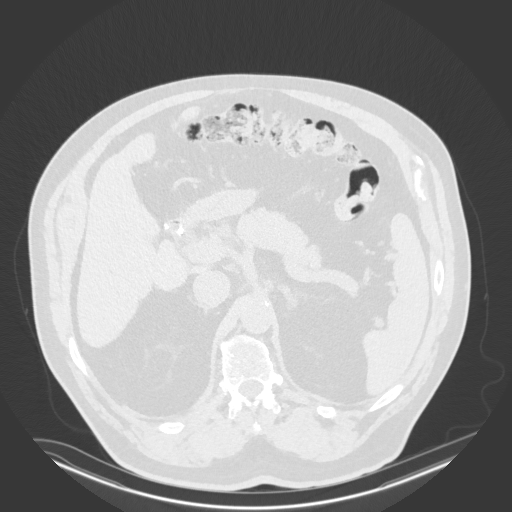
[im 26/168  lung]
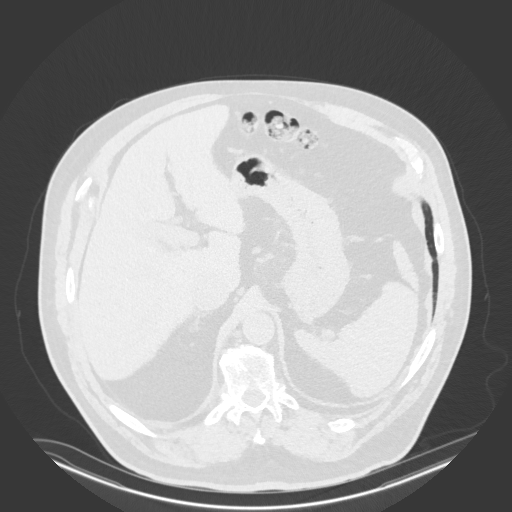
[im 39/168  lung]
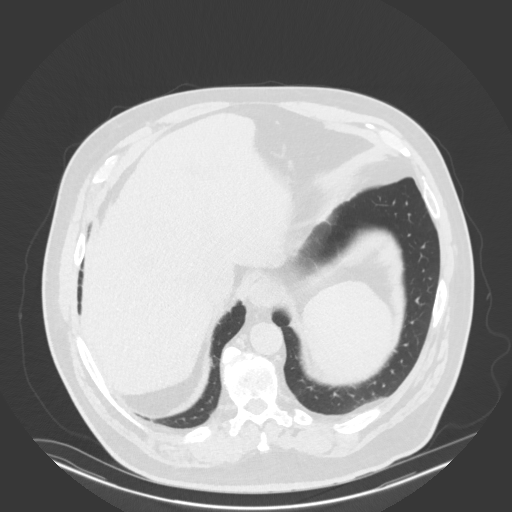
[im 52/168  lung]
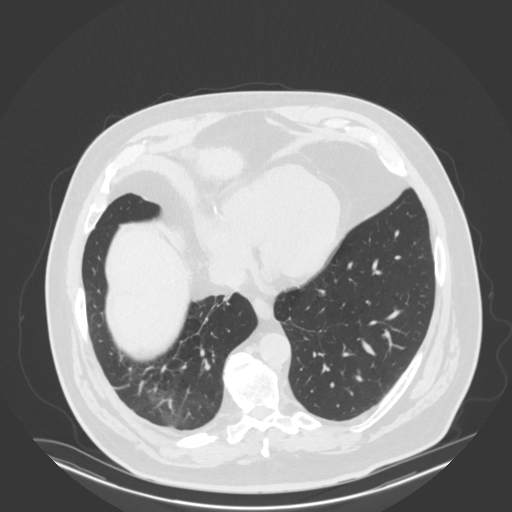
[im 65/168  mediastinal]
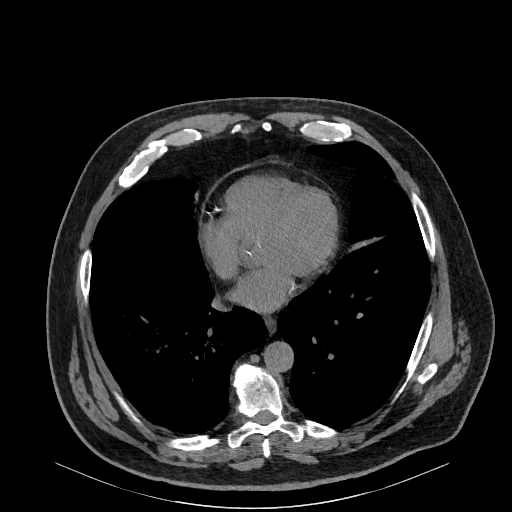
[im 65/168  lung]
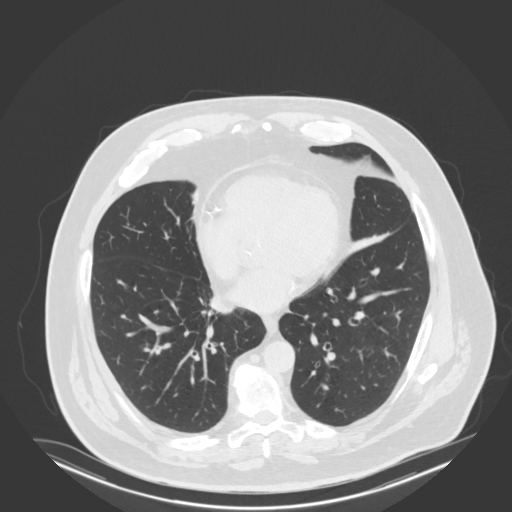
[im 90/168  lung]
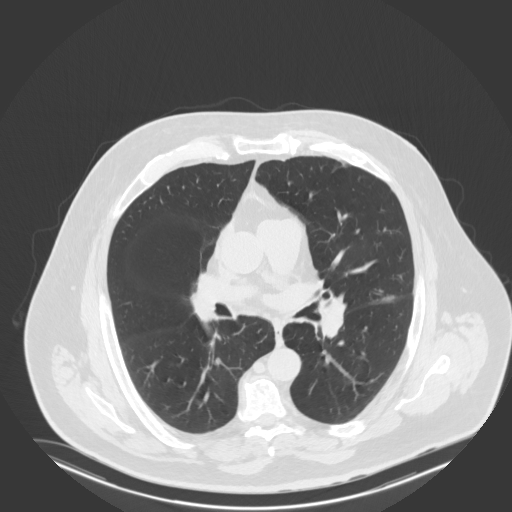
[im 103/168  lung]
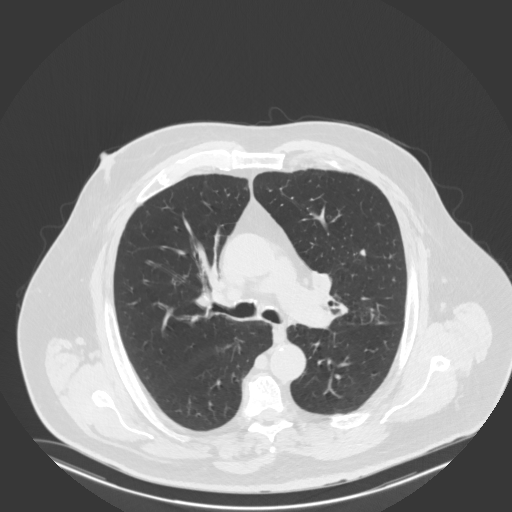
[im 116/168  lung]
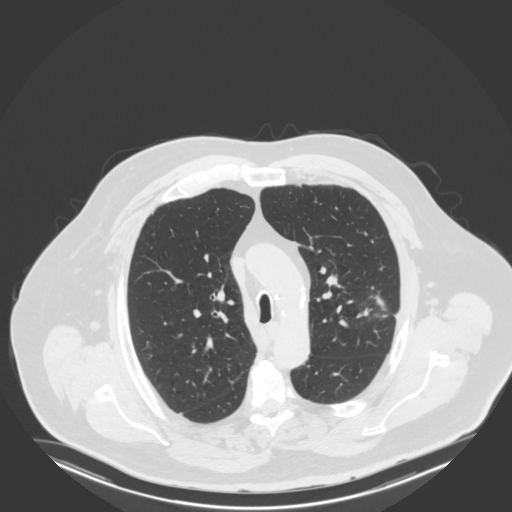
[im 129/168  mediastinal]
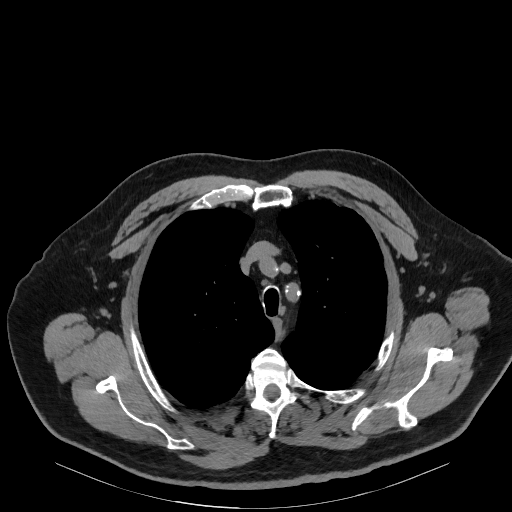
[im 129/168  lung]
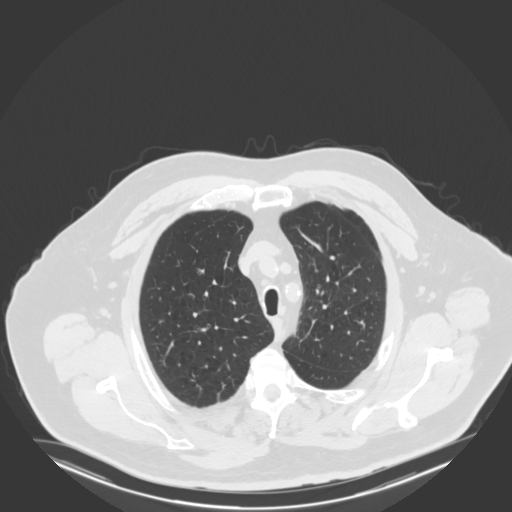
[im 142/168  lung]
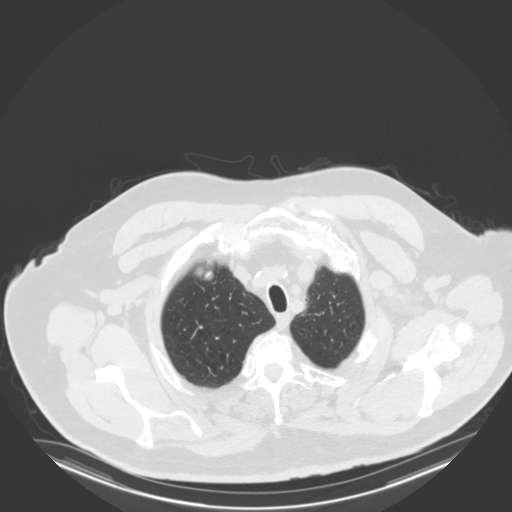
[im 155/168  lung]
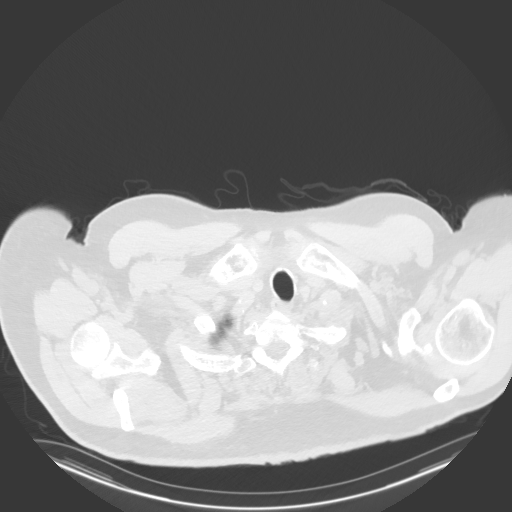

[Series 4: coronal · coronal · 0.67mm/px · 3 of 190 slices shown]
[im 38/190  lung]
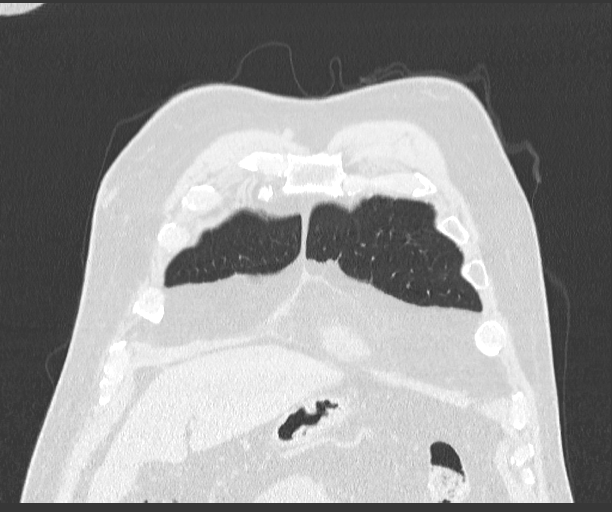
[im 76/190  lung]
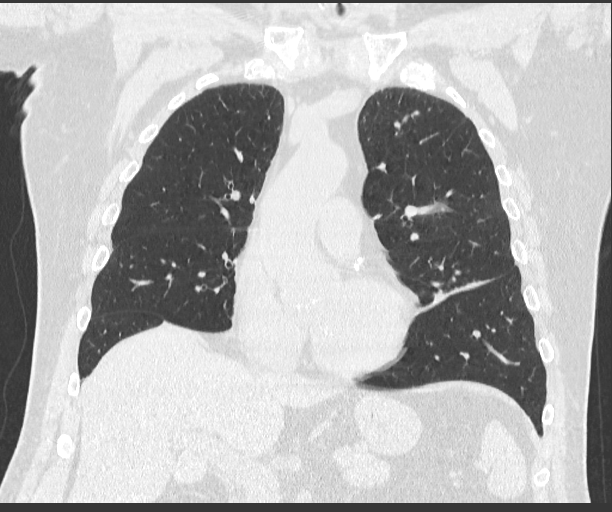
[im 114/190  lung]
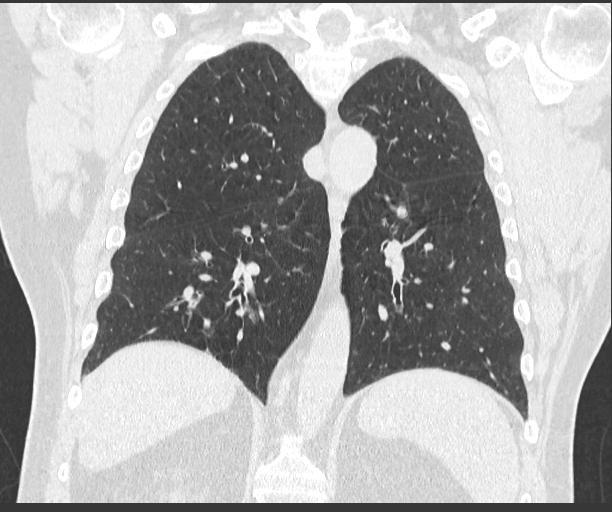

[14 of 36 positions shown; findings below may reference images not displayed]

FINDINGS: Spiculated lesion in the left upper lobe is difficult to accurately 
measure, 2.1 cm on axial image 56, 2.3 cm on image 59, grossly similar to the 
prior study. There is a small tail extending toward the pleura, also present 
previously. In the right lower lobe, image 113 there is a 9 mm nodule, 
previously 9 mm. The previously seen smaller nodules in the posterior right 
lower lobe appear resolved. There are mild to moderate emphysematous changes. 
There is bronchial wall thickening without bronchiectasis. There is narrowing of 
the trachea, best appreciated axial image 48, suggesting tracheomalacia. This is 
not different from the prior study. There is linear atelectasis at the right 
lung base and lingula. There is no mediastinal or axillary adenopathy. The heart 
is not enlarged. There are extensive coronary artery calcifications. There is no 
adrenal nodule. Surgical clips at the porta hepatis. There is mild aortic 
plaque. There are degenerative changes.
IMPRESSION: The spiculated 2.3 cm left upper lobe nodule is not different from the March 2020 chest CT.. Further surveillance follow-up. Per prior report there is 
history of previous biopsy. On the July 01, 2019 chest CT this nodule measured 
approximately 1.4 cm, not including the pleural tail, showing interval 
enlargement over 15 months. Clinical correlation necessary. If clinically 
indicated PET/CT would be useful. 
Stable 9 mm right lower lobe nodule. This nodule measured 15 mm on the July 01, 2019 study. Several smaller right lower lobe nodule seen on the March 2020 
study have resolved. 
Mild to moderate emphysematous change. Mild bronchial wall thickening. Linear 
atelectasis at the lung bases and lingula. 
Moderate coronary artery calcifications. 
Narrowing of the trachea is unchanged from the prior study. 
RADIATION DOSE REDUCTION: All CT scans are performed using radiation dose 
reduction techniques, when applicable.  Technical factors are evaluated and 
adjusted to ensure appropriate moderation of exposure.  Automated dose 
management technology is applied to adjust the radiation doses to minimize 
exposure while achieving diagnostic quality images.

## 2020-12-08 IMAGING — PT PET CT SCAN TUMOR IMAGING SKULL TO THIGH
6 series · 25 of 25 positions shown · non-contrast
Comparison: CT chest October 19, 2020. PET/CT October 07, 2019.

________________________________________________________________________________________________ 
PET CT SCAN TUMOR IMAGING SKULL TO THIGH, 12/08/2020 [DATE]: 
CLINICAL INDICATION: Further evaluation of a suspicious spiculated LEFT upper 
lung mass.
TECHNIQUE: A dose of 13.6 millicuries of 18-FDG was administered intravenously 
and skull to thigh PET scanning was performed at 80 minutes. Tomographic scans 
were reconstructed in axial, coronal, and sagittal projections. The data was 
reconstructed into a three-dimensional volume rendered image and reviewed in a 
rotational cine loop. Serum blood glucose at the time of injection was 126 
mg/dl.

[Series 201: body-low dose ct, idose (4) · axial · 4.0mm · 1.17mm/px · z∈[+156,+1172]mm · 6 of 255 slices shown]
[im 1/255]
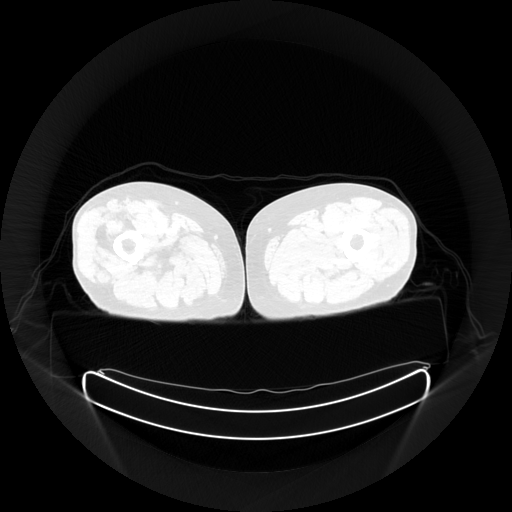
[im 51/255]
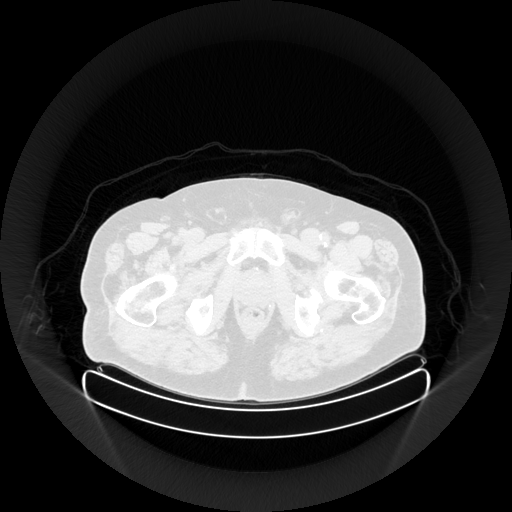
[im 102/255]
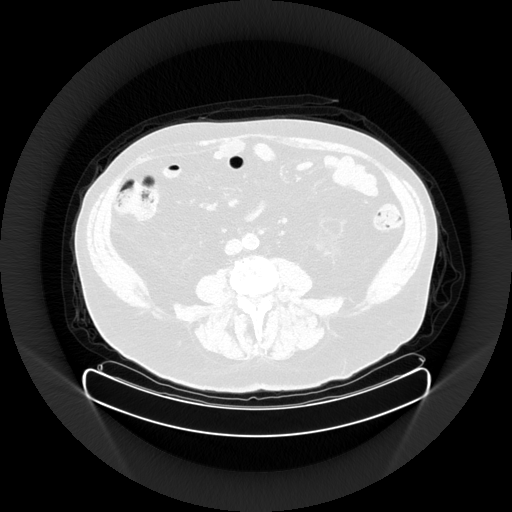
[im 153/255]
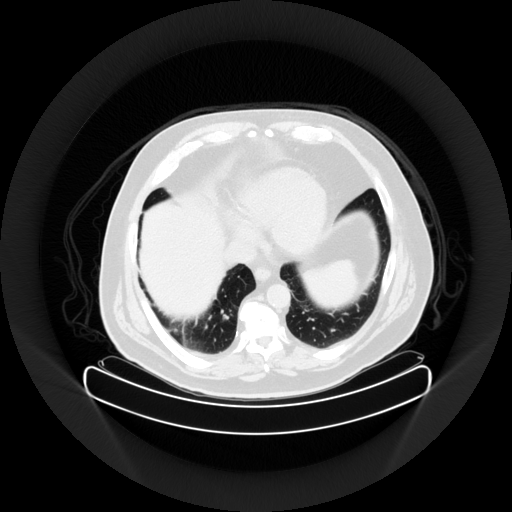
[im 204/255]
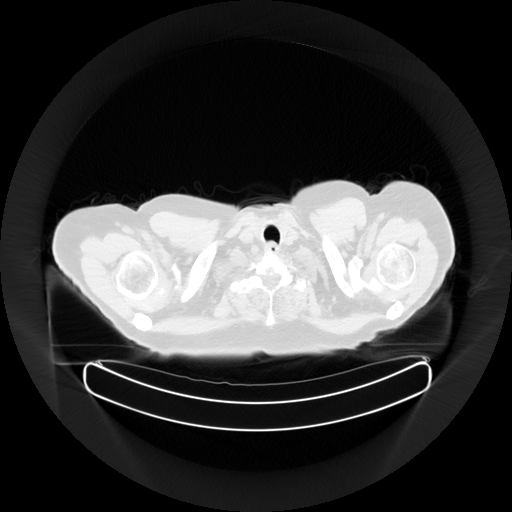
[im 255/255]
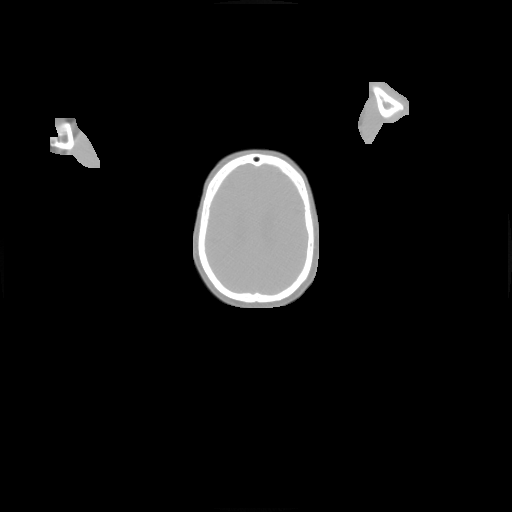

[Series 202: lung ct, idose (2) · axial · 2.0mm · 1.17mm/px · z∈[+666,+1012]mm · 3 of 174 slices shown]
[im 1/174]
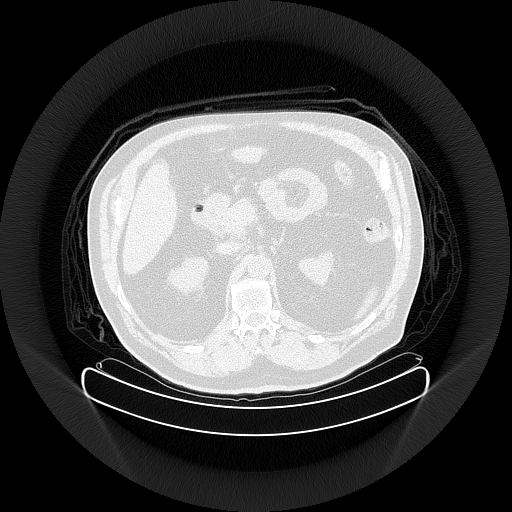
[im 87/174]
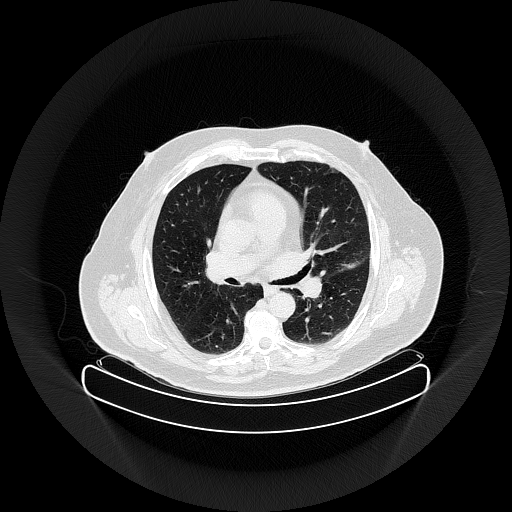
[im 174/174]
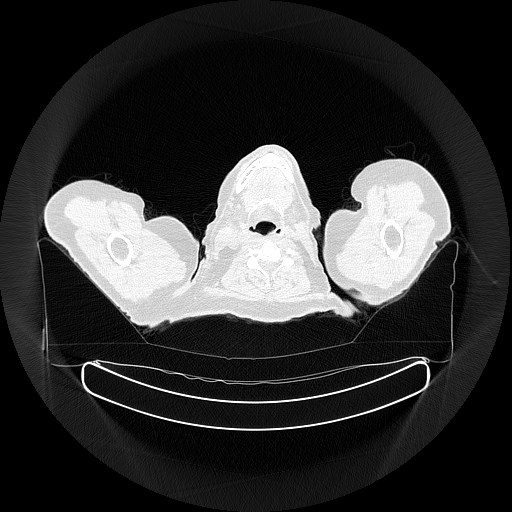

[Series 2306: (wb_nac) body · axial · 4.0mm · 4.00mm/px · z∈[+159,+1171]mm · 5 of 254 slices shown]
[im 1/254]
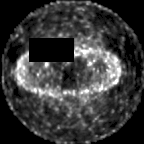
[im 64/254]
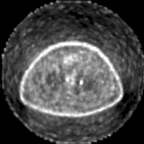
[im 127/254]
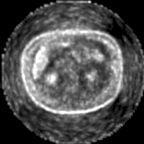
[im 190/254]
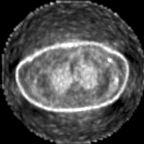
[im 254/254]
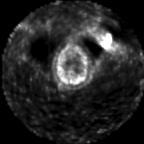

[Series 2308: (wb_ctac) body · axial · 4.0mm · 4.00mm/px · z∈[+159,+1171]mm · 5 of 254 slices shown]
[im 1/254]
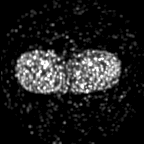
[im 64/254]
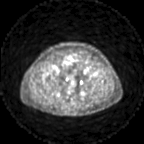
[im 127/254]
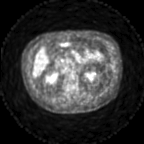
[im 190/254]
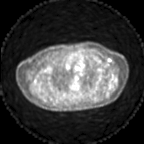
[im 254/254]
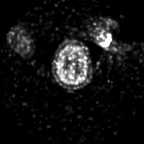

[Series 2310: (detailwb_nac_psf) body · axial · 2.0mm · 2.00mm/px · z∈[+666,+1012]mm · 3 of 174 slices shown]
[im 1/174  full-range]
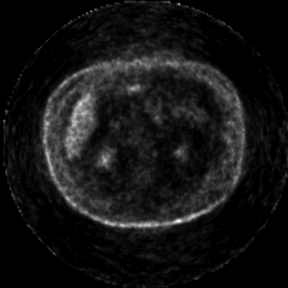
[im 87/174  full-range]
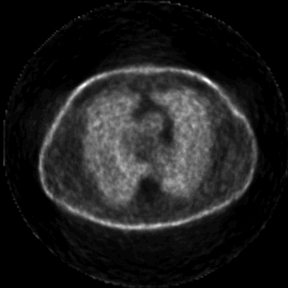
[im 174/174]
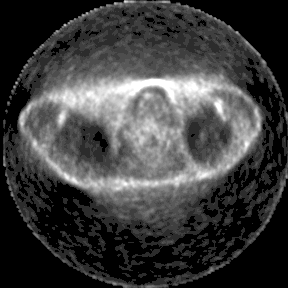

[Series 2311: (detailwb_ctac_psf) body · axial · 2.0mm · 2.00mm/px · z∈[+666,+1012]mm · 3 of 174 slices shown]
[im 1/174]
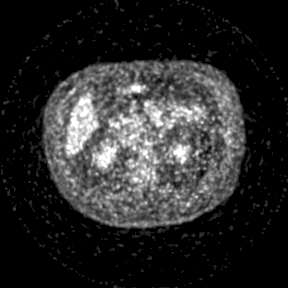
[im 87/174]
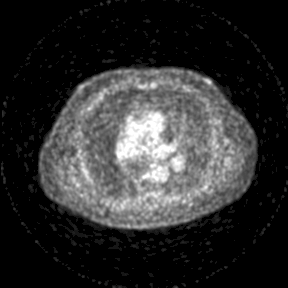
[im 174/174]
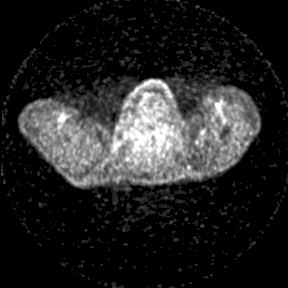

[25 of 25 positions shown; findings below may reference images not displayed]

FINDINGS: The spiculated LEFT upper lobe mass is again noted extending towards the LEFT 
hilum. This shows high level FDG activity with a max SUV of 16.0. No other focal 
pulmonary FDG activity is found. There is no evidence for FDG avid mediastinal 
or hilar lymphadenopathy. A focus of activity in the LEFT axilla does not 
correspond to a concerning abnormality on the low-dose CT and is felt to be 
related to radiotracer hanging up in a vein. Patient was injected in the LEFT 
arm.  
Regarding the 9 mm RIGHT lower lobe nodule, it shows minimal FDG activity with a 
max SUV of 1.6. The PET CT is otherwise unremarkable. 
The low-dose CT shows severe coronary artery calcifications. The gallbladder is 
surgically absent. A LEFT renal cyst is incidentally noted. Colonic 
diverticulosis is incidentally noted.
IMPRESSION: 1. There is high level activity associated with the spiculated mass in the LEFT 
upper lobe suggesting a primary pulmonary malignancy. There is no evidence for 
metabolically active hilar or mediastinal activity and no evidence for distant 
metastasis. As compared to the previous PET/CT from October 06, 2020 the tumor 
is larger and shows an increase in max SUV from 9.5 on the prior PET to 16.0 on 
todays exam. 
2. Regarding the 9 millimeter RIGHT lower lobe nodule it shows low-level FDG 
activity suggesting that it is likely benign.

## 2021-04-14 ENCOUNTER — Other Ambulatory Visit (INDEPENDENT_AMBULATORY_CARE_PROVIDER_SITE_OTHER): Admitting: Internal Medicine

## 2021-04-14 NOTE — Telephone Encounter (Signed)
04/21/20- last o.v here   NOT BEEN OVER  YEAR YET       Letter will be mailed for pe appt

## 2021-05-20 NOTE — Telephone Encounter (Signed)
04/21/20 last o.v    Letter will be mailed for appt reminder   No refills q'ued  Please send 30 days

## 2021-07-19 NOTE — Telephone Encounter (Signed)
04/22/20- last o.v    Letter mailed to him on 05/20/21 to book his appt     No refills q'ued    He never booked his next appt  Delete this request if you do not want to send

## 2021-09-21 NOTE — Telephone Encounter (Signed)
WAS SUPPOSE TO FOLLOW UP W DR.CLARK [facility] 09/07/19 NO SHOW    THEY STARTED HIM ON PROTONIX      Last visit 04/22/20  Appointment:        Date of patient's next encounter in the current department:  09/21/2021   Date of patient's next encounter with the current provider:  Visit date not found   Date of patient's last encounter in the current department: Visit date not found   Date of patient's last annual exam    Not on File     Last BP:   BP Readings from Last 3 Encounters:   04/21/20 126/64   01/05/20 (!) 115/91   12/12/19 (!) 142/79       Last HGBA1C: No results found for: HGBA1C    Labs  No results found for: TSH  No results found for: GLU, CALCIUM, ALBUMIN, NA, K, CO2, CL, BUN, CREATININE, LABBONE, LABBILI   No results found for: AST  No results found for: ALT  No components found for: TBILI  No results found for: HGB  No results found for: HCT  No results found for: PLT  No results found for: PTT  No results found for: INR  No results found for: PROTIME  No results found for: PTADJUSTED    Last Urine Drug Screen: on .  Narcotic Medications:    Directives/Controlled Substance Agreement   PMP Appropriate __YES      ___No

## 2021-09-21 NOTE — Telephone Encounter (Signed)
Last visit 04/22/20  Appointment:        Date of patient's next encounter in the current department:  Visit date not found   Date of patient's next encounter with the current provider:  Visit date not found   Date of patient's last encounter in the current department: Visit date not found   Date of patient's last annual exam    Not on File     Last BP:   BP Readings from Last 3 Encounters:   04/21/20 126/64   01/05/20 (!) 115/91   12/12/19 (!) 142/79       Last HGBA1C: No results found for: HGBA1C    Labs  No results found for: TSH  No results found for: GLU, CALCIUM, ALBUMIN, NA, K, CO2, CL, BUN, CREATININE, LABBONE, LABBILI   No results found for: AST  No results found for: ALT  No components found for: TBILI  No results found for: HGB  No results found for: HCT  No results found for: PLT  No results found for: PTT  No results found for: INR  No results found for: PROTIME  No results found for: PTADJUSTED    Last Urine Drug Screen: on .  Narcotic Medications:    Directives/Controlled Substance Agreement   PMP Appropriate __YES      ___No

## 2021-10-21 NOTE — Telephone Encounter (Signed)
Former patient of Dr. Dagoberto Reef would like to know if we can help with some information regarding the medication ipratroprum bromide.    Patient left our practice 5 years ago and his new pcp does not know hwo to get this medication.    Call back: [phone]

## 2021-10-24 IMAGING — CT CT CHEST WITHOUT CONTRAST
2 of 4 series · 14 of 36 positions shown, 17 images · non-contrast
Comparison: PET scan from December 08, 2020 and CT scan from September 2020

________________________________________________________________________________________________ 
CT CHEST WITHOUT CONTRAST, 10/24/2021 [DATE]: 
CLINICAL INDICATION: Chronic cough. History of oral carcinoma. 
A search for DICOM formatted images was conducted for prior CT imaging studies 
completed at a non-affiliated media free facility.
TECHNIQUE: The chest was scanned from base of neck through the lung bases 
without contrast on a high resolution low dose CT scanner. Routine MPR and MIP 
3D renderings were performed with concurrent physician supervision.

[Series 2: chest 2.0 i31s 3 · axial · 0.85mm/px · z∈[-319,-21]mm · 11 of 165 slices shown, 14 images]
[im 8/165  mediastinal]
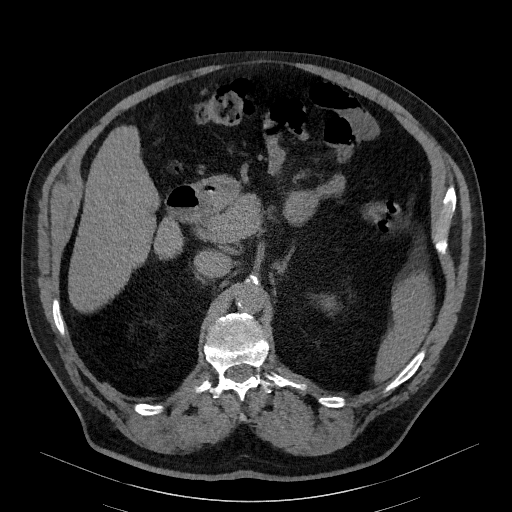
[im 8/165  lung]
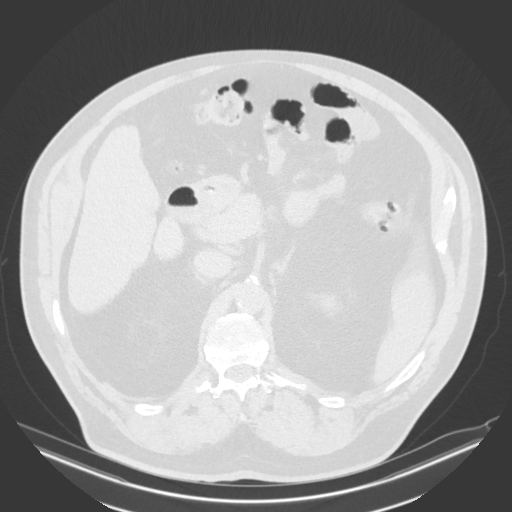
[im 24/165  lung]
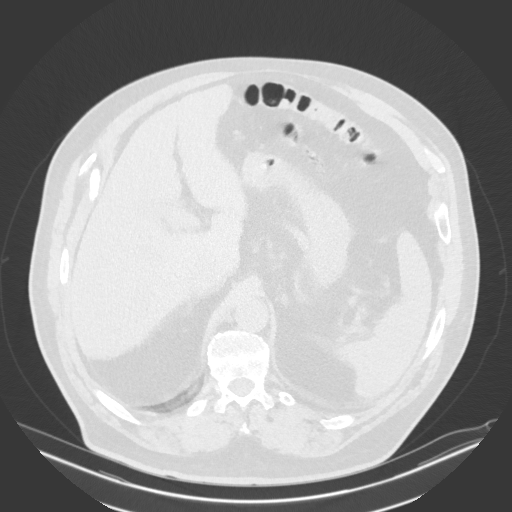
[im 40/165  lung]
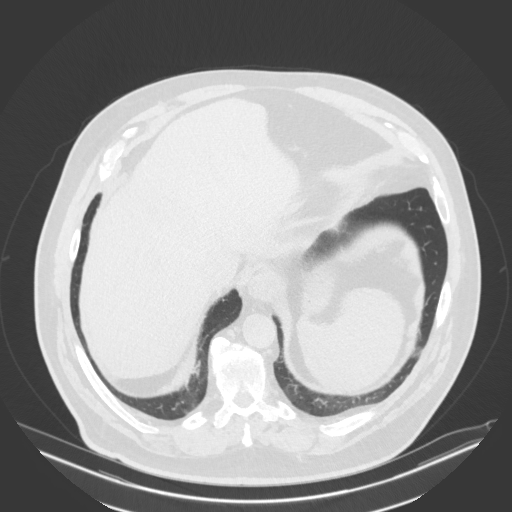
[im 55/165  lung]
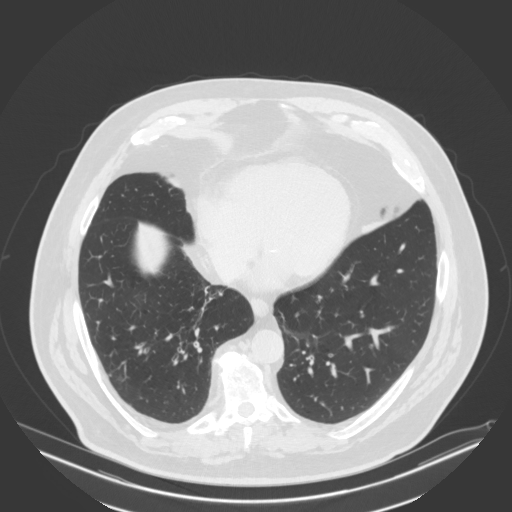
[im 71/165  mediastinal]
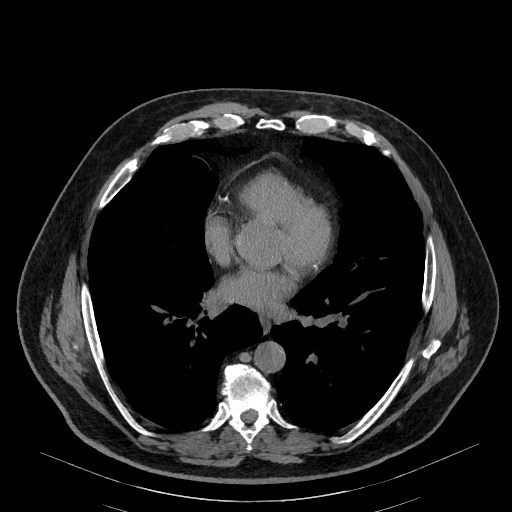
[im 71/165  lung]
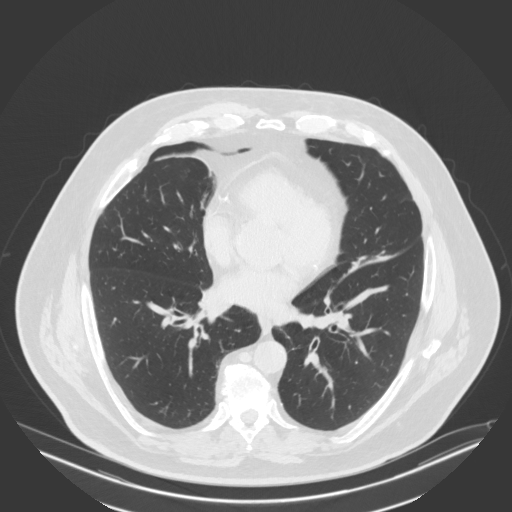
[im 86/165  lung]
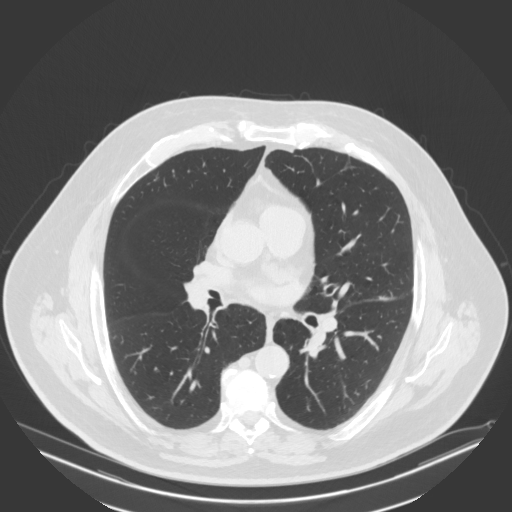
[im 94/165  lung]
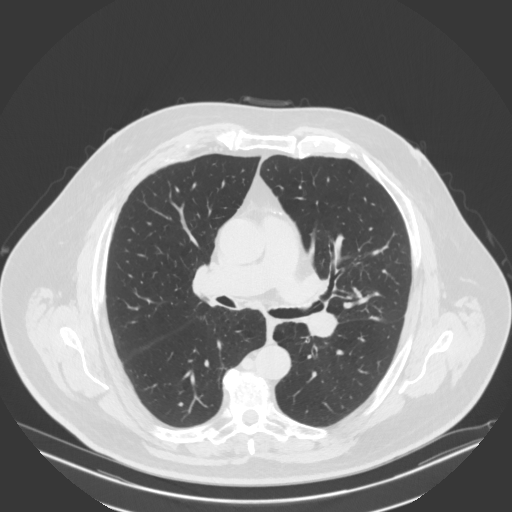
[im 110/165  lung]
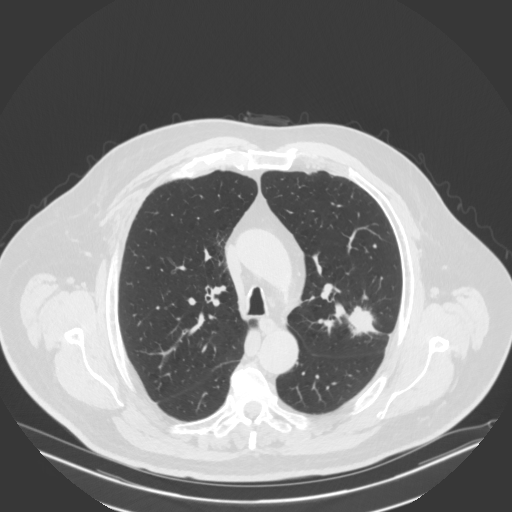
[im 125/165  mediastinal]
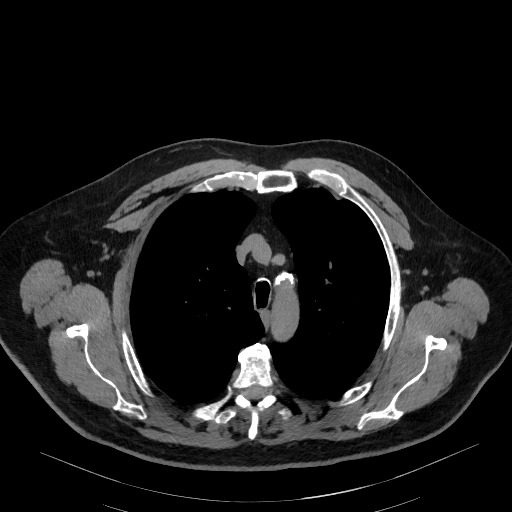
[im 125/165  lung]
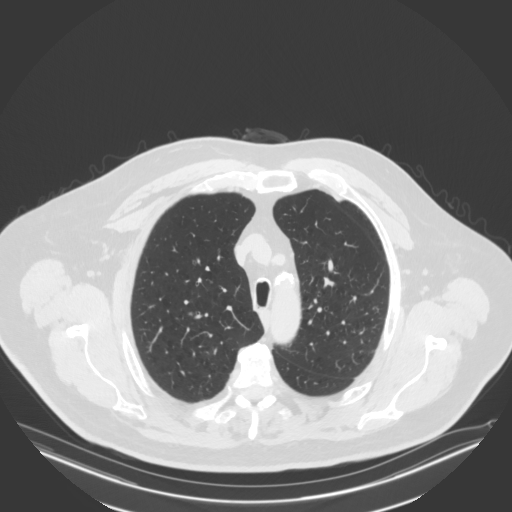
[im 141/165  lung]
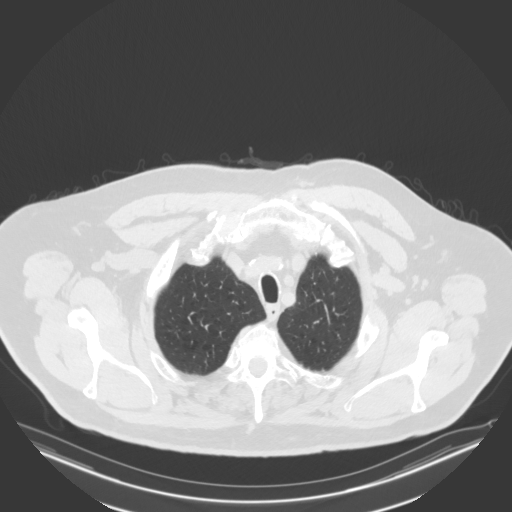
[im 157/165  lung]
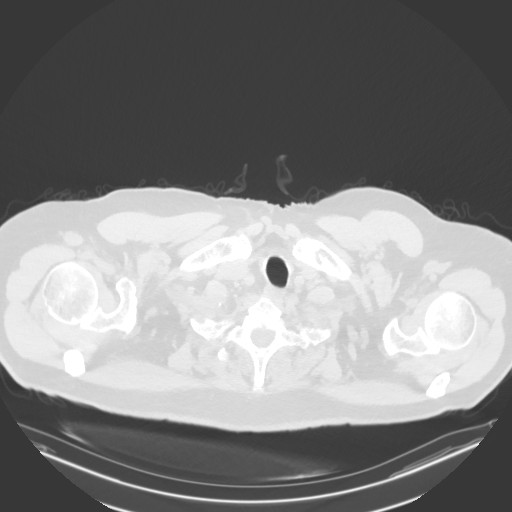

[Series 4: coronal · coronal · 0.67mm/px · 3 of 180 slices shown]
[im 36/180  lung]
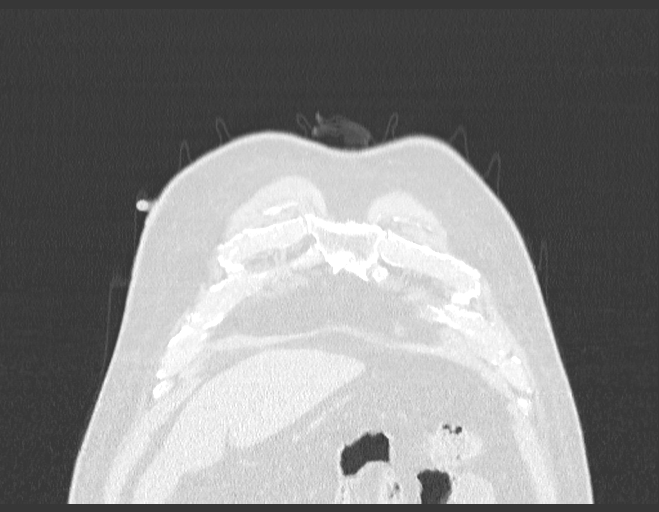
[im 72/180  lung]
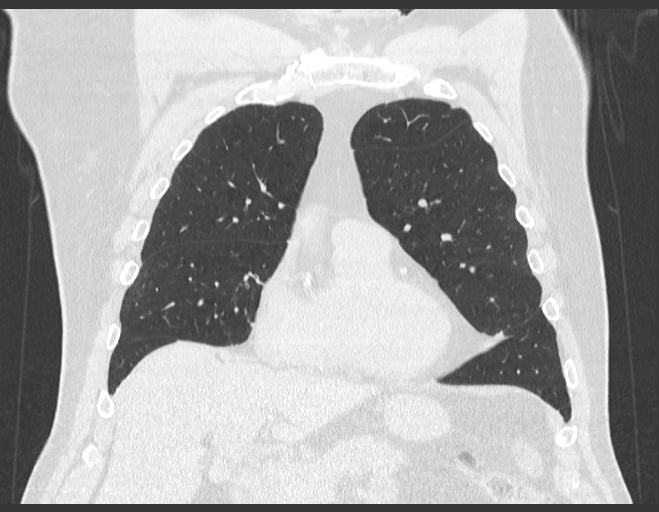
[im 108/180  lung]
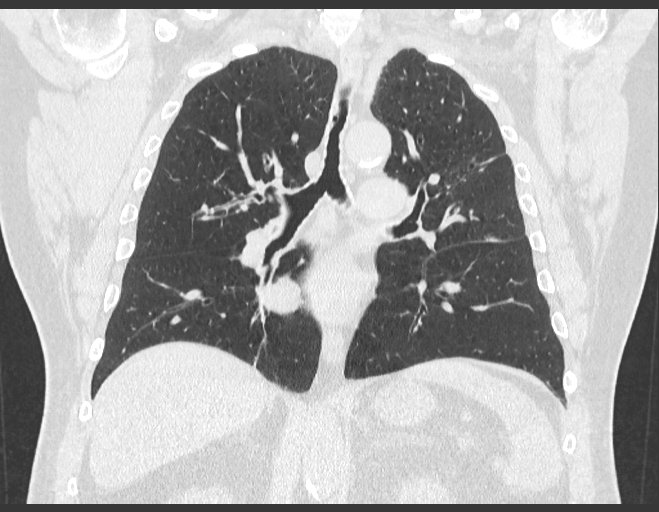

[14 of 36 positions shown; findings below may reference images not displayed]

FINDINGS: LUNGS AND PLEURA:  Emphysematous changes are identified. Progressive soft tissue 
mass within the posterior aspect of the left upper lobe. The main superior 
portion is only mildly increased previously 2.1 x 1.7 cm, currently at 2.2 x
cm. However, this now tracks down on to the left hilum and on image 62 measures 
3.6 x 1.4 cm previously 2 separate components. This was significantly FDG avid 
back in November 2020. The right lower lobe nodule currently measures 1 cm in 
greatest diameter. Last November measured just over 0.8 cm. There is bronchial 
wall thickening seen in multiple locations suggesting underlying bronchitis. 
MEDIASTINUM:  Marked coronary calcifications. I do not see new or developing 
adenopathy on this noncontrasted exam. 
CHEST WALL/AXILLA: No mass or adenopathy.  
UPPER ABDOMEN: Previous cholecystectomy. 
MUSCULOSKELETAL: Degenerative changes. No fracture or destructive changes seen.
IMPRESSION: Enlarging left upper lobe mass very concerning for primary malignancy. No 
evidence of metastatic disease seen. Please see discussion above. 
Slight enlargement of the nodule in the right posterior sulcus. Was not 
previously FDG avid. Increased by 1 or 2 mm since last November. Significance 
uncertain. 
Suspect changes of underlying bronchitis. 
Emphysematous changes, atherosclerotic changes and degenerative changes. 
Postoperative changes. 
RADIATION DOSE REDUCTION: All CT scans are performed using radiation dose 
reduction techniques, when applicable.  Technical factors are evaluated and 
adjusted to ensure appropriate moderation of exposure.  Automated dose 
management technology is applied to adjust the radiation doses to minimize 
exposure while achieving diagnostic quality images.

## 2021-10-31 NOTE — Telephone Encounter (Signed)
Patient said he finally got the medication so he is all set

## 2021-12-02 IMAGING — CT PET CT SCAN TUMOR IMAGING SKULL TO THIGH
1 of 3 series · 1 of 25 positions shown · non-contrast
Comparison: none

________________________________________________________________________________________________ 
PET CT SCAN TUMOR IMAGING SKULL TO THIGH, 12/02/2021 [DATE]: 
CLINICAL INDICATION:  Spiculated progressively enlarging LEFT upper lobe mass 
highly suspicious for malignancy
TECHNIQUE: A dose of 12.0 millicuries of 18-FDG was administered intravenously 
and skull to thigh PET scanning was performed at 66 minutes. Tomographic scans 
were reconstructed in axial, coronal, and sagittal projections. The data was 
reconstructed into a three-dimensional volume rendered images and reviewed in a 
rotational cine loop. Serum blood glucose at the time of injection was 108 
mg/dl. 
COMPARISON EXAMINATION:  Prior PET/CT December 08, 2020.

[Series 6733: (wb_nac) body · axial · 4.0mm · 4.00mm/px · 1 of 255 slices shown]
[im 146/255]
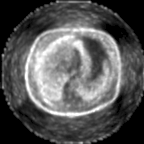

[1 of 25 positions shown; findings below may reference images not displayed]

FINDINGS: The irregularly marginated centrally located LEFT upper lobe mass at the level 
of the superior LEFT hilum has shown further interval progression predominantly 
along its inferior aspect as seen on the low-dose CT in comparison to the prior 
low-dose CT from December 08, 2020. This lesion continues to show high level FDG 
avidity with a max SUV of 31.6, previously
There is no evidence for FDG avid mediastinal or hilar lymph nodes. There is 
mild focal activity in the region of the superior RIGHT carotid sheath in the 
neck showing max SUV of 4.4 not corresponding to an abnormality on the low-dose 
CT and of questionable clinical significance. 
There is no evidence for FDG avid hepatic, adrenal or skeletal metastasis. 
There is incidental skeletal muscle activity most notably in the intercostal 
muscles. The PET images are otherwise unremarkable. 
The small nodule in the RIGHT lower lobe currently measuring 10 mm on axial 
image 106 is not significant changed and does not exhibit visible FDG avidity. 
Severe coronary artery calcifications are incidentally noted. Prominent 
atherosclerotic calcifications are at the carotid artery bifurcations. The 
gallbladder is surgically absent. Left renal cyst incidentally noted. 
Uncomplicated colonic diverticulosis is present.
IMPRESSION: 1. There has been slight further enlargement of the morphologically suspicious 
LEFT upper lobe lesion as compared to the prior low-dose CT from December 08, 2020. There has been a substantial interval increase in degree of FDG avidity. 
This lesion is consistent with a progressive pulmonary malignancy. 
2. The small RIGHT lower lobe nodule remains stable and does not exhibit FDG 
avidity. 
3. There is no PET imaging evidence for FDG avid mediastinal or hilar 
lymphadenopathy and no evidence for FDG avid distant skeletal, hepatic or 
adrenal metastasis.

## 2022-03-23 IMAGING — CT CT CHEST WITHOUT CONTRAST
2 of 5 series · 14 of 36 positions shown, 17 images · non-contrast
Comparison: CT from 10/24/2021 as well as PET/CT from 12/02/2021

________________________________________________________________________________________________ 
CT CHEST WITHOUT CONTRAST, 03/23/2022 [DATE]: 
CLINICAL INDICATION: Lung mass 
A search for DICOM formatted images was conducted for prior CT imaging studies 
completed at a non-affiliated media free facility.
TECHNIQUE: The chest was scanned from base of neck through the lung bases 
without contrast on a high resolution low dose CT scanner. Routine MPR and MIP 
reconstruction images were performed. Count of known CT and Cardiac Nuclear 
Medicine studies performed in the previous 12 months = < .>

[Series 2: chest w/o 2.0 i31s 3 · axial · non-contrast · 0.84mm/px · z∈[-280,-34]mm · 11 of 149 slices shown, 14 images]
[im 13/149  mediastinal]
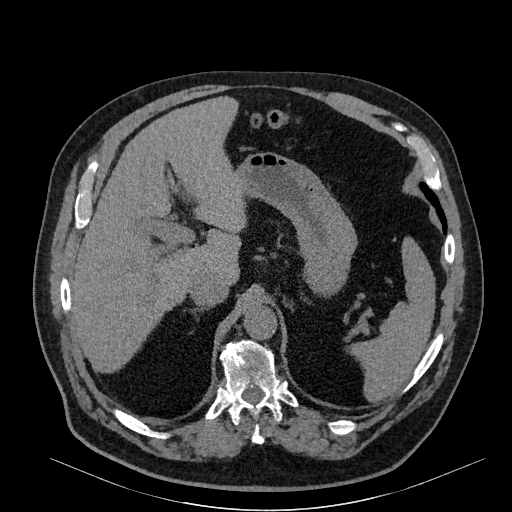
[im 13/149  lung]
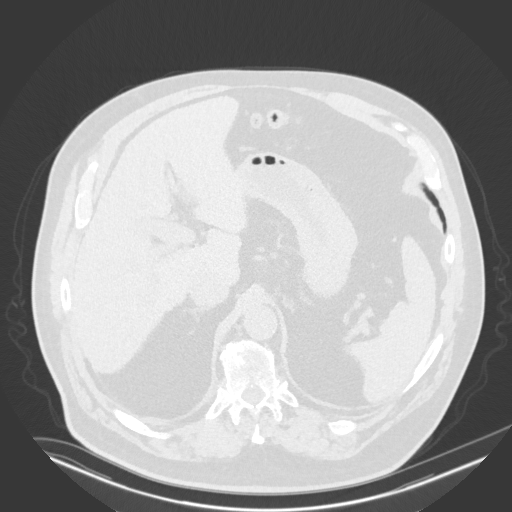
[im 25/149  lung]
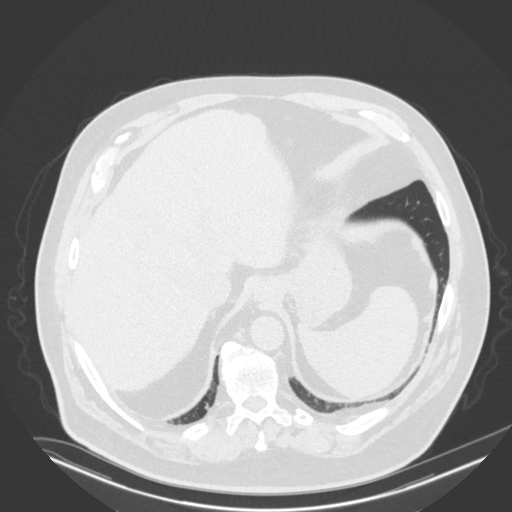
[im 38/149  lung]
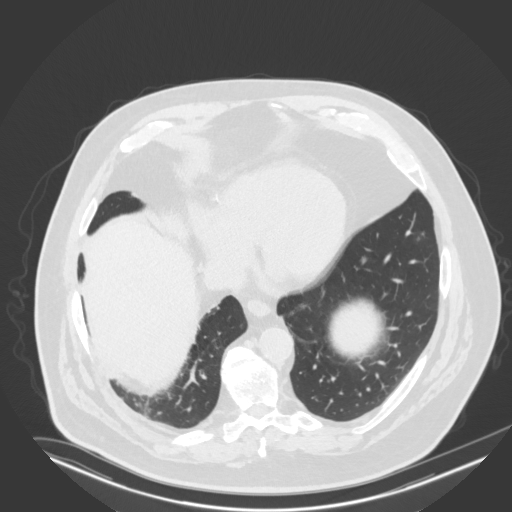
[im 50/149  lung]
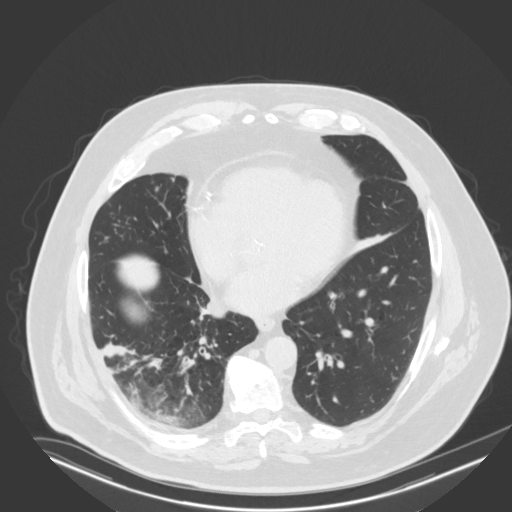
[im 62/149  mediastinal]
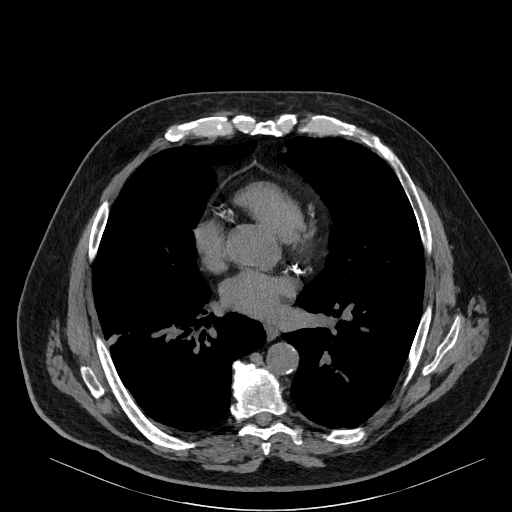
[im 62/149  lung]
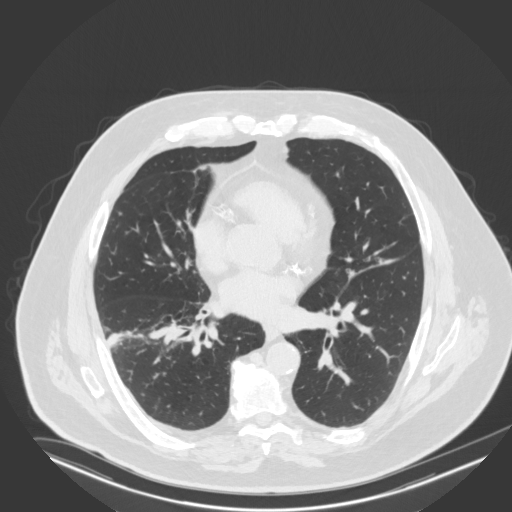
[im 75/149  lung]
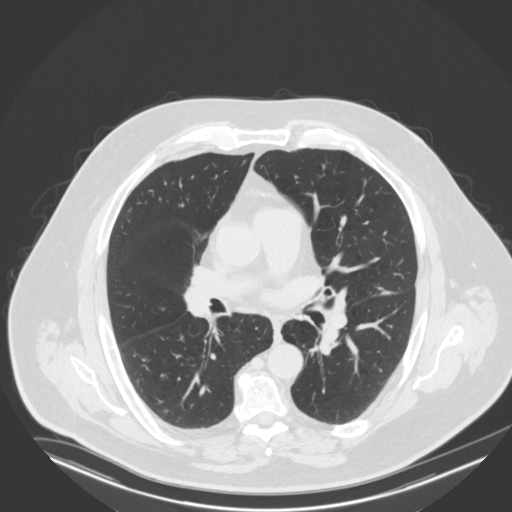
[im 87/149  lung]
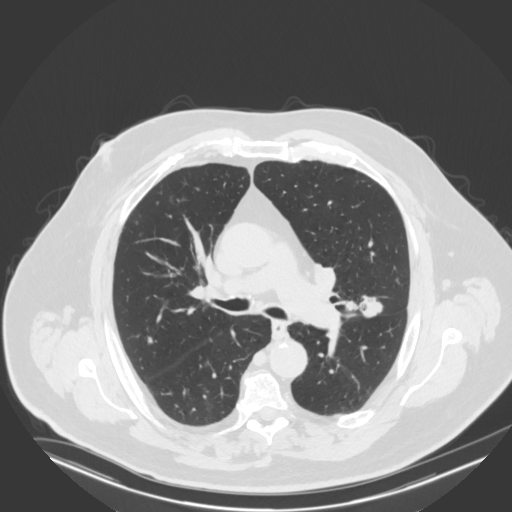
[im 99/149  lung]
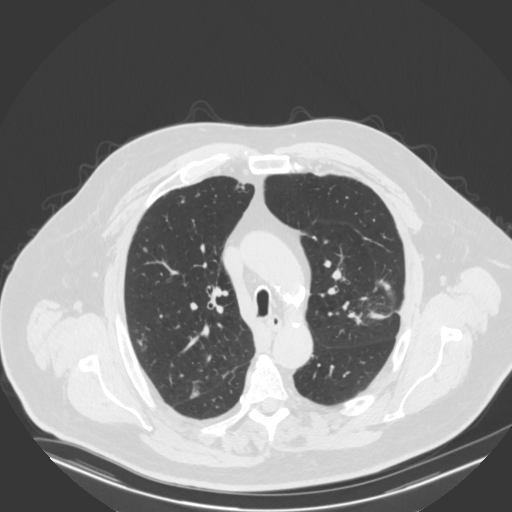
[im 112/149  mediastinal]
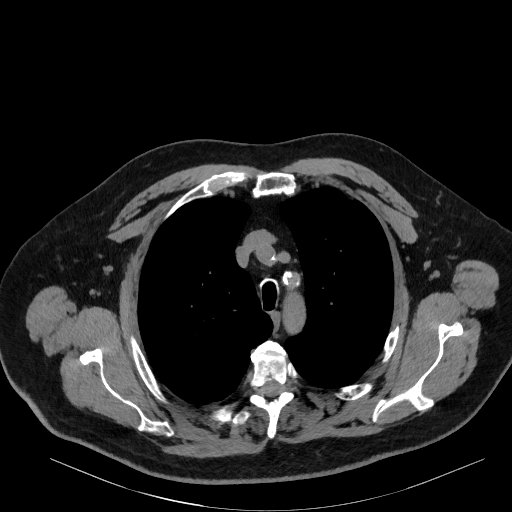
[im 112/149  lung]
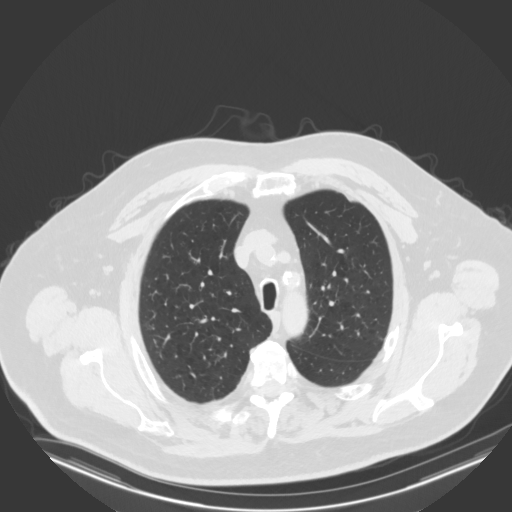
[im 124/149  lung]
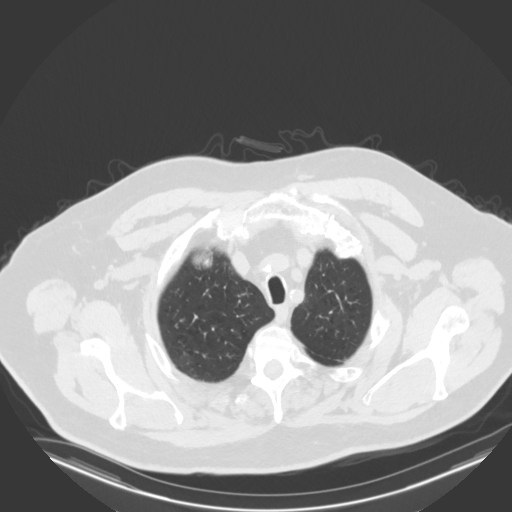
[im 136/149  lung]
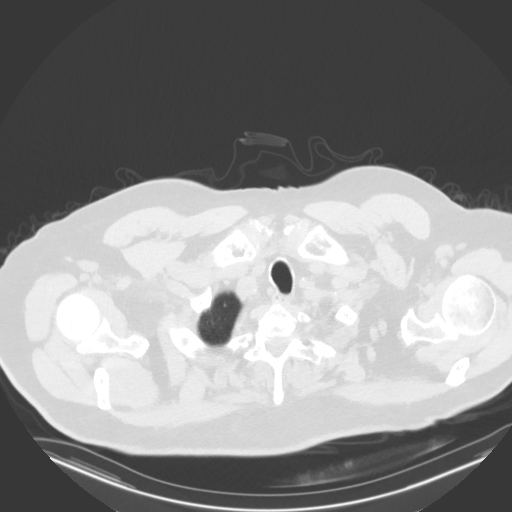

[Series 4: coronal · coronal · 0.57mm/px · 3 of 170 slices shown]
[im 34/170  lung]
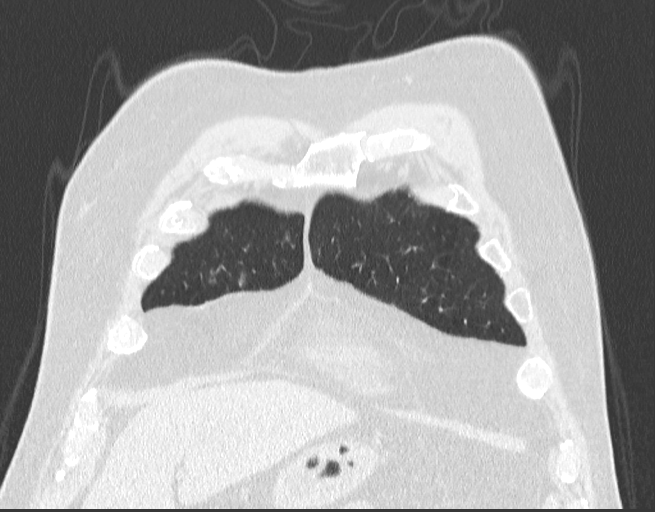
[im 68/170  lung]
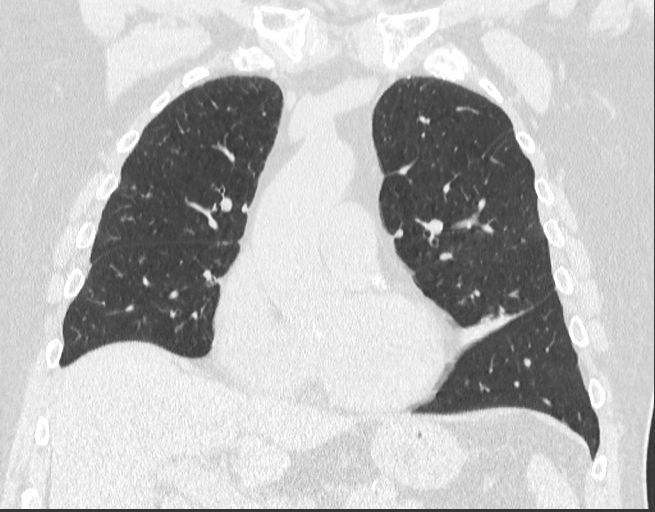
[im 102/170  lung]
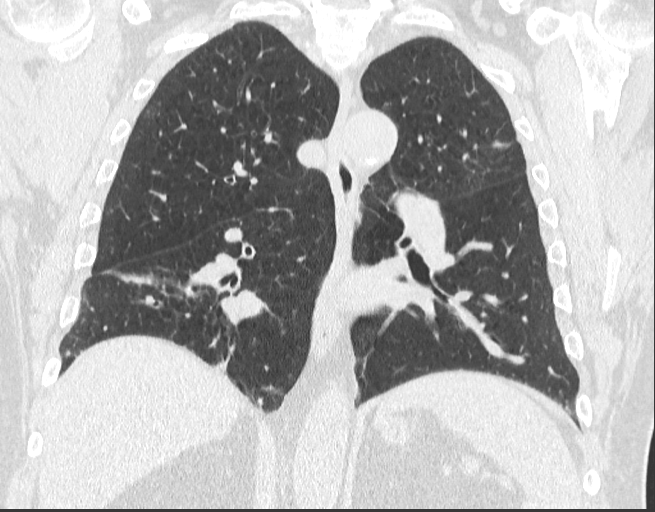

[14 of 36 positions shown; findings below may reference images not displayed]

FINDINGS: LUNGS AND PLEURA:  There is slight further enlargement of mass in the left upper 
lobe with the left para hilar region now measuring up to 3.3 x 1.7 cm as seen on 
axial image #60 of series #3 and previously measuring 3.4 x 1.5 cm on prior 
PET/CT. This also had increased uptake on prior PET/CT. There is also been 
interval enlargement of right lower lobe lung nodule now measuring 3.8 cm x
cm and previously measuring 1.0 cm in greatest diameter. This did not have 
increased uptake on previous PET. There is also some new infiltrate or 
pneumonitis in the right lower lobe. Mild emphysematous changes in the lungs. 
There are some scattered and new micronodularity artery in the right upper lobe. 
There is a new groundglass density in the right upper lobe measuring 9 mm x 6 mm 
as seen on axial image #35 of series #3. 
MEDIASTINUM:  No adenopathy. Normal heart size. No pericardial effusion. Severe 
coronary artery calcifications noted. 
CHEST WALL/AXILLA: No mass or adenopathy.  
UPPER ABDOMEN: Negative. Breast lobectomy. 
MUSCULOSKELETAL: No acute abnormality.
IMPRESSION: Slight further enlargement of left upper lobe mass which had increased uptake on 
prior PET and remain suspicious for tumor. 
No abnormal enlargement of right lower lobe nodule now measuring up to 3.8 x
cm and previously measuring 1.0 cm which did not have increased uptake on 
previous PET. There is also surrounding new pneumonitis or infiltrate. As there 
is been progression of this finding neoplastic etiology cannot be excluded, may 
consider follow-up with PET/CT or short-term follow-up CT. 
Some scattered ill-defined micronodularity which is new seen in the right upper 
lobe as well as new groundglass density in the right upper lobe measuring 9 mm x 
6 mm as seen on axial image #35 of series #3. 
RADIATION DOSE REDUCTION: All CT scans are performed using radiation dose 
reduction techniques, when applicable.  Technical factors are evaluated and 
adjusted to ensure appropriate moderation of exposure.  Automated dose 
management technology is applied to adjust the radiation doses to minimize 
exposure while achieving diagnostic quality images.

## 2022-04-17 IMAGING — CT CT CHEST WITHOUT CONTRAST
1 of 3 series · 14 of 30 positions shown, 18 images · non-contrast
Comparison: Chest CT from March 23, 2022. Additional chest CTs dating back to

________________________________________________________________________________________________ 
CT CHEST WITHOUT CONTRAST, 04/17/2022 [DATE]: 
CLINICAL INDICATION: Follow-up scan, lung infection. 
A search for DICOM formatted images was conducted for prior CT imaging studies 
completed at a non-affiliated media free facility.
TECHNIQUE: The chest was scanned from base of neck through the lung bases 
without contrast on a high resolution low dose CT scanner. Routine MPR and MIP 
reconstruction images were performed. Count of known CT and Cardiac Nuclear 
Medicine studies performed in the previous 12 months = < 2.>

[Series 2: axial · axial · 0.77mm/px · z∈[-372,-52]mm · 14 of 177 slices shown, 18 images]
[im 9/177  mediastinal]
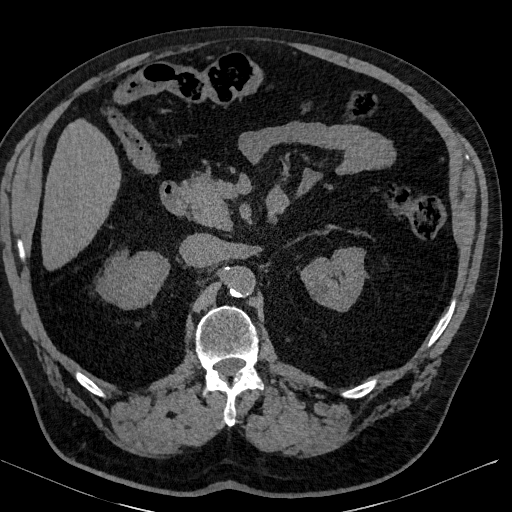
[im 9/177  lung]
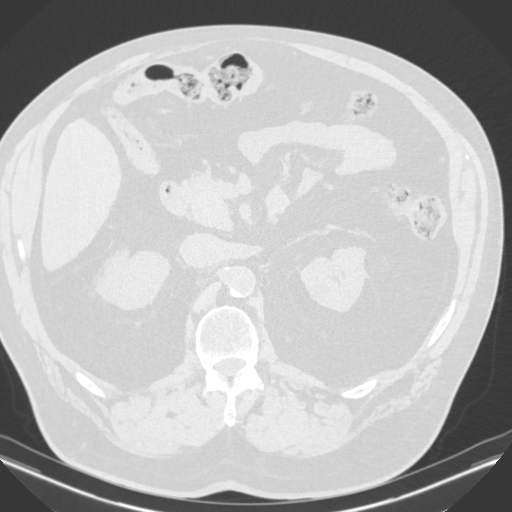
[im 25/177  lung]
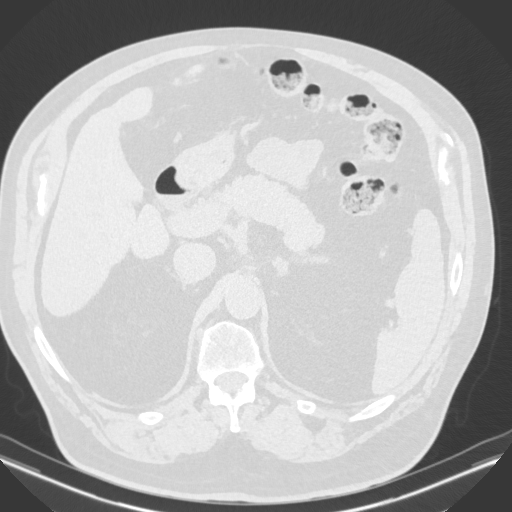
[im 33/177  lung]
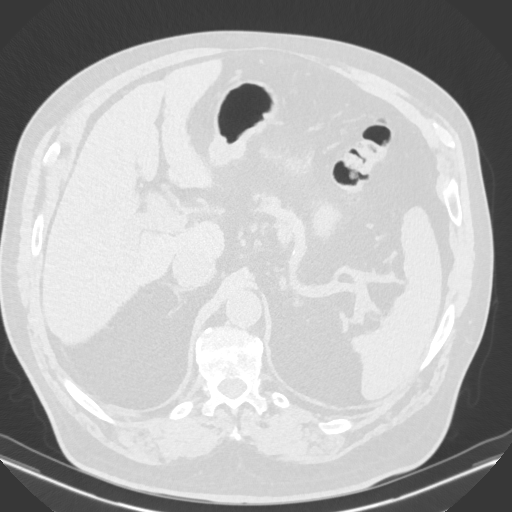
[im 49/177  lung]
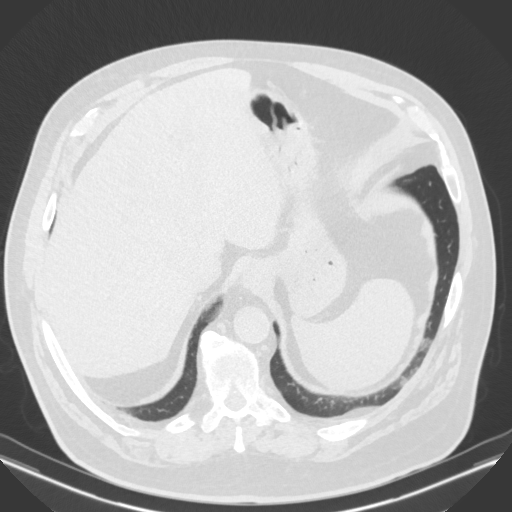
[im 57/177  mediastinal]
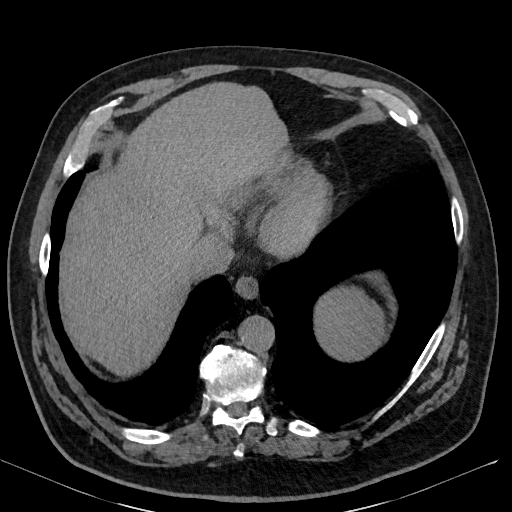
[im 57/177  lung]
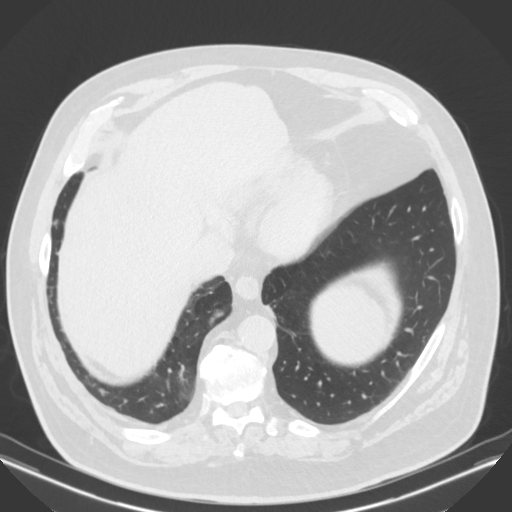
[im 73/177  lung]
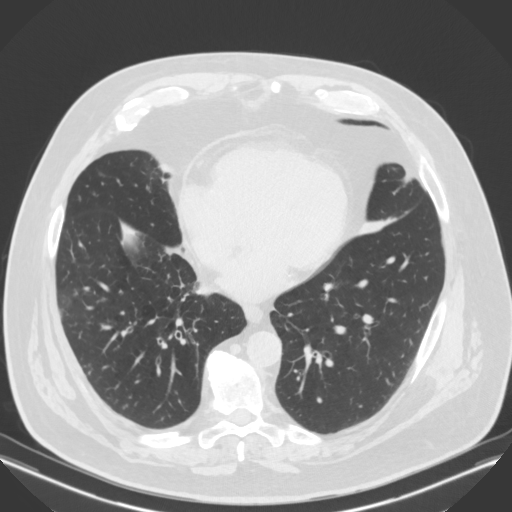
[im 81/177  lung]
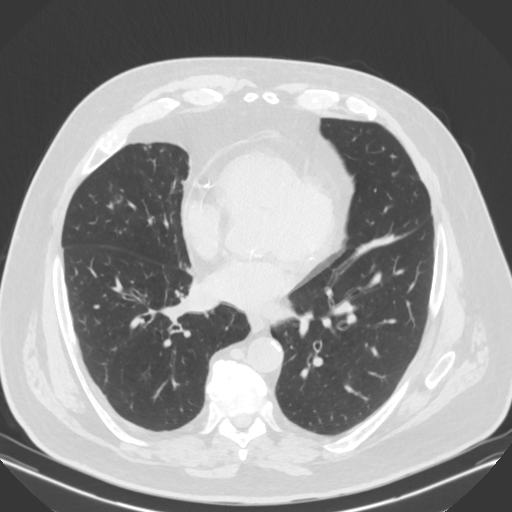
[im 97/177  lung]
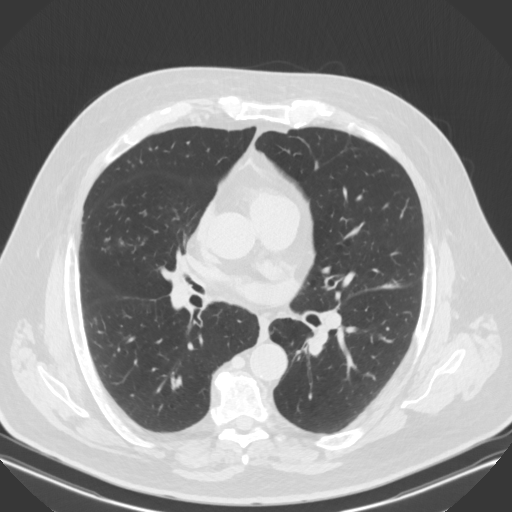
[im 105/177  mediastinal]
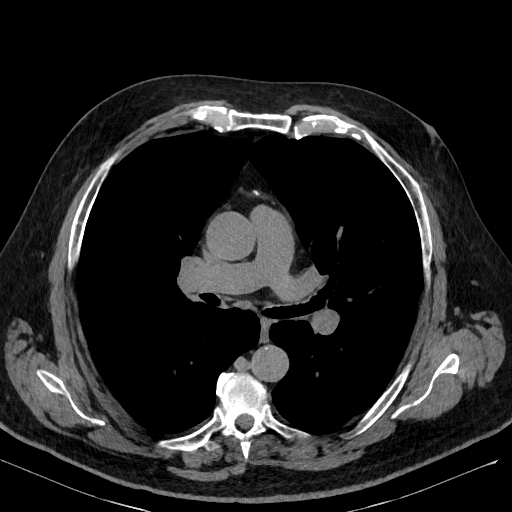
[im 105/177  lung]
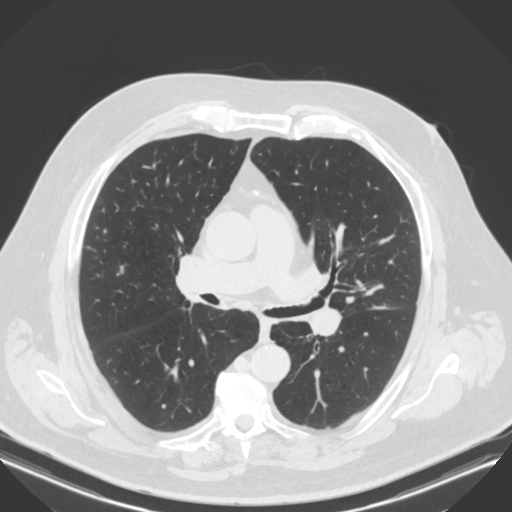
[im 121/177  lung]
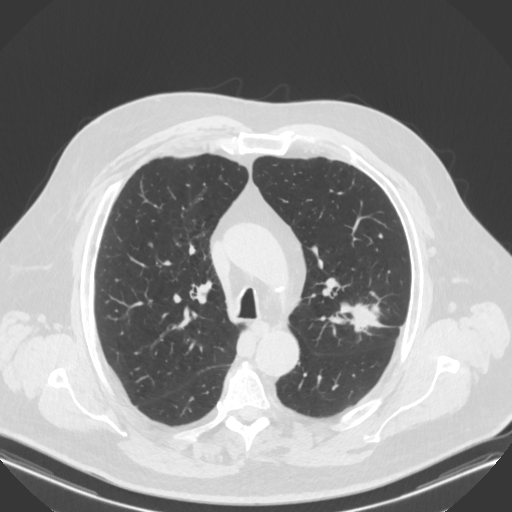
[im 129/177  lung]
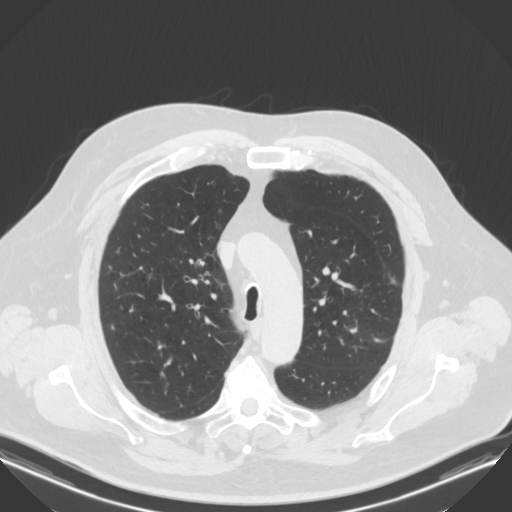
[im 145/177  lung]
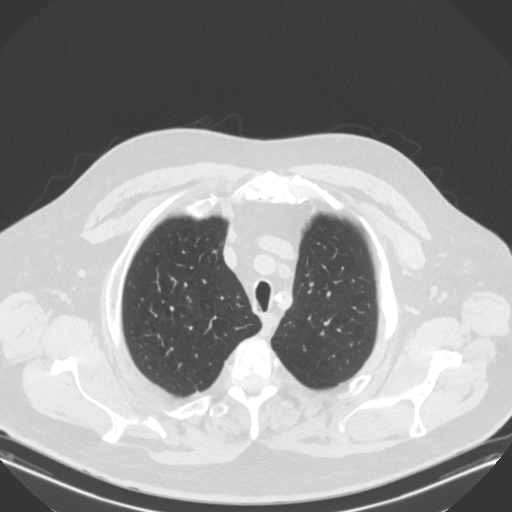
[im 153/177  mediastinal]
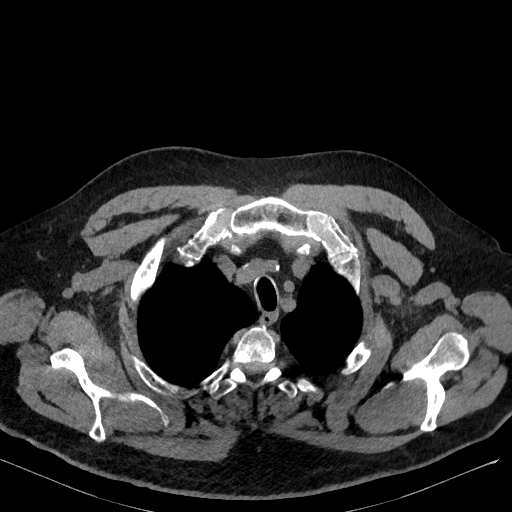
[im 153/177  lung]
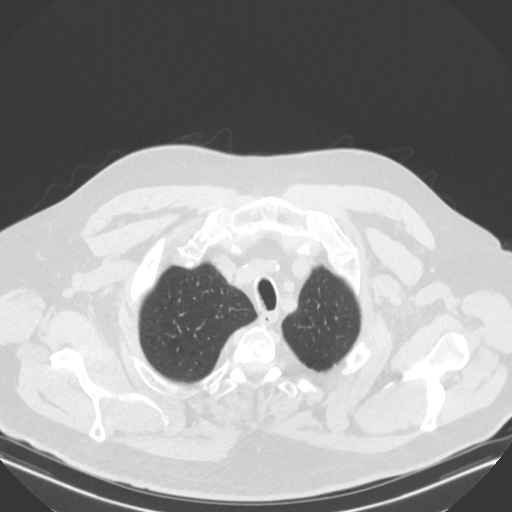
[im 169/177  lung]
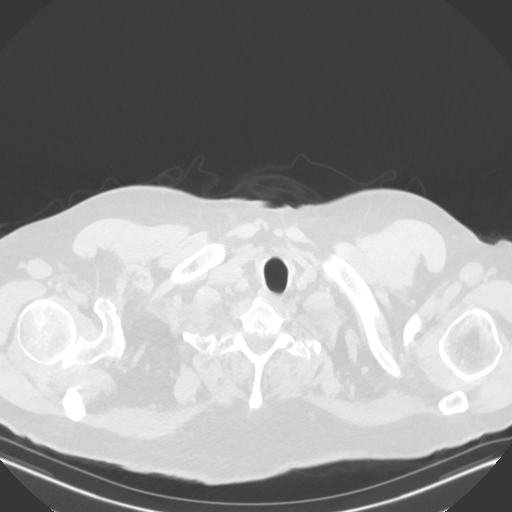

[14 of 30 positions shown; findings below may reference images not displayed]

FINDINGS: --------------------------------------------------------------------------- 
LUNGS AND PLEURA:  Emphysematous changes are present. Left upper lobe perihilar 
masslike airspace opacity measuring 4.0 x 2.1 cm, essentially stable compared to 
prior CT, although markedly progressed compared to the CT from July 01, 2019. 
Right lower lobe 8 mm nodule (image 111 of axial images) with associated 
groundglass opacity and tree-in-bud opacity in the right lower lobe. This has 
improved compared to the prior CT. On 5855 examination the 8 mm nodule measured 
1.5 cm. Stable tree-in-bud opacity in the right lower lobe and right middle 
lobe. Left lower lobe calcified granuloma. 
MEDIASTINUM:  No pathologic mediastinal  lymphadenopathy.   
CARDIOVASCULAR:  Heart size is normal.  Extensive coronary artery calcification. 
 Thoracic vascular calcification.   
LOWER NECK:  No focal mass. 
CHEST WALL/AXILLA:  No mass or adenopathy.  
OSSEOUS STRUCTURES:  No acute osseous abnormality. Scattered degenerative 
changes.  
UPPER ABDOMEN:  Status post cholecystectomy. 
---------------------------------------------------------------------------
IMPRESSION: 1.  Stable left upper lobe perihilar lesion which is concerning for malignancy 
given the progression since 5855 exam. 
2.  Improvement in right lower lobe airspace opacities with associated 8 mm 
right lower lobe nodule, previously measuring up to 1.5 cm in 5855. 
3.  Stable tree-in-bud opacities noted in the lungs from infectious/inflammatory 
bronchiolitis. 
RADIATION DOSE REDUCTION: All CT scans are performed using radiation dose 
reduction techniques, when applicable.  Technical factors are evaluated and 
adjusted to ensure appropriate moderation of exposure.  Automated dose 
management technology is applied to adjust the radiation doses to minimize 
exposure while achieving diagnostic quality images.

## 2022-06-21 IMAGING — CT CT CHEST WITHOUT CONTRAST
2 of 5 series · 14 of 36 positions shown, 17 images · non-contrast
Comparison: CT 04/17/2022.

________________________________________________________________________________________________ 
CT CHEST WITHOUT CONTRAST, 06/21/2022 [DATE]: 
CLINICAL INDICATION: History of COPD productive cough. 
A search for DICOM formatted images was conducted for prior CT imaging studies 
completed at a non-affiliated media free facility.
TECHNIQUE: The chest was scanned from base of neck through the lung bases 
without contrast on a high resolution low dose CT scanner. Routine MPR and MIP 
reconstruction images were performed.

[Series 2: chest w/o 2.0 i31s 3 · axial · non-contrast · 0.86mm/px · z∈[-202,+52]mm · 11 of 153 slices shown, 14 images]
[im 13/153  mediastinal]
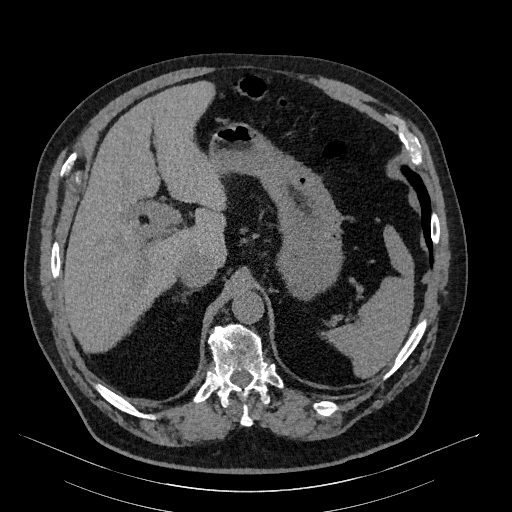
[im 13/153  lung]
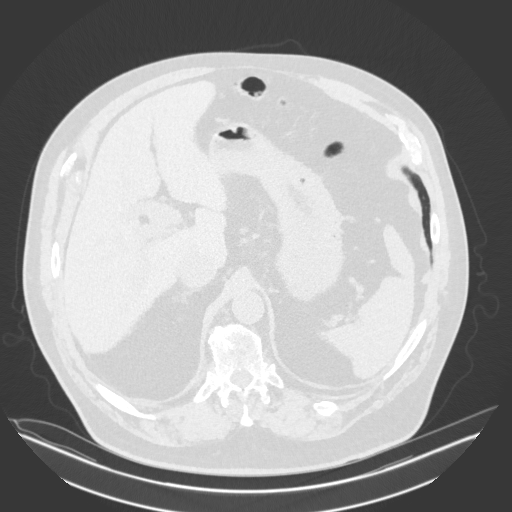
[im 26/153  lung]
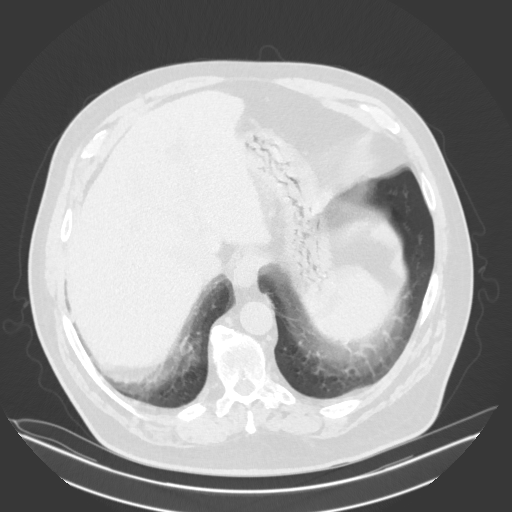
[im 39/153  lung]
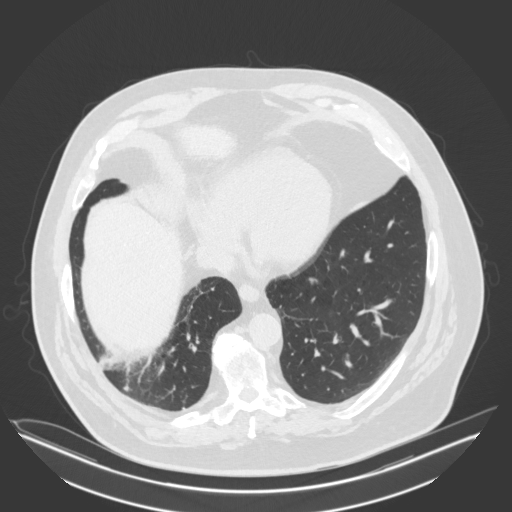
[im 51/153  lung]
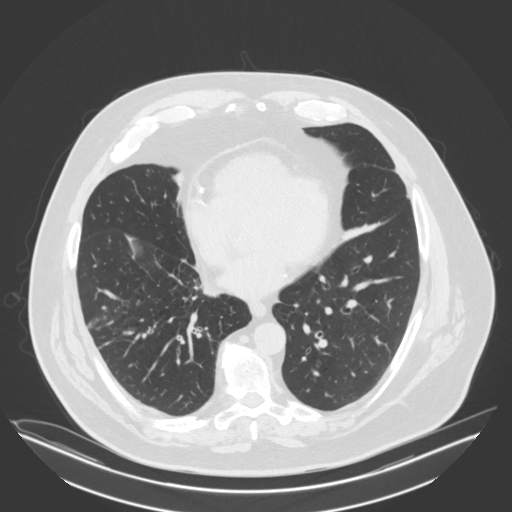
[im 64/153  mediastinal]
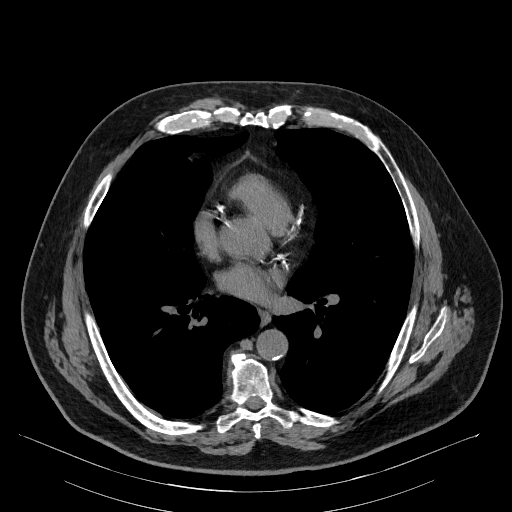
[im 64/153  lung]
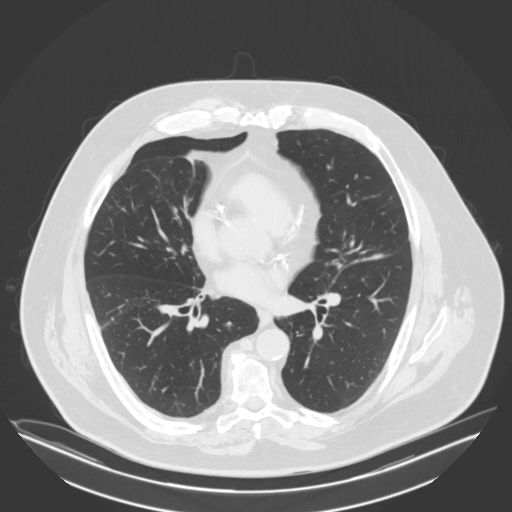
[im 77/153  lung]
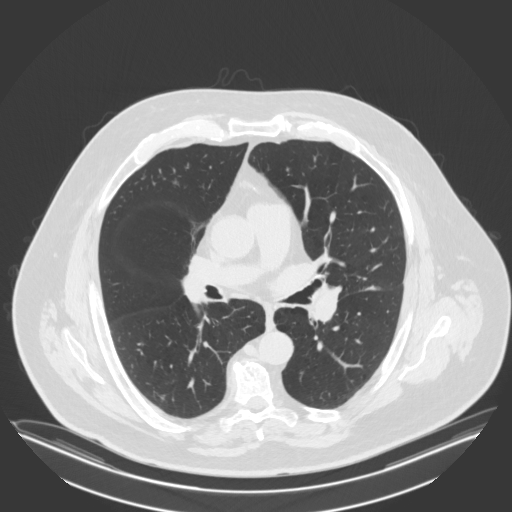
[im 89/153  lung]
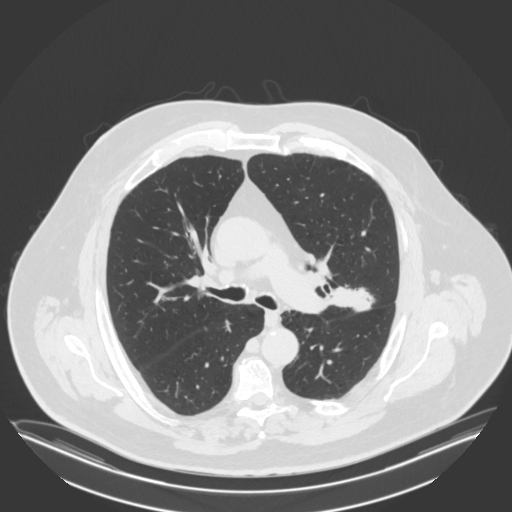
[im 102/153  lung]
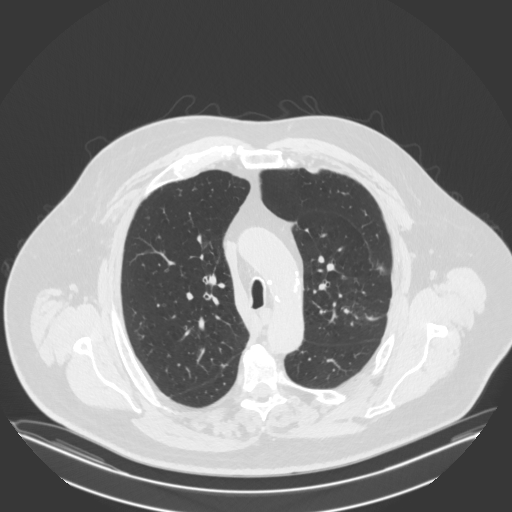
[im 115/153  mediastinal]
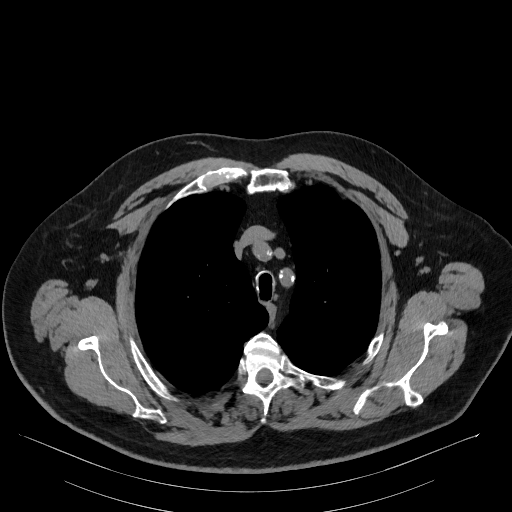
[im 115/153  lung]
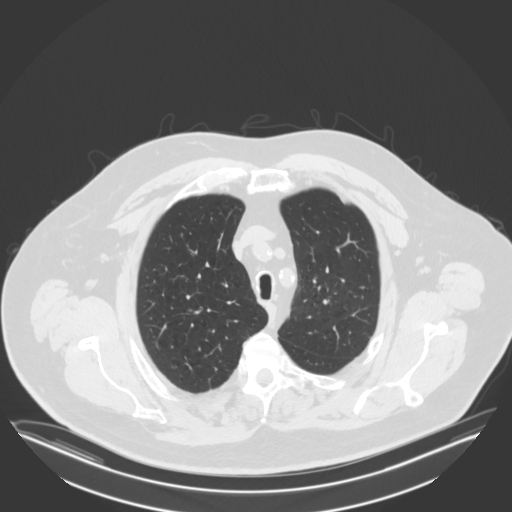
[im 127/153  lung]
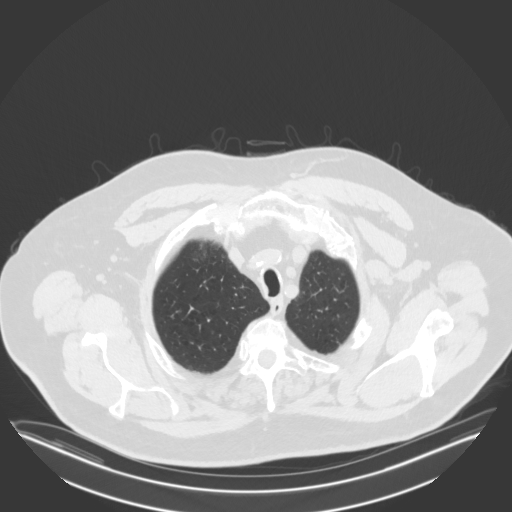
[im 140/153  lung]
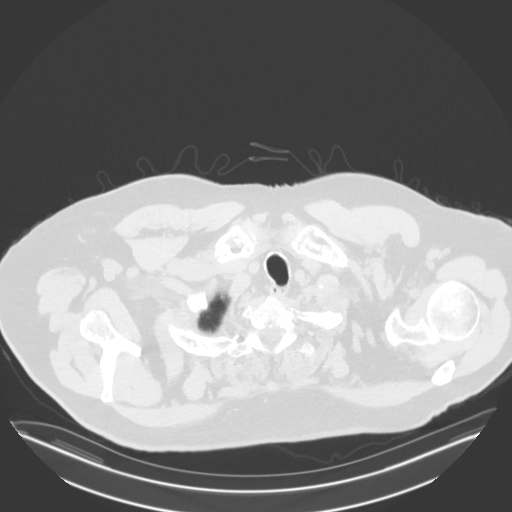

[Series 4: coronal · coronal · 0.59mm/px · 3 of 175 slices shown]
[im 35/175  lung]
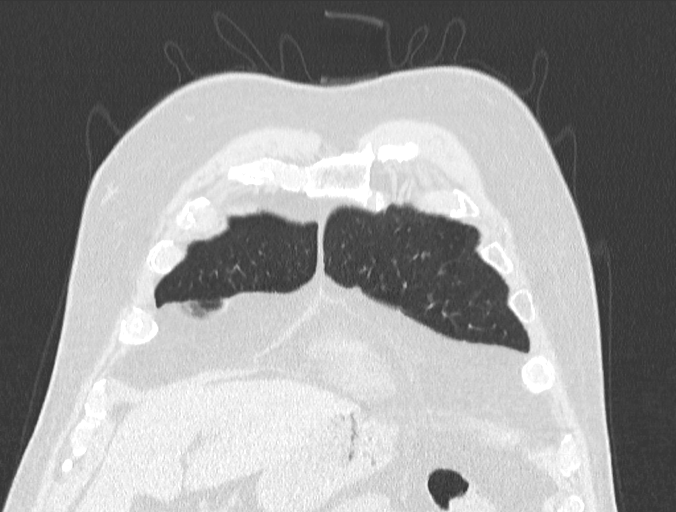
[im 70/175  lung]
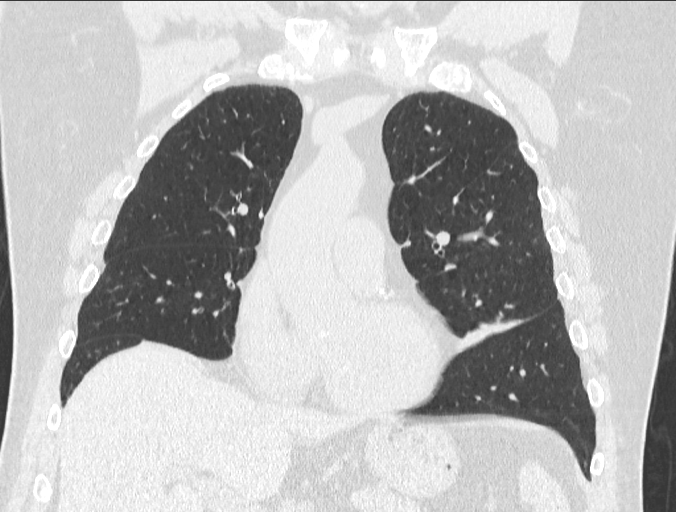
[im 105/175  lung]
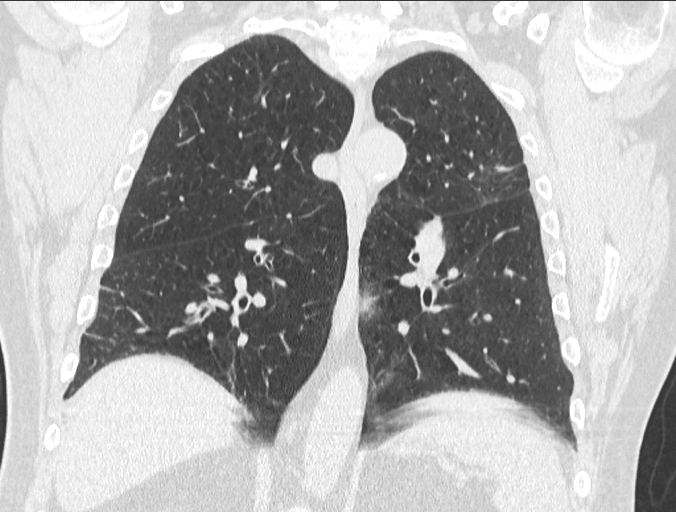

[14 of 36 positions shown; findings below may reference images not displayed]

Count of known CT and Cardiac Nuclear Medicine studies performed in the previous 
12 months = 3.
FINDINGS: LUNGS AND PLEURA:  Emphysematous change. Left upper lobe perihilar masslike 
opacity is stable in size measuring approximately 4 x 2.1 cm right lower lobe 8 
mm noncalcified nodule is stable. Tree-in-bud opacity right middle lobe has 
resolved. Stable scattered noncalcified and calcified micronodules. 
Peribronchial thickening/bronchiectasis is unchanged. No new area of 
consolidation. No pleural effusion.  
MEDIASTINUM:  No adenopathy. Normal heart size. No pericardial effusion. No 
coronary artery calcifications noted. 
CHEST WALL/AXILLA: No mass or adenopathy.  
UPPER ABDOMEN: Negative. 
MUSCULOSKELETAL: No acute abnormality.
IMPRESSION: Stable left upper lobe perihilar opacity. 
Stable 8 mm noncalcified nodule right lower lobe. 
Resolution of tree-in-bud opacity right middle lobe. No new acute consolidation 
or effusion. 
In patients between the ages of 50-77 where pulmonary emphysema is noted on CT, 
recommend evaluation for low dose lung cancer screening protocol if patient is 
not already enrolled; as pulmonary emphysema is an independent risk factor for 
lung cancer. 
RADIATION DOSE REDUCTION: All CT scans are performed using radiation dose 
reduction techniques, when applicable.  Technical factors are evaluated and 
adjusted to ensure appropriate moderation of exposure.  Automated dose 
management technology is applied to adjust the radiation doses to minimize 
exposure while achieving diagnostic quality images.

## 2022-09-11 IMAGING — MR MRI BRAIN W/WO CONTRAST
2 of 17 series · 5 of 48 positions shown · IV contrast (Gadolinium)
Comparison: None.

________________________________________________________________________________________________ 
MRI BRAIN W/WO CONTRAST, 09/11/2022 [DATE]: 
CLINICAL INDICATION: Lung cancer, staging.
TECHNIQUE: Multiplanar, multiecho position MR images of the brain were performed 
without and with 11 mL of Gadavist were injected intravenously by hand. 4 mL of 
Gadavist discarded. Patient was scanned on a 1.5T magnet.

[Series 1002: T1 post-contrast · coronal · 1.0mm · 0.24mm/px · 3 of 180 slices shown (1 of 2)]
[im 30/180]
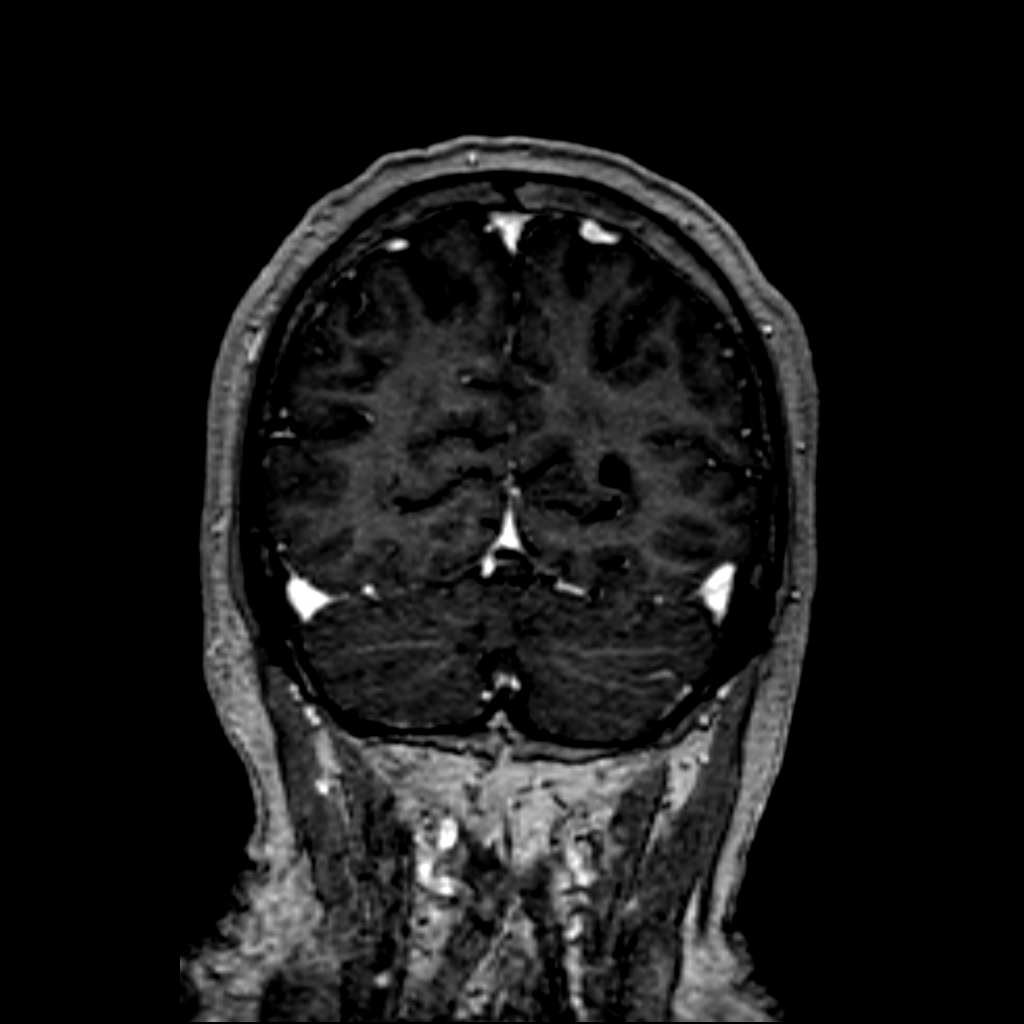
[im 90/180]
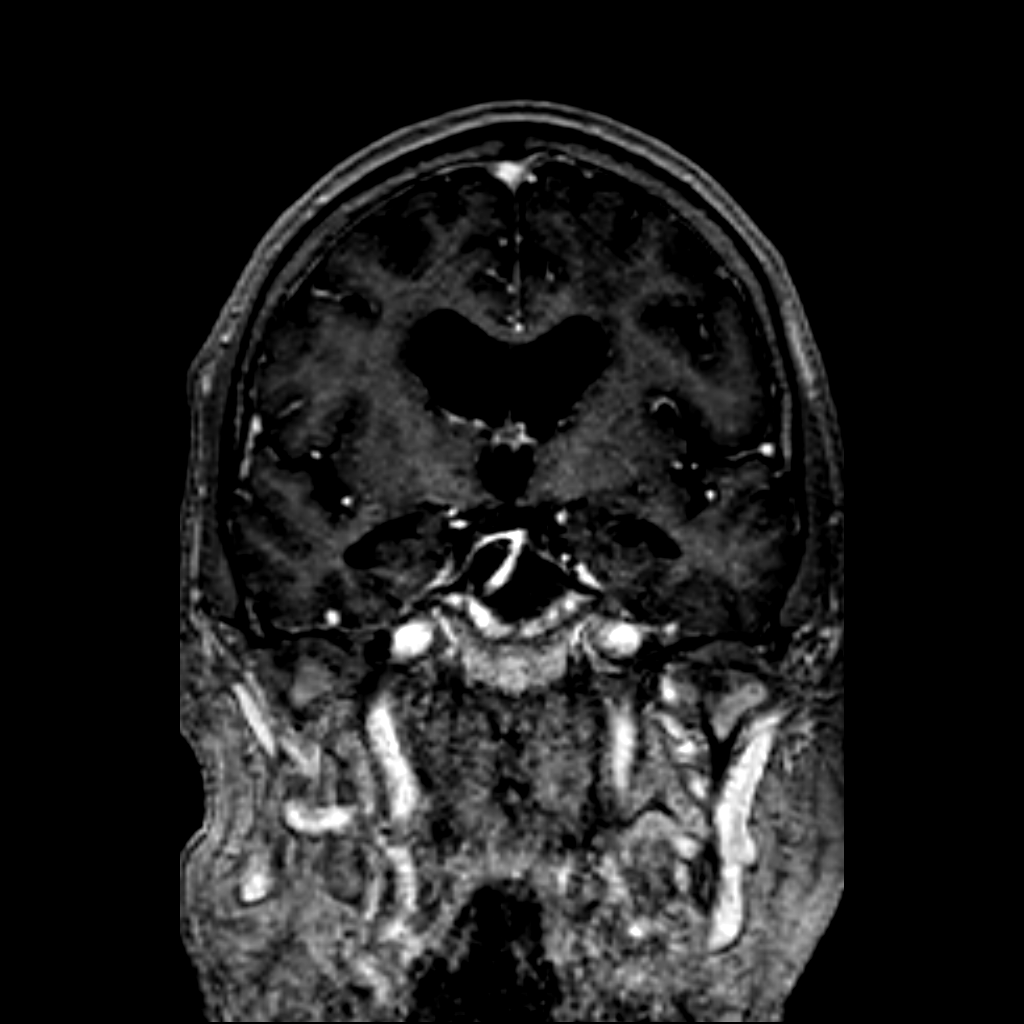
[im 150/180]
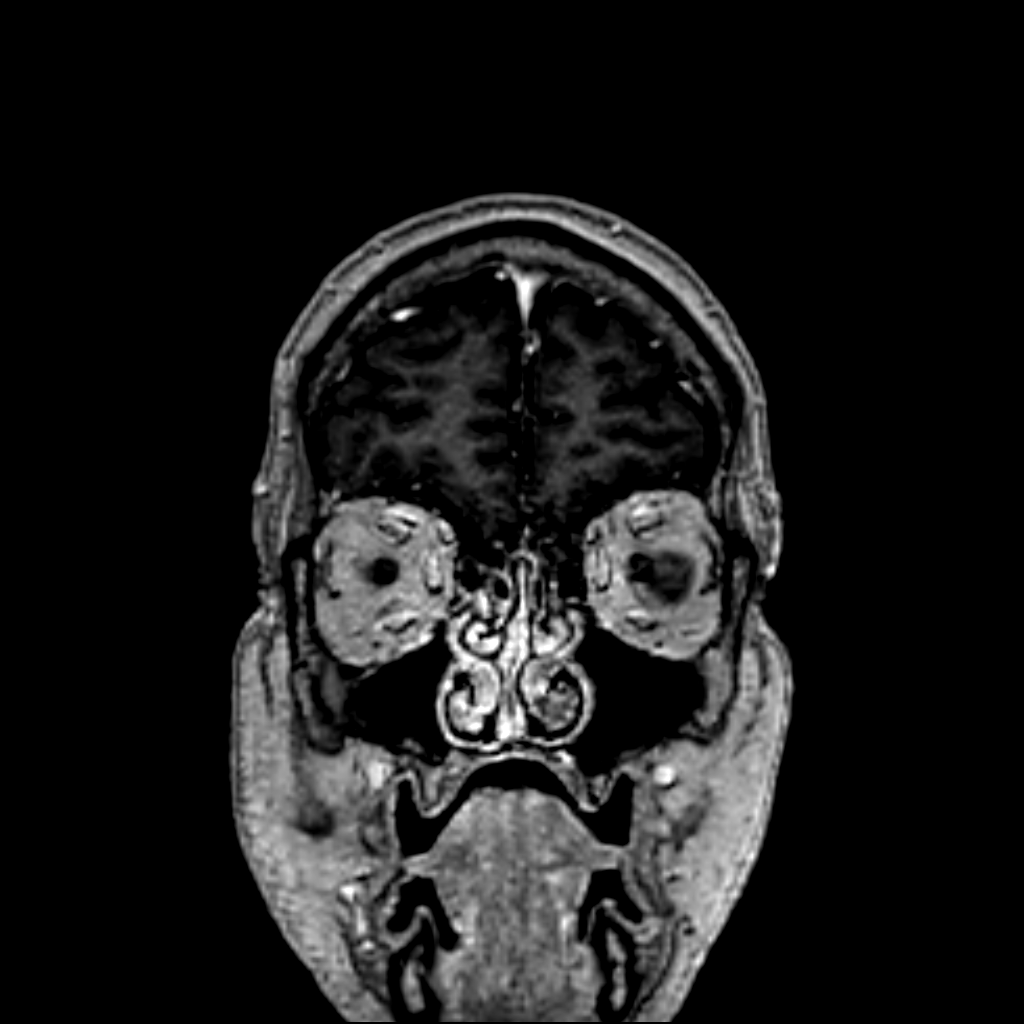

[Series 1003: T1 post-contrast · axial · 1.0mm · 0.24mm/px · z∈[-69,-14]mm · 2 of 170 slices shown (2 of 2)]
[im 29/170]
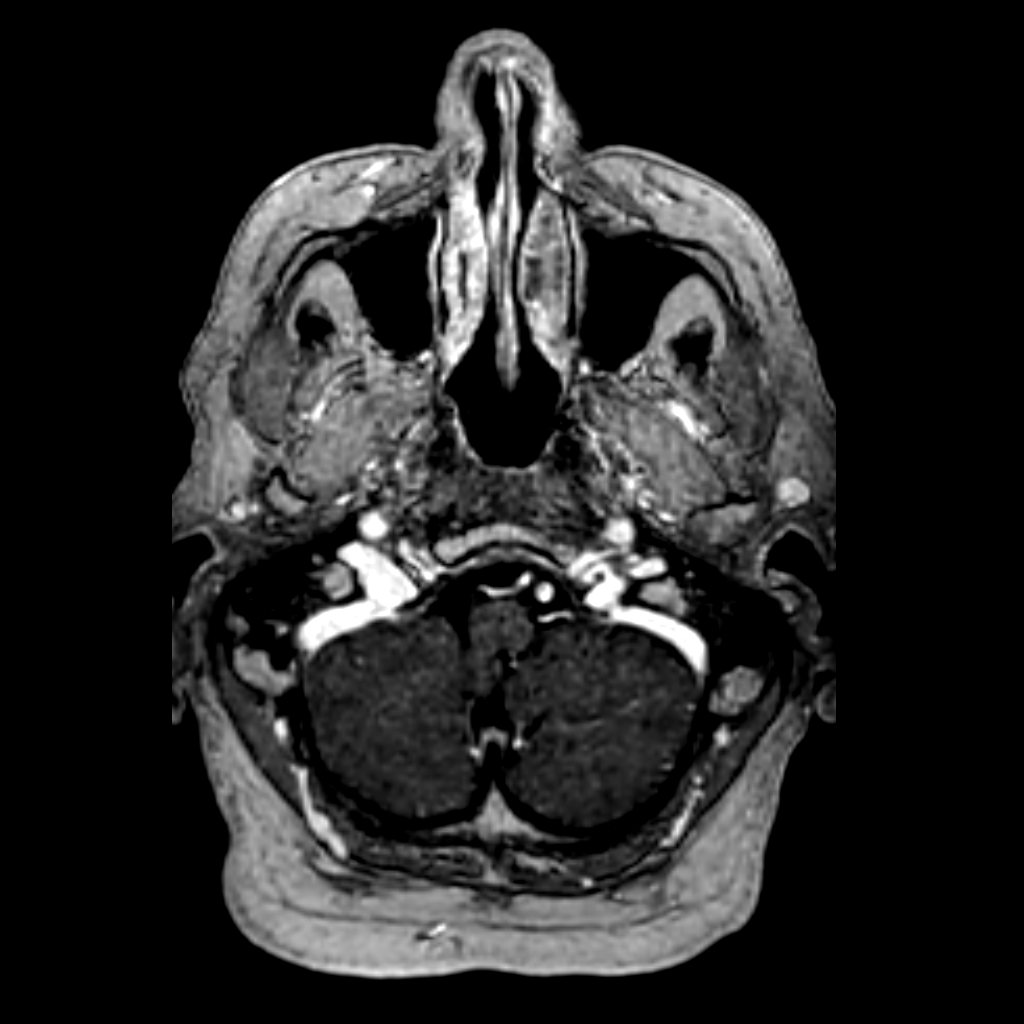
[im 85/170]
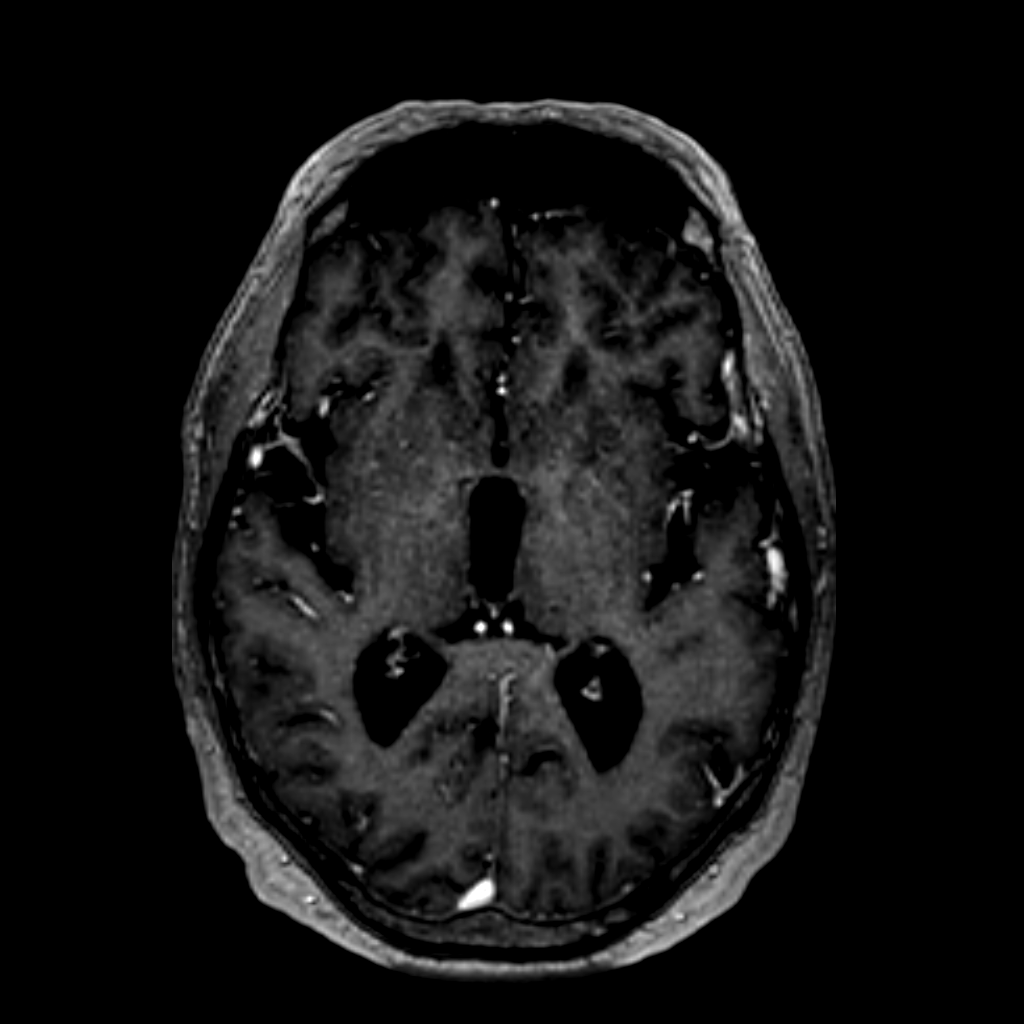

[5 of 48 positions shown; findings below may reference images not displayed]

FINDINGS: -------------------------------------------------------------------------------- 
------------------------- 
INTRACRANIAL: 
There are numerous foci of susceptibility artifact along the left superior 
frontal lobe along the cortex/subcortical white matter. Hemosiderin staining on 
the left superior frontal sulci from sequela of prior subarachnoid hemorrhage. 
Gliotic change along left superior frontal subcortical to deep white matter. 
Focal encephalomalacia along the left superior frontal lobe with surrounding 
gliotic change. No acute hemorrhage, midline shift, mass effect. Periventricular 
and deep white matter change, probably secondary to microangiopathy. No 
pathologic enhancement in the brain. No acute ischemia.  Francisco Isrrael index measures 
0.36 (threshold is 0.30), concerning for central volume loss versus normal 
pressure hydrocephalus.  
-------------------------------------------------------------------------------- 
----------------------- 
OTHER: 
ORBITS/SINUSES/T-BONES:  Status post bilateral cataract extraction.  Fluid 
opacification of left mastoid air cells and left middle ear cavity. Mild fluid 
in the right mastoid air cells.  Mucosal thickening in the left sphenoid sinus. 
MARROW SIGNAL/SOFT TISSUES: No focal suspect signal abnormality.  
-------------------------------------------------------------------------------- 
-------------------
IMPRESSION: 1.  No metastatic disease in the brain. 
2.  Focal chronic ischemic change along left superior frontal lobe. White matter 
microangiopathic change. 
3.  Sequelae of prior subarachnoid hemorrhage along left frontal lobe. 
4.  Left superior frontal foci of susceptibility artifact which is likely 
secondary to lobar cerebral amyloid angiopathy. 
5.  Central volume loss versus NPH. 
6.  Fluid in the mastoid air cells and left middle ear cavity.

## 2022-11-21 IMAGING — CT CT CHEST WITHOUT CONTRAST
2 of 5 series · 14 of 36 positions shown, 17 images · non-contrast
Comparison: May 2022   
Count of known CT and Cardiac Nuclear Medicine studies performed in the previous 
12 months = 3.

________________________________________________________________________________________________ 
CT CHEST WITHOUT CONTRAST, 11/21/2022 [DATE]: 
CLINICAL INDICATION: History of lung carcinoma. Finished radiation therapy in 
July 2022. History of oral carcinoma 9660. 
A search for DICOM formatted images was conducted for prior CT imaging studies 
completed at a non-affiliated media free facility.
TECHNIQUE: The chest was scanned from base of neck through the lung bases 
without contrast on a high resolution low dose CT scanner. Routine MPR and MIP 
reconstruction images were performed.

[Series 2: chest w/o 2.0 i31s 3 · axial · non-contrast · 0.84mm/px · z∈[-116,+154]mm · 11 of 163 slices shown, 14 images]
[im 14/163  mediastinal]
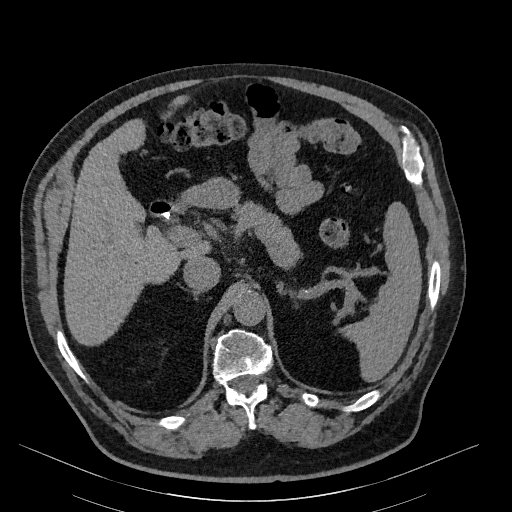
[im 14/163  lung]
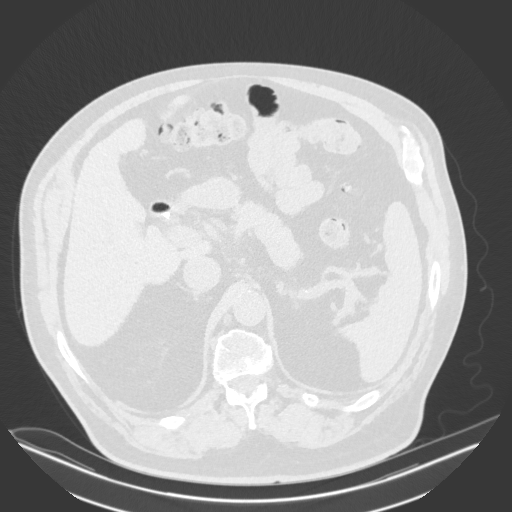
[im 28/163  lung]
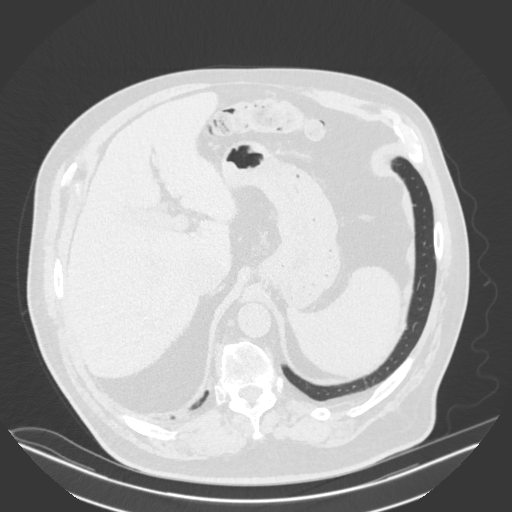
[im 41/163  lung]
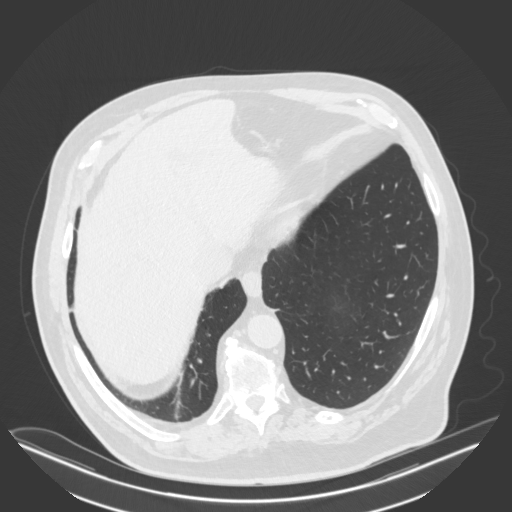
[im 55/163  lung]
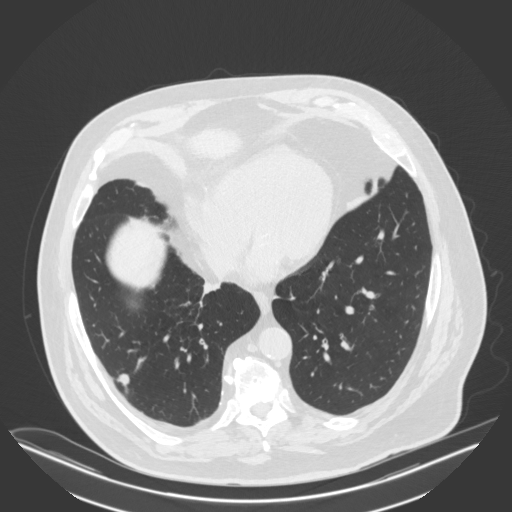
[im 68/163  mediastinal]
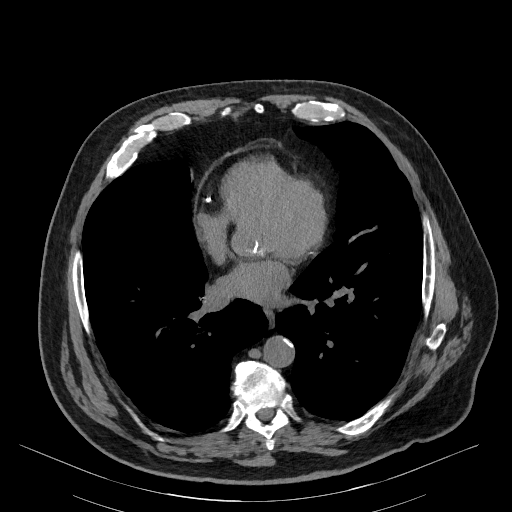
[im 68/163  lung]
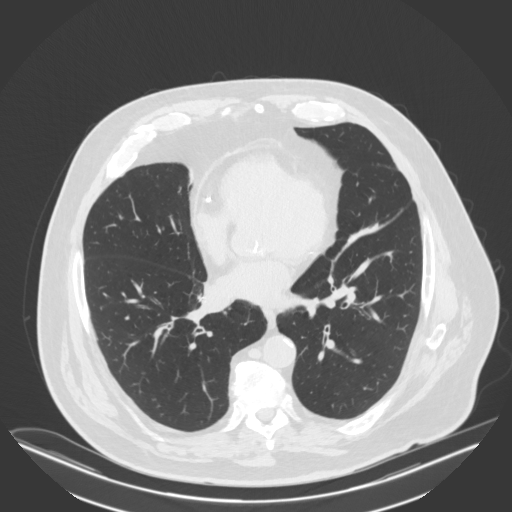
[im 82/163  lung]
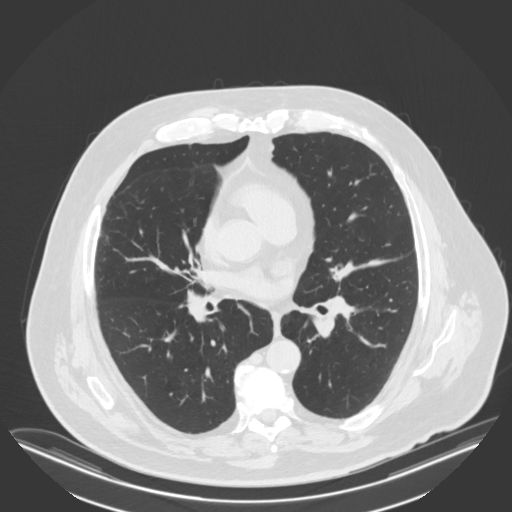
[im 95/163  lung]
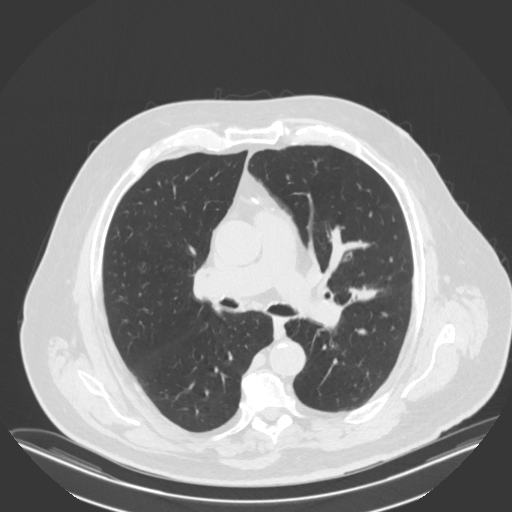
[im 109/163  lung]
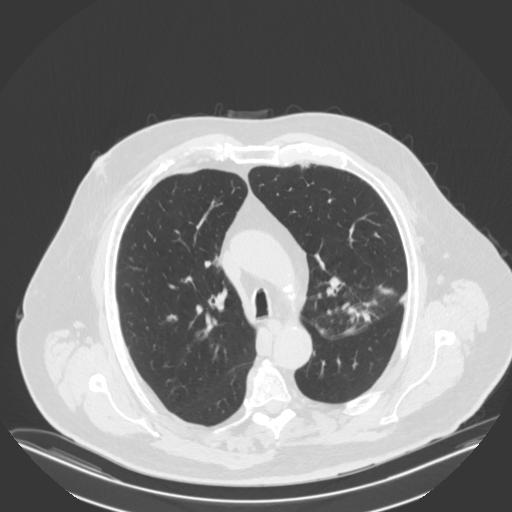
[im 122/163  mediastinal]
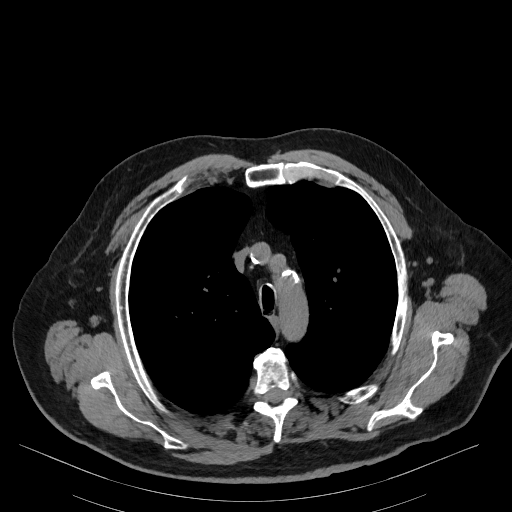
[im 122/163  lung]
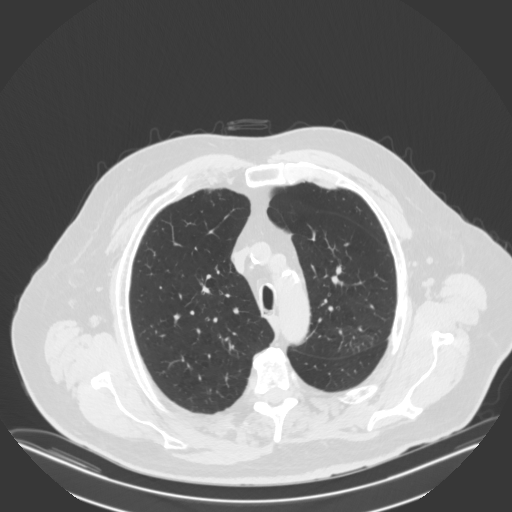
[im 136/163  lung]
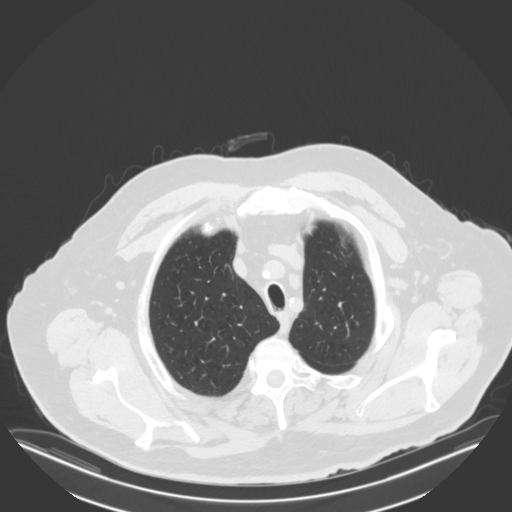
[im 149/163  lung]
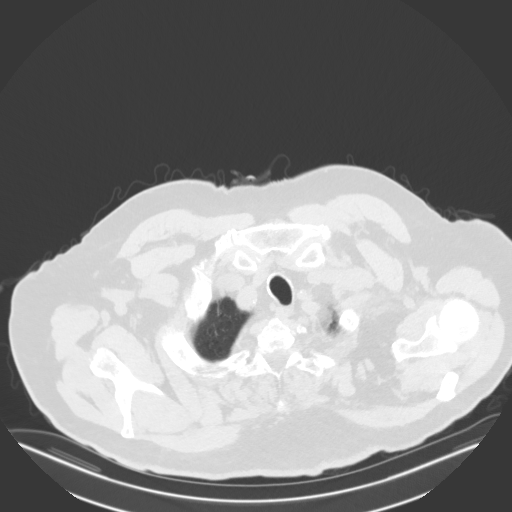

[Series 4: coronal · coronal · 0.67mm/px · 3 of 172 slices shown]
[im 35/172  lung]
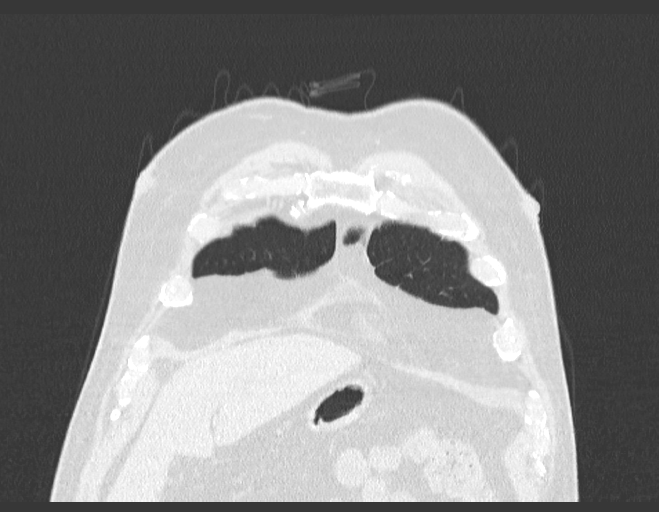
[im 69/172  lung]
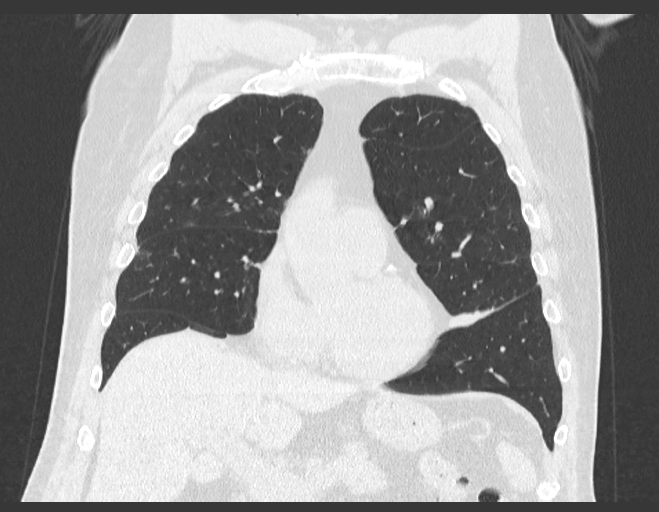
[im 103/172  lung]
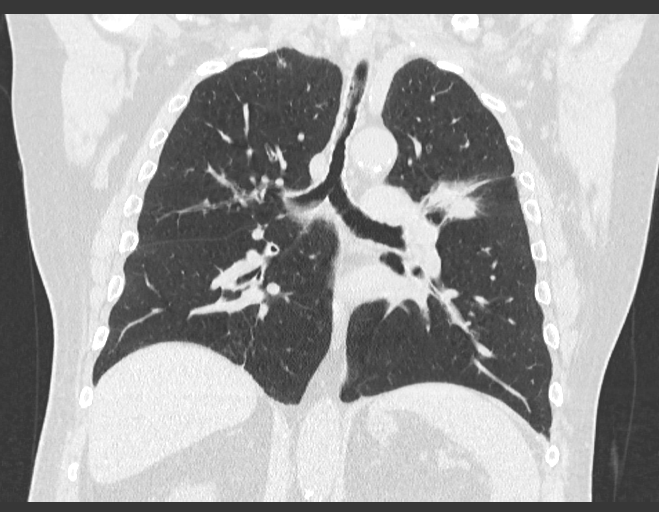

[14 of 36 positions shown; findings below may reference images not displayed]

FINDINGS: LUNGS AND PLEURA:  The suspected posttreatment changes along the major fissure 
within the left upper lobe adjacent to the left hilum are similar to prior 
examination. There is a small nodule which appears to be developing in the right 
apex. Only measures 5 mm on this exam though is developing over the past few 
exams best seen currently on image 17. There is a stable nodule in the right 
lower lobe on image 110 at 1.1 cm similar to multiple prior exams back to 
November 2021. An adjacent nodule is developing at 9 mm on image 112 increasing 
in size from the most recent exam in May. Back in February there are 
consolidative changes in this region. Emphysematous changes are identified. 
MEDIASTINUM:  No adenopathy. Normal heart size. No pericardial effusion. 
Moderate to marked coronary calcifications. 
CHEST WALL/AXILLA: No mass or adenopathy.  
UPPER ABDOMEN: Previous cholecystectomy. No mass or adenopathy. 
MUSCULOSKELETAL: Degenerative changes and diffuse osteopenia.
IMPRESSION: Stable findings adjacent to the left hilum within the posterior aspect left 
upper lobe thought to be treatment related. 
Developing 5 mm nodule right apex requires continued surveillance. The 
nodularity in the right lung base has also changed with the larger nodule stable 
from multiple prior exams and a developing smaller adjacent nodule significance 
uncertain. 
Emphysematous changes, atherosclerotic changes and degenerative changes. 
In patients between the ages of 50-77 where pulmonary emphysema is noted on CT, 
recommend evaluation for low dose lung cancer screening protocol if patient is 
not already enrolled; as pulmonary emphysema is an independent risk factor for 
lung cancer. 
RADIATION DOSE REDUCTION: All CT scans are performed using radiation dose 
reduction techniques, when applicable.  Technical factors are evaluated and 
adjusted to ensure appropriate moderation of exposure.  Automated dose 
management technology is applied to adjust the radiation doses to minimize 
exposure while achieving diagnostic quality images.

## 2023-02-06 ENCOUNTER — Ambulatory Visit: Admit: 2023-02-06 | Discharge: 2023-02-06 | Payer: MEDICARE | Attending: Internal Medicine | Primary: Internal Medicine

## 2023-02-06 DIAGNOSIS — E782 Mixed hyperlipidemia: Secondary | ICD-10-CM

## 2023-02-06 MED ORDER — pravastatin (Pravachol) 40 mg tablet
40 | ORAL_TABLET | Freq: Every evening | ORAL | 3 refills | Status: DC
Start: 2023-02-06 — End: 2023-04-23

## 2023-02-06 MED ORDER — doxycycline (Vibramycin) 100 mg capsule
100 | ORAL_CAPSULE | Freq: Two times a day (BID) | ORAL | 0 refills | Status: AC
Start: 2023-02-06 — End: 2023-02-16

## 2023-02-06 MED ORDER — pantoprazole (ProtoNix) 40 mg EC tablet
40 | ORAL_TABLET | Freq: Every day | ORAL | 3 refills | Status: AC
Start: 2023-02-06 — End: 2024-02-06

## 2023-02-06 MED ORDER — predniSONE (Deltasone) 10 mg tablet
10 | ORAL_TABLET | ORAL | 0 refills | Status: AC
Start: 2023-02-06 — End: 2023-02-16

## 2023-02-06 NOTE — Assessment & Plan Note (Signed)
Referred to oncology to see if CT chest or PET Scan is needed   And if biopsy is warranted

## 2023-02-06 NOTE — Assessment & Plan Note (Signed)
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

## 2023-02-06 NOTE — Assessment & Plan Note (Signed)
This will have to be measure again within next few months on CT  Or cardiac echo

## 2023-02-06 NOTE — Assessment & Plan Note (Signed)
Has been on lorazepam off and on   For many years

## 2023-02-06 NOTE — Assessment & Plan Note (Signed)
 Check lipid panel and liver panel

## 2023-02-06 NOTE — Assessment & Plan Note (Signed)
Check uric acid. 

## 2023-02-06 NOTE — Assessment & Plan Note (Signed)
Check TSh with reflex

## 2023-02-06 NOTE — Progress Notes (Addendum)
[facility]  [facility] [place]  [address]  [place] [address]  Dept: [phone]  Dept Fax: [phone]     Patient ID: Justin Navarro is a 83 y.o. male who presents for Follow-up.    Subjective   HPI    Patient fell in his bathroom in 10-2022   And struck head on toilet   He says there was a 'brain bleed'   But no surgery needed    He was in hospital and then rehab   And then [facility]    He then decided to leave [place] in October   And moved back [place] to [place]    He has hx of multiple falls including twice   [place]      Wants no vaccines of any type    Needs to be referred to oncology   For further evaluation   He says no lung biopsy ever done   But radiation was delivered      He wants no further colonoscopies   In his life       Notes from [facility]:    NP for COPD and hx lung nodules, former Dr Fransico Setters pt   -reviewed former pulm Dr Fransico Setters records. Pt with hx b/l nodules with increasing size left > right found around 07/23/19. PET CT done 10/27/19 reviewed report. It was done at [facility] (this was compared to CT chest from 09/10/19 & previous PET CT 07/2019) & showed decreased metabolic activity in 2 left lung nodules although increased size, No FDG avid med LN suggesting inflammatory vs infection etiology rather than neoplastic. RLL pulm nodule obscured by resp motion artifact but no FDG avid activity around this area. -Pt with hx Bronch/EBUS at [facility] done 12/2019 with surg path LUL and med LN biopsies both neg for malignancy. Bacterial culture positive rare staph aureus. Fungal and AFB negative. -I reviewed personally imaging and report from CT chest wo contrast done at [facility] by Dr Fransico Setters 03/10/20. It showed emphysematous changes, nodule RLL posterior sulcus has improved from 13 mm to 9 mm but new pulm nodule 7 mm above this one, spiculated mass LUL measuring 22 x 13 mm slightly  enlarged tracking towards hilum. Radiologist not sure if previously biopsied and representing malignancy vs inflammation, no LN noted. -Pt stating due for repeat CT chest now. He was previously recommended by previous pulm. I ordered repeat CT chest wo contrast at [facility] to eval previous abnormalities noted. We will call him once results available. -check -check Full PFT -reviewed previous PFT done by Dr Fransico Setters 11/06/19 showed mod stage 2 COPD with FEV1/FVC ratio 69%, FEV1 at 52%, BDR noted, restriction. -cont ppi as before -Pt currently on Symbicort 160/4.5 mcg. He is off Incruse and Daliresp. States he did not find them beneficial. He has hx stiolto and spiriva. Pt with no recent copd flare up. He was provided samples of Breztri to use 2 puffs BID in place of Symbicort to see how does with it. Instructed to rinse mouth after each use. He was taught how to use inhaler in office. We discussed potential side effects. -cont smoking cessation -he declines vaccines. I encourage immunizations -cont nebulizer with Duoneb TID to QID as needed -encourage wt loss and exercise -f/u visit after PFT testing to go over results           Patient Active Problem List  Diagnosis    Testicular hypofunction    Aneurysm of ascending aorta without rupture    Hx of adenomatous colonic polyps    Chronic obstructive pulmonary disease, unspecified (Multi-HCC)    Erectile dysfunction    GERD (gastroesophageal reflux disease)    History of gout    History of malignant neoplasm of skin    Hyperlipidemia    Hypothyroidism    History of oral cancer    Other abnormal glucose    Actinic keratosis    Vitiligo    Chronic anxiety    Mass of upper lobe of left lung    History of bilateral cataract extraction    History of radiation therapy    History of cholecystectomy    History of ankle surgery    History of appendectomy    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 calories with serious comorbidity and body  mass index (BMI) of 32.0 to 32.9 in adult    PAC (premature atrial contraction)    HI (head injury)    Steroid dependence (Multi-HCC)    Vitamin D deficiency    Prediabetes    Marijuana use     Current Outpatient Medications   Medication Instructions    albuterol 90 mcg/actuation inhaler 2 puffs, inhalation, Every 4 hours PRN    celecoxib (CELEBREX) 200 mg, oral, 2 times daily    doxycycline (VIBRAMYCIN) 100 mg, oral, 2 times daily, Take with at least 8 ounces (large glass) of water, do not lie down for 30 minutes after    fluticasone-umeclidin-vilanter (Trelegy Ellipta) 200-62.5-25 mcg blister with device 1 Inhalation, inhalation, Daily    ipratropium-albuteroL (Duo-Neb) 0.5-2.5 mg/3 mL nebulizer solution 3 mL, nebulization, Every 6 hours RT    levothyroxine (SYNTHROID, LEVOXYL) 112 mcg, oral, Daily    LORazepam (ATIVAN) 1 mg, oral, 2 times daily PRN    mirtazapine (REMERON) 15 mg, oral, Nightly    pantoprazole (PROTONIX) 40 mg, oral, Daily before breakfast, Do not crush, chew, or split.    pravastatin (PRAVACHOL) 40 mg, oral, Nightly    predniSONE (Deltasone) 10 mg tablet Take 5 tablets (50 mg) by mouth once daily for 2 days, THEN 4 tablets (40 mg) once daily for 2 days, THEN 3 tablets (30 mg) once daily for 2 days, THEN 2 tablets (20 mg) once daily for 2 days, THEN 1 tablet (10 mg) once daily for 2 days.    predniSONE (DELTASONE) 10 mg, oral, Daily    tamsulosin (FLOMAX) 0.4 mg, oral, Daily     Allergies   Allergen Reactions    Belsomra [Suvorexant] Other    Iodinated Contrast Media Hives    Penicillin G Hives     No past medical history on file.  No past surgical history on file.  No family history on file.  Social History     Socioeconomic History    Marital status: Divorced     Spouse name: Not on file    Number of children: Not on file    Years of education: Not on file    Highest education level: Not on file   Occupational History    Not on file   Tobacco Use    Smoking status: Not on file    Smokeless  tobacco: Not on file   Substance and Sexual Activity    Alcohol use: Not on file    Drug use: Yes     Types: Marijuana  Sexual activity: Not on file   Other Topics Concern    Not on file   Social History Narrative    Not on file     Social Determinants of Health     Financial Resource Strain: Not on file   Food Insecurity: Not on file   Transportation Needs: Not on file   Physical Activity: Not on file   Stress: Not on file   Social Connections: Not on file   Intimate Partner Violence: Not on file   Housing Stability: Not on file     Review of Systems  Medications Discontinued During This Encounter   Medication Reason    Symbicort 160-4.5 mcg/actuation inhaler     pantoprazole (ProtoNix) 40 mg EC tablet Reorder    pravastatin (Pravachol) 40 mg tablet Reorder     Outpatient Medications Prior to Visit   Medication Sig Dispense Refill    albuterol 90 mcg/actuation inhaler Inhale 2 puffs every 4 (four) hours if needed for wheezing.      celecoxib (CeleBREX) 200 mg capsule Take 200 mg by mouth twice daily.      fluticasone-umeclidin-vilanter (Trelegy Ellipta) 200-62.5-25 mcg blister with device Inhale 1 Inhalation once daily.      ipratropium-albuteroL (Duo-Neb) 0.5-2.5 mg/3 mL nebulizer solution Take 3 mL by nebulization every 6 (six) hours.      levothyroxine (Synthroid, Levoxyl) 112 mcg tablet Take 1 tablet (112 mcg) by mouth in the morning. 90 tablet 3    LORazepam (Ativan) 1 mg tablet Take 1 tablet (1 mg) by mouth if needed in the morning and at bedtime for anxiety. 60 tablet 1    mirtazapine (Remeron) 15 mg tablet Take 15 mg by mouth at bedtime.      predniSONE (Deltasone) 10 mg tablet Take 10 mg by mouth once daily.      tamsulosin (Flomax) 0.4 mg 24 hr capsule Take 0.4 mg by mouth once daily.      pantoprazole (ProtoNix) 40 mg EC tablet TAKE ONE TABLET BY MOUTH TWO TIMES DAILY FOR SWALLOWING 60 tablet 0    pravastatin (Pravachol) 40 mg tablet TAKE 1 TABLET AT BEDTIME   (RE-CHECK CHOLESTEROL IN 2 MONTHS) 90  tablet 3    Symbicort 160-4.5 mcg/actuation inhaler USE 2 INHALATIONS ORALLY   TWICE DAILY, RINSE MOUTH   WITH WATER AFTER USE 30.6 g 3       Objective   Visit Vitals  BP 124/64 (BP Location: Left arm, Patient Position: Sitting, BP Cuff Size: Large adult)   Pulse 78   Temp 36.1 C (96.9 F) (Temporal)   Ht 1.803 m   Wt 105.7 kg   SpO2 96%   BMI 32.50 kg/m   BSA 2.3 m       Physical Exam  Vitals reviewed.   Constitutional:       Appearance: Normal appearance. He is obese.   HENT:      Head: Normocephalic and atraumatic.      Right Ear: External ear normal.      Left Ear: Ear canal and external ear normal.      Nose: Nose normal.      Mouth/Throat:      Mouth: Mucous membranes are moist.      Pharynx: Oropharynx is clear.   Eyes:      Extraocular Movements: Extraocular movements intact.      Conjunctiva/sclera: Conjunctivae normal.      Pupils: Pupils are equal, round, and reactive to light.  Comments: History of bilateral cataract extractions   Cardiovascular:      Rate and Rhythm: Normal rate and regular rhythm.      Pulses: Normal pulses.      Heart sounds: Normal heart sounds.   Pulmonary:      Effort: Pulmonary effort is normal.      Breath sounds: Rhonchi present.      Comments: COPD well established  Abdominal:      General: Abdomen is flat. Bowel sounds are normal.   Musculoskeletal:         General: Normal range of motion.      Cervical back: Normal range of motion and neck supple.   Skin:     General: Skin is warm and dry.   Neurological:      General: No focal deficit present.      Mental Status: He is alert and oriented to person, place, and time. Mental status is at baseline.   Psychiatric:         Mood and Affect: Mood normal.         Thought Content: Thought content normal.         Judgment: Judgment normal.         Assessment/Plan   Ryver was seen today for follow-up.  Mixed hyperlipidemia  Assessment & Plan:  Check lipid panel and liver panel    Orders:  -     CBC and differential  -     TSH  with reflex  -     Comprehensive metabolic panel  -     Hemoglobin A1c  -     Albumin, Urine, Random (Microalbumin Random, Including Creatinine)  -     Urinalysis  -     Vit D 25 hydroxy  -     Lipid panel  -     PSA Total, Screen  -     Uric acid  Chronic insomnia  Acquired hypothyroidism  Assessment & Plan:  Check TSh with reflex  Orders:  -     CBC and differential  -     TSH with reflex  -     Comprehensive metabolic panel  -     Hemoglobin A1c  -     Albumin, Urine, Random (Microalbumin Random, Including Creatinine)  -     Urinalysis  -     Vit D 25 hydroxy  -     Lipid panel  -     PSA Total, Screen  -     Uric acid  Chronic prescription benzodiazepine use  -     ToxAssure Flex 15, Urine  Abnormal glucose  -     Hemoglobin A1c  Vitamin D deficiency  -     Vit D 25 hydroxy  Prostate cancer screening  -     PSA Total, Screen  Gastroesophageal reflux disease, unspecified whether esophagitis present  -     pantoprazole (ProtoNix) 40 mg EC tablet; Take 1 tablet (40 mg) by mouth before breakfast. Do not crush, chew, or split.  Hyperlipidemia, unspecified hyperlipidemia type  Assessment & Plan:  Check lipid panel and liver panel    Orders:  -     pravastatin (Pravachol) 40 mg tablet; Take 1 tablet (40 mg) by mouth at bedtime.  Mass of upper lobe of left lung  Assessment & Plan:  Referred to oncology to see if CT chest or PET Scan is needed   And if biopsy is warranted  Aneurysm  of ascending aorta without rupture  Assessment & Plan:   This will have to be measure again within next few months on CT  Or cardiac echo  Chronic anxiety  Assessment & Plan:   Has been on lorazepam off and on   For many years  History of gout  Assessment & Plan:  Check uric acid  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 [I25.10]  COPD exacerbation (Multi-HCC)  -     SARS/FLU/RSV  -     Strep A culture, throat  Centrilobular emphysema (Multi-HCC)  Assessment & Plan:   Exacerbation   Steroid dependent   Rx today doxy and prednisone  taper  Orders:  -     doxycycline (Vibramycin) 100 mg capsule; Take 1 capsule (100 mg) by mouth twice daily for 10 days. Take with at least 8 ounces (large glass) of water, do not lie down for 30 minutes after  -     predniSONE (Deltasone) 10 mg tablet; Take 5 tablets (50 mg) by mouth once daily for 2 days, THEN 4 tablets (40 mg) once daily for 2 days, THEN 3 tablets (30 mg) once daily for 2 days, THEN 2 tablets (20 mg) once daily for 2 days, THEN 1 tablet (10 mg) once daily for 2 days.  Other orders  -     Urine Microscopic  -     Cannabinoids, MS, Ur          Visit was 1 hour or more         EKG      Sinus rhythm with PACs       Axis  41 degrees    PR  .126    QRS  .096    QT  .402    Stable

## 2023-02-07 LAB — URINALYSIS
Bilirubin Ur: NEGATIVE
Glucose Ur: NEGATIVE
Nitrite Ur: NEGATIVE
Occult Blood Ur: NEGATIVE
Specific Gravity Ur: >=1.03 — AB (ref 1.005–1.030)
Urobilinogen,Semi-Qn Ur: 1 mg/dL (ref 0.2–1.0)
pH Ur: 6 (ref 5.0–7.5)

## 2023-02-07 LAB — CBC W/DIFF
Baso Abs: 0 10*3/uL (ref 0.0–0.2)
Basos: 0 %
Eos Abs: 0 10*3/uL (ref 0.0–0.4)
Eos: 1 %
Hct: 39.3 % (ref 37.5–51.0)
Hgb: 12.7 g/dL — ABNORMAL LOW (ref 13.0–17.7)
Immature Grans Abs: 0 10*3/uL (ref 0.0–0.1)
Immature Granulocytes: 0 %
Lymphs Abs: 0.8 10*3/uL (ref 0.7–3.1)
Lymphs: 14 %
MCH: 26.6 pg (ref 26.6–33.0)
MCHC: 32.3 g/dL (ref 31.5–35.7)
MCV: 82 fL (ref 79–97)
Monocytes: 5 %
MonocytesAbs: 0.3 10*3/uL (ref 0.1–0.9)
Neutrophils Abs: 4.6 10*3/uL (ref 1.4–7.0)
Neutrophils: 80 %
Platelets: 219 10*3/uL (ref 150–450)
RBC: 4.77 x10E6/uL (ref 4.14–5.80)
RDW: 13.4 % (ref 11.6–15.4)
WBC: 5.8 10*3/uL (ref 3.4–10.8)

## 2023-02-07 LAB — URIC ACID: Uric Acid: 4.8 mg/dL (ref 3.8–8.4)

## 2023-02-07 LAB — VITAMIN D 25 HYDROXY: Vitamin D, 25-Hydroxy: 24.6 ng/mL — ABNORMAL LOW (ref 30.0–100.0)

## 2023-02-07 LAB — URINE MICROSCOPIC (REFLEX ONLY)
Bacteria Ur: NONE SEEN
Casts Ur: NONE SEEN /LPF

## 2023-02-07 LAB — LIPID PANEL
Cholesterol: 145 mg/dL (ref 100–199)
HDL Cholesterol: 53 mg/dL (ref >39)
LDLc Calc (NIH): 80 mg/dL (ref 0–99)
Non-HDL Chol: 92 mg/dL (ref 0–129)
Triglycerides: 59 mg/dL (ref 0–149)
VLDLc Calc: 12 mg/dL (ref 5–40)

## 2023-02-07 LAB — COMPREHENSIVE METABOLIC PANEL
ALT: 11 IU/L (ref 0–44)
AST: 16 IU/L (ref 0–40)
Albumin: 3.8 g/dL (ref 3.7–4.7)
Alk Phosphatase: 76 IU/L (ref 44–121)
Anion Gap: 13 mmol/L (ref 10.0–18.0)
BUN/Creat Ratio: 14 (ref 10–24)
BUN: 13 mg/dL (ref 8–27)
Bili Total: 0.4 mg/dL (ref 0.0–1.2)
Calcium: 9 mg/dL (ref 8.6–10.2)
Carbon Dioxide: 23 mmol/L (ref 20–29)
Chloride: 104 mmol/L (ref 96–106)
Creat: 0.94 mg/dL (ref 0.76–1.27)
Globulin Total: 2.5 g/dL (ref 1.5–4.5)
Glucose: 122 mg/dL — ABNORMAL HIGH (ref 70–99)
Potassium: 4.8 mmol/L (ref 3.5–5.2)
Protein Total: 6.3 g/dL (ref 6.0–8.5)
Sodium: 140 mmol/L (ref 134–144)
eGFR: 81 mL/min/{1.73_m2} (ref >59)

## 2023-02-07 LAB — PSA TOTAL, SCREEN: PSA TOTAL: 0.6 ng/mL (ref 0.0–4.0)

## 2023-02-07 LAB — TSH WITH REFLEX: TSH: 1.16 u[IU]/mL (ref 0.450–4.500)

## 2023-02-07 LAB — HEMOGLOBIN A1C: HgbA1C: 6.1 % — ABNORMAL HIGH (ref 4.8–5.6)

## 2023-02-08 LAB — SARS/FLU/RSV
Influenza A, NAA: NOT DETECTED
Influenza B, NAA: NOT DETECTED
RSV, NAA: NOT DETECTED
SARS-CoV-2, NAA: NOT DETECTED

## 2023-02-08 LAB — ALBUMIN, URINE, RANDOM (INCLUDING CREATININE)
Alb/Creat Ratio: 5 mg/g{creat} (ref 0–29)
Albumin Ur: 11.5 ug/mL
Creat Ur: 246.8 mg/dL

## 2023-02-09 LAB — TOXASSURE FLEX 15, URINE
6-Acetylmorphine IA, Ur: NEGATIVE ng/mL
7-Aminoclonazepam, Ur: NOT DETECTED ng/mg{creat}
Alpha-Hydroxyalprazolam, Ur: NOT DETECTED ng/mg{creat}
Alpha-Hydroxymidazolam, Ur: NOT DETECTED ng/mg{creat}
Alpha-Hydroxytriazolam, Ur: NOT DETECTED ng/mg{creat}
Alprazolam, Ur: NOT DETECTED ng/mg{creat}
Amphetamines IA, UR: NEGATIVE ng/mL
Barbiturates IA, Ur: NEGATIVE ng/mL
Benzodiazepines, Ur: POSITIVE
Buprenorphine, Ur: NEGATIVE
Buprenorphine, Ur: NOT DETECTED ng/mg{creat}
Clonazepam, Ur: NOT DETECTED ng/mg{creat}
Cocaine Metabolite IA, Ur: NEGATIVE ng/mL
Creatinine, Urine: 231 mg/dL
Desalkylflurazepam, Ur: NOT DETECTED ng/mg{creat}
Desmethyldiazepam, Ur: NOT DETECTED ng/mg{creat}
Desmethylflunitrazepam, Ur: NOT DETECTED ng/mg{creat}
Diazepam, Ur: NOT DETECTED ng/mg{creat}
Ethyl Alcohol Enzymatic, Ur: NEGATIVE g/dL
Fentanyl, Ur: NEGATIVE
Fentanyl, Ur: NOT DETECTED ng/mg{creat}
Flunitrazepam, Ur: NOT DETECTED ng/mg{creat}
Lorazepam, Ur: 271 ng/mg{creat}
Methadone IA, Ur: NEGATIVE ng/mL
Methadone Mtb IA, Ur: NEGATIVE ng/mL
Midazolam, Ur: NOT DETECTED ng/mg{creat}
Norbuprenorphine, Ur: NOT DETECTED ng/mg{creat}
Norfentanyl, Ur: NOT DETECTED ng/mg{creat}
Opiate Class IA: NEGATIVE ng/mL
Oxazepam, Ur: NOT DETECTED ng/mg{creat}
Oxycodone Class IA, Ur: NEGATIVE ng/mL
Phencyclidine IA, Ur: NEGATIVE ng/mL
Tapentadol, IA, Ur: NEGATIVE ng/mL
Temazepam, Ur: NOT DETECTED ng/mg{creat}
Tramadol IA, Ur: NEGATIVE ng/mL

## 2023-02-09 LAB — STREP A CULTURE, THROAT: Beta Strep Gp A Culture: NEGATIVE

## 2023-02-13 NOTE — Telephone Encounter (Signed)
New patient referral to Dr. Nira Retort for history of lung mass, 2021-referring provider is Dr. Dagoberto Reef. Of note, patient was previously followed by Dr. Marcheta Grammes established care for COPD, lung nodules in  07/2019 Bronch/EBUS at [facility] done 12/6//2021 with Dr. Gay Filler- Pathology demonstrated LUL bronchial brushings and Mediastinal LN FNA Biopsy both negative for malignancy.  Patient was living in [place] and recently relocated back to [place]. He was followed by pulmonary, reported having radiation to Left lung while in [place], he is being referred for further evaluation and management. I spoke with patient-he stated he does have Radiation oncology records with him. Case reviewed, patient will see Dr. Nira Retort on Tuesday 02/27/23 at 11:30 am, appointment ate/time and location provided.

## 2023-02-27 ENCOUNTER — Ambulatory Visit
Admit: 2023-02-27 | Discharge: 2023-02-27 | Payer: MEDICARE | Attending: Hematology & Oncology | Primary: Internal Medicine

## 2023-02-27 DIAGNOSIS — C3492 Malignant neoplasm of unspecified part of left bronchus or lung: Secondary | ICD-10-CM

## 2023-02-27 NOTE — Progress Notes (Signed)
Cancer Center Follow Up Visit Note -  Adult      MRN: [id number] Patient Name: Justin Navarro     DOB:  [birth year] Gender: male    AGE:   83 y.o. Encounter Date: 02/27/2023     Diagnosis:  1. 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 History of radiation therapy    3. History of cigarette smoking    4. History of malignant neoplasm of oral cavity         Cancer Staging   No matching staging information was found for the patient.      Primary Language:   English    Interval History:  Justin Navarro is a 83 y.o. male who comes to the clinic for initial evaluation. The patient has previously been treated in [place]. In October he decided to leave [place] and moved back to [place].  In September 2024 the patient sustained a fall and struck his head on the toilet.  He was diagnosed with an intracranial bleed.  He was hospitalized in was discharged to rehab thereafter.    Patient recently completed a course of antibiotic therapy because of a productive cough.  He was also placed on a prednisone taper On 02/06/2023 and was also given doxycycline 100 mg twice daily for 10 days. There are no symptoms of respiratory distress at this time.  The patient has an intermittent cough which he states is chronic in nature.  There are no complaints of pain.  Summary of Past Treatment:      There is a history of pulmonary nodules for which the patient was previously evaluated at [facility] with a bronchoscopy/EBUS in December 2021.  At that time the patient had a biopsy of a  left upper lobe lung nodule which was negative for malignancy.  In the fall of 2023 he had further evaluation while in [place] and  underwent a PET scan on 12/02/2021 which showed a centrally located left upper lobe mass at the level of the superior left hilum.  This mass was enlarged compared to a CT scan of the chest done  on 12/08/2020.  There was no evidence of lymphadenopathy or distant metastases.   On 01/02/2022 the patient had a  bronchoscopy and biopsy of the left upper lobe lung mass with pathology negative for malignancy.  A follow-up CT scan in March 2024 again showed a left upper lobe mass like opacity measuring 4 x 2.1 cm.  A repeat CT scan on August 15, 2022 showed a 3.3 x 2 cm spiculated nodule in the left upper lobe which was slightly enlarged from the previous CT.  In addition there was a 4 mm and a 6 mm nodule in the right lower lobe.  The patient underwent bronchoscopy with biopsy of the left upper lobe lung mass on 08/21/2022 and pathology was positive for squamous cell carcinoma.  The patient underwent a PET scan on 09/19/2022 which showed abnormal uptake in the left upper lobe mass near the hilum.  Subcentimeter nodules in the right lower lobe did not show FDG uptake. There was no evidence of additional FDG uptake to suggest metastatic disease.  The patient declined chemotherapy and was offered stereotactic radiotherapy to the left upper lobe mass.    Allergies   Allergen Reactions    Belsomra [Suvorexant] Other    Iodinated Contrast Media Hives    Penicillin G Hives       Current Outpatient Medications   Medication  Instructions    albuterol 90 mcg/actuation inhaler 2 puffs, inhalation, Every 4 hours PRN    celecoxib (CELEBREX) 200 mg, oral, 2 times daily    fluticasone-umeclidin-vilanter (Trelegy Ellipta) 200-62.5-25 mcg blister with device 1 Inhalation, inhalation, Daily    ipratropium-albuteroL (Duo-Neb) 0.5-2.5 mg/3 mL nebulizer solution 3 mL, nebulization, Every 6 hours RT    levothyroxine (SYNTHROID, LEVOXYL) 112 mcg, oral, Daily    LORazepam (ATIVAN) 1 mg, oral, 2 times daily PRN    mirtazapine (REMERON) 15 mg, oral, Nightly    pantoprazole (PROTONIX) 40 mg, oral, Daily before breakfast, Do not crush, chew, or split.    pravastatin (PRAVACHOL) 40 mg, oral, Nightly    predniSONE (DELTASONE) 10 mg, oral, Daily    tamsulosin (FLOMAX) 0.4 mg, oral, Daily       No past medical history on file.    Patient Active Problem List    Diagnosis    Testicular hypofunction    Aneurysm of ascending aorta without rupture    Hx of adenomatous colonic polyps    Chronic obstructive pulmonary disease, unspecified (Multi-HCC)    Erectile dysfunction    GERD (gastroesophageal reflux disease)    History of gout    History of malignant neoplasm of skin    Hyperlipidemia    Hypothyroidism    History of oral cancer    Other abnormal glucose    Actinic keratosis    Vitiligo    Chronic anxiety    Mass of upper lobe of left lung    History of bilateral cataract extraction    History of radiation therapy    History of cholecystectomy    History of ankle surgery    History of appendectomy    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 calories with serious comorbidity and body mass index (BMI) of 32.0 to 32.9 in adult    PAC (premature atrial contraction)    HI (head injury)    Steroid dependence (Multi-HCC)    Vitamin D deficiency    Prediabetes    Marijuana use       No past surgical history on file.    No family history on file.    Social History     Tobacco Use    Smoking status: Former     Types: Cigarettes    Smokeless tobacco: Never   Substance Use Topics    Alcohol use: Not Currently    Drug use: Yes     Types: Marijuana       Review of Systems   Constitutional:  Negative for appetite change and unexpected weight change.   HENT:   Negative for sore throat and trouble swallowing.    Respiratory:  Negative for cough and shortness of breath.    Cardiovascular:  Negative for leg swelling.   Gastrointestinal:  Negative for abdominal pain, nausea and vomiting.   Genitourinary:  Negative for difficulty urinating and dysuria.    Musculoskeletal:  Negative for back pain and neck pain.   Skin:  Negative for rash.   Neurological:  Negative for dizziness and headaches.   Hematological:  Does not bruise/bleed easily.        Vitals:    02/27/23 1129   BP: 125/77   BP Location: Left arm   Patient Position: Sitting   Pulse: 79   Resp:  20   Temp: 36.4 C (97.5 F)   TempSrc: Temporal   SpO2: (!) 94%  Weight: 105.9 kg   PainSc: 0-No pain          10/09/2019    11:03 AM 12/12/2019     9:58 AM 01/05/2020    12:00 AM 01/05/2020     9:28 AM 04/21/2020     3:10 PM 02/06/2023     2:25 PM 02/27/2023    11:29 AM   Vitals   Systolic 137 142  115 126 124 161   Diastolic 72 79  91 64 64 77   Pulse 95 87  89 72 78 79   Temp    36.7 C (98.1 F)  36.1 C (96.9 F) 36.4 C (97.5 F)   Resp       20   Height (in) 1.793 m 1.793 m 1.829 m  1.793 m 1.803 m    Weight (lb)   249.98   233 233.5   SpO2    95 %  96 % 94 %   BMI 35.26 kg/m2 35.26 kg/m2 33.9 kg/m2  35.26 kg/m2 32.5 kg/m2 32.57 kg/m2   BSA (m2) 2.38 m2 2.38 m2 2.4 m2  2.38 m2 2.3 m2 2.3 m2   Visit Report      Report Report         Last Documented Distress Screening Result:  Patient Distress Level: 0 No Distress    Performance Status:  0 - Asymptomatic    Physical Exam  Constitutional:       Appearance: Normal appearance. He is normal weight.   HENT:      Head: Normocephalic.      Nose: Nose normal.   Eyes:      Extraocular Movements: Extraocular movements intact.      Conjunctiva/sclera: Conjunctivae normal.      Pupils: Pupils are equal, round, and reactive to light.   Cardiovascular:      Rate and Rhythm: Normal rate and regular rhythm.      Heart sounds: Normal heart sounds.   Pulmonary:      Effort: Pulmonary effort is normal.      Breath sounds: Normal breath sounds.   Abdominal:      General: Abdomen is flat.      Palpations: Abdomen is soft.      Comments: No hepatomegaly, no splenomegaly   Musculoskeletal:         General: Normal range of motion.   Lymphadenopathy:      Cervical: No cervical adenopathy.   Skin:     Findings: No lesion or rash.   Neurological:      General: No focal deficit present.      Mental Status: He is oriented to person, place, and time.   Psychiatric:         Mood and Affect: Mood normal.         Lab Tests Reviewed:      No results found for this or any previous visit (from the past  24 hour(s)).  Recent Results (from the past 672 hour(s))   CBC and differential    Collection Time: 02/06/23  3:11 PM   Result Value Ref Range    WBC 5.8 3.4 - 10.8 x10E3/uL    RBC 4.77 4.14 - 5.80 x10E6/uL    Hgb 12.7 (L) 13.0 - 17.7 g/dL    Hct 09.6 04.5 - 40.9 %    MCV 82 79 - 97 fL    MCH 26.6 26.6 - 33.0 pg    MCHC 32.3 31.5 - 35.7 g/dL  RDW 13.4 11.6 - 15.4 %    Platelets 219 150 - 450 x10E3/uL    Neutrophils 80 Not Estab. %    Lymphs 14 Not Estab. %    Monocytes 5 Not Estab. %    Eos 1 Not Estab. %    Basos 0 Not Estab. %    Neutrophils Abs 4.6 1.4 - 7.0 x10E3/uL    Lymphs Abs 0.8 0.7 - 3.1 x10E3/uL    MonocytesAbs 0.3 0.1 - 0.9 x10E3/uL    Eos Abs 0.0 0.0 - 0.4 x10E3/uL    Baso Abs 0.0 0.0 - 0.2 x10E3/uL    Immature Granulocytes 0 Not Estab. %    Immature Grans Abs 0.0 0.0 - 0.1 x10E3/uL   TSH with reflex    Collection Time: 02/06/23  3:11 PM   Result Value Ref Range    TSH 1.160 0.450 - 4.500 uIU/mL   Comprehensive metabolic panel    Collection Time: 02/06/23  3:11 PM   Result Value Ref Range    Glucose 122 (H) 70 - 99 mg/dL    BUN 13 8 - 27 mg/dL    Creat 4.69 6.29 - 5.28 mg/dL    eGFR 81 >41 LK/GMW/1.02    BUN/Creat Ratio 14 10 - 24    Sodium 140 134 - 144 mmol/L    Potassium 4.8 3.5 - 5.2 mmol/L    Chloride 104 96 - 106 mmol/L    Anion Gap 13.0 10.0 - 18.0 mmol/L    Carbon Dioxide 23 20 - 29 mmol/L    Calcium 9.0 8.6 - 10.2 mg/dL    Protein Total 6.3 6.0 - 8.5 g/dL    Albumin 3.8 3.7 - 4.7 g/dL    Globulin Total 2.5 1.5 - 4.5 g/dL    Bili Total 0.4 0.0 - 1.2 mg/dL    Alk Phosphatase 76 44 - 121 IU/L    AST 16 0 - 40 IU/L    ALT 11 0 - 44 IU/L   Hemoglobin A1c    Collection Time: 02/06/23  3:11 PM   Result Value Ref Range    HgbA1C 6.1 (H) 4.8 - 5.6 %   Vit D 25 hydroxy    Collection Time: 02/06/23  3:11 PM   Result Value Ref Range    Vitamin D, 25-Hydroxy 24.6 (L) 30.0 - 100.0 ng/mL   Lipid panel    Collection Time: 02/06/23  3:11 PM   Result Value Ref Range    Cholesterol 145 100 - 199 mg/dL     Triglycerides 59 0 - 149 mg/dL    HDL Cholesterol 53 >72 mg/dL    VLDLc Calc 12 5 - 40 mg/dL    LDLc Calc (NIH) 80 0 - 99 mg/dL    Non-HDL Chol 92 0 - 129 mg/dL   PSA Total, Screen    Collection Time: 02/06/23  3:11 PM   Result Value Ref Range    PSA TOTAL 0.6 0.0 - 4.0 ng/mL   Uric acid    Collection Time: 02/06/23  3:11 PM   Result Value Ref Range    Uric Acid 4.8 3.8 - 8.4 mg/dL   Albumin, Urine, Random (Microalbumin Random, Including Creatinine)    Collection Time: 02/06/23  3:12 PM   Result Value Ref Range    Creat Ur 246.8 Not Estab. mg/dL    Albumin Ur 53.6 Not Estab. ug/mL    Alb/Creat Ratio 5 0 - 29 mg/g creat   Urinalysis    Collection Time: 02/06/23  3:12 PM   Result Value Ref Range    Specific Gravity Ur      >=1.030 (A) 1.005 - 1.030    pH Ur 6.0 5.0 - 7.5    Color Ur Yellow Yellow    Appearance Ur Clear Clear    WBC Esterase Ur 1+ (A) Negative    Protein Ur 1+ (A) Negative/Trace    Glucose Ur Negative Negative    Ketones Ur Trace (A) Negative    Occult Blood Ur Negative Negative    Bilirubin Ur Negative Negative    Urobilinogen,Semi-Qn Ur 1.0 0.2 - 1.0 mg/dL    Nitrite Ur Negative Negative    Ur Microscopic See below:    ToxAssure Flex 15, Urine    Collection Time: 02/06/23  3:12 PM   Result Value Ref Range    EXT LC SUM RPT UR FINAL     Creatinine, Urine 231 mg/dL    Amphetamines IA, UR Negative CUTOFF:300 ng/mL    Benzodiazepines, Ur +POSITIVE+     Diazepam, Ur Not Detected ng/mg creat    Desmethyldiazepam, Ur Not Detected ng/mg creat    Oxazepam, Ur Not Detected ng/mg creat    Temazepam, Ur Not Detected ng/mg creat    Alprazolam, Ur Not Detected ng/mg creat    Alpha-Hydroxyalprazolam, Ur Not Detected ng/mg creat    Desalkylflurazepam, Ur Not Detected ng/mg creat    Lorazepam, Ur 271 ng/mg creat    Alpha-Hydroxytriazolam, Ur Not Detected ng/mg creat    Clonazepam, Ur Not Detected ng/mg creat    7-Aminoclonazepam, Ur Not Detected ng/mg creat    Midazolam, Ur Not Detected ng/mg creat     Alpha-Hydroxymidazolam, Ur Not Detected ng/mg creat    Flunitrazepam, Ur Not Detected ng/mg creat    Desmethylflunitrazepam, Ur Not Detected ng/mg creat    Cocaine Metabolite IA, Ur Negative CUTOFF:150 ng/mL    Ethyl Alcohol Enzymatic, Ur Negative CUTOFF:0.020 g/dL    Cannabinoids IA, Ur  CUTOFF:20 ng/mL    6-Acetylmorphine IA, Ur Negative CUTOFF:10 ng/mL    Opiate Class IA Negative CUTOFF:100 ng/mL    Oxycodone Class IA, Ur Negative CUTOFF:100 ng/mL    Methadone IA, Ur Negative CUTOFF:100 ng/mL    Methadone Mtb IA, Ur Negative CUTOFF:100 ng/mL    Buprenorphine, Ur Negative     Buprenorphine, Ur Not Detected ng/mg creat    Norbuprenorphine, Ur Not Detected ng/mg creat    Fentanyl, Ur Negative     Fentanyl, Ur Not Detected ng/mg creat    Norfentanyl, Ur Not Detected ng/mg creat    Tapentadol, IA, Ur Negative CUTOFF:200 ng/mL    Tramadol IA, Ur Negative CUTOFF:200 ng/mL    Barbiturates IA, Ur Negative CUTOFF:200 ng/mL    Phencyclidine IA, Ur Negative CUTOFF:25 ng/mL   Urine Microscopic    Collection Time: 02/06/23  3:12 PM   Result Value Ref Range    WBC Ur 6-10 (A) 0 - 5 /hpf    RBC Ur 0-2 0 - 2 /hpf    Epithelial Cells (non renal) Ur 0-10 0 - 10 /hpf    Casts Ur None seen None seen /lpf    Bacteria Ur None seen None seen/Few   Cannabinoids, MS, Ur    Collection Time: 02/06/23  3:12 PM   Result Value Ref Range    Cannabinoids Confirm, Ur +POSITIVE+     Carboxy-THC, Ur 263 ng/mg creat   SARS/FLU/RSV    Collection Time: 02/06/23  3:29 PM    Specimen: Nasopharyngeal; Mucosa    Nasopharynge  Result Value Ref Range    SARS-CoV-2, NAA Not Detected Not Detected    Influenza A, NAA Not Detected Not Detected    Influenza B, NAA Not Detected Not Detected    RSV, NAA Not Detected Not Detected    Test Information:     Strep A culture, throat    Collection Time: 02/06/23  3:29 PM    Specimen: Throat; Mucosa    Throat   Result Value Ref Range    Beta Strep Gp A Culture Negative        Imaging Reviewed:      Pathology  Reviewed:  No results found for this or any previous visit.    Assessment & Plan:  1. 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 History of radiation therapy    3. History of cigarette smoking    4. History of malignant neoplasm of oral cavity      Gerod is an 83 year old male with a history of squamous cell carcinoma of the left upper lobe who has had previous treatment with SBRT in [place].  The patient tolerated radiation and had a follow-up non-contrast chest  CT scan in October 2024 while still living in [place]. There is a history of IV contrast allergy. Patient has moved to [place] from [place].  He is seen today in the clinic for initial visit.  I have reviewed medical records from the treating physicians in [place]. The patient looks well at this time. The patient has had symptoms of a chronic cough and he recently completed a course of doxycycline and prednisone.  Laboratory studies done on 02/05/2022 were reviewed.  CBC and chemistry profile are unremarkable.  The patient will be scheduled for a chest CT scan.  There are no clinical signs or symptoms to suggest metastatic disease.  He will need ongoing monitoring of his chest CT scan.    Total time spent on today's visit was 45 minutes in  reviewing medical records, performing a history and physical, counseling the patient and coordinating follow-up care. All questions were answered to the best of my ability.

## 2023-03-09 ENCOUNTER — Inpatient Hospital Stay: Admit: 2023-03-09 | Payer: MEDICARE | Primary: Internal Medicine

## 2023-03-09 DIAGNOSIS — C3492 Malignant neoplasm of unspecified part of left bronchus or lung: Secondary | ICD-10-CM

## 2023-03-15 NOTE — Telephone Encounter (Signed)
Patient with history of squamous cell carcinoma of the left upper lobe who has had previous treatment with SBRT in [place].  The patient tolerated radiation and had a follow-up non-contrast chest  CT scan in October 2024 while still living in [place]. The patient will be scheduled for a chest CT scan.  There are no clinical signs or symptoms to suggest metastatic disease.  He will need ongoing monitoring of his chest CT scan.    Renee obtained CT scan from [place] - Dr. Nira Retort compared CT (chest) scan from [place] to CT (chest) scan done on 03/09/23    Per comparison between CT scan in October 2024 to CT scan in February 2025:   -no significant change  -slight increase noted on nodule in right lower lobe of lung (site was not treated)   -plan to repeat scan in 3 months: date???    Called and spoke to patient - patient verbalized understanding

## 2023-03-16 NOTE — Telephone Encounter (Signed)
Patient notified of appointments and a copy is mailed to his home.        CT Chest wo contrast  Thursday May 8 @ 10:15 am  [facility]    Labs 06/04/23 @ 3:00 pm  Follow up 06/13/23 @ 2:30 pm with Dr. Nira Retort          Hold for results

## 2023-03-20 ENCOUNTER — Encounter: Primary: Internal Medicine

## 2023-03-28 NOTE — Telephone Encounter (Signed)
 Department Name:     Patient: Drayton Tieu  MRN: [id number]  Agent: Chiquita Loth    [facility] Scheduling Message    Patient called to request a refill of:  Lorazepam 1 mg  Levothyroxine 112 mcg  Mirtazapine 15 mg    Out of medications     Pharmacy:  [facility] [address] [place]. [place].    Informed patient it can take 3 business days to send prescription refill to pharmacy.    Call back number: [phone]

## 2023-03-28 NOTE — Telephone Encounter (Signed)
 Last OV with PCP: 02/06/2023    Next OV with PCP: 04/12/2023    Last HGBA1C:   HgbA1C (%)   Date Value   02/06/2023 6.1 (H)       Last BP:   BP Readings from Last 3 Encounters:   02/27/23 125/77   02/06/23 124/64   04/21/20 126/64       TSH:   TSH   Date Value Ref Range Status   02/06/2023 1.160 0.450 - 4.500 uIU/mL Final     Comment:     No apparent thyroid disorder. Additional testing not indicated. In  rare instances, Secondary Hypothyroidism as well as Subclinical  Hypothyroidism have been reported in some patients with normal TSH  values.         Last TOX screen (if applicable):    Labs    Lab Results   Component Value Date    CALCIUM 9.0 02/06/2023    ALBUMIN 3.8 02/06/2023    NA 140 02/06/2023    K 4.8 02/06/2023    CO2 23 02/06/2023    CL 104 02/06/2023    BUN 13 02/06/2023    CREATININE 0.94 02/06/2023      AST   Date Value Ref Range Status   02/06/2023 16 0 - 40 IU/L Final     ALT   Date Value Ref Range Status   02/06/2023 11 0 - 44 IU/L Final     No components found for: "TBILI"  Hgb   Date Value Ref Range Status   02/06/2023 12.7 (L) 13.0 - 17.7 g/dL Final     Hct   Date Value Ref Range Status   02/06/2023 39.3 37.5 - 51.0 % Final     Platelets   Date Value Ref Range Status   02/06/2023 219 150 - 450 x10E3/uL Final     No results found for: "PTT"  No results found for: "INR"  No results found for: "PROTIME"  No results found for: "PTADJUSTED"

## 2023-03-29 MED ORDER — mirtazapine (Remeron) 15 mg tablet
15 | ORAL_TABLET | Freq: Every evening | ORAL | 3 refills | 28.00000 days | Status: AC
Start: 2023-03-29 — End: ?

## 2023-03-29 MED ORDER — levothyroxine (Synthroid, Levoxyl) 112 mcg tablet
112 | ORAL_TABLET | Freq: Every day | ORAL | 3 refills | 90.00000 days | Status: AC
Start: 2023-03-29 — End: 2024-03-28

## 2023-03-29 MED ORDER — LORazepam (Ativan) 1 mg tablet
1 | ORAL_TABLET | Freq: Two times a day (BID) | ORAL | 0 refills | 15.00000 days | Status: DC | PRN
Start: 2023-03-29 — End: 2023-05-28

## 2023-04-02 ENCOUNTER — Inpatient Hospital Stay: Admit: 2023-04-02 | Discharge: 2023-04-02 | Disposition: A | Discharge status: Home / Self Care | Payer: MEDICARE

## 2023-04-02 ENCOUNTER — Emergency Department: Admit: 2023-04-02 | Payer: MEDICARE | Primary: Internal Medicine

## 2023-04-02 DIAGNOSIS — J40 Bronchitis, not specified as acute or chronic: Secondary | ICD-10-CM

## 2023-04-02 DIAGNOSIS — J441 Chronic obstructive pulmonary disease with (acute) exacerbation: Principal | ICD-10-CM

## 2023-04-02 LAB — COMPREHENSIVE METABOLIC PANEL
ALT: 24 U/L (ref 0–55)
AST: 35 U/L (ref 6–42)
Albumin: 3.1 g/dL — ABNORMAL LOW (ref 3.2–5.0)
Alkaline phosphatase: 52 U/L (ref 30–130)
Anion Gap: 6 mmol/L (ref 3–14)
BUN: 14 mg/dL (ref 6–24)
Bilirubin, total: 1 mg/dL (ref 0.2–1.2)
CO2 (Bicarbonate): 25 mmol/L (ref 20–32)
Calcium: 8.4 mg/dL — ABNORMAL LOW (ref 8.5–10.5)
Chloride: 106 mmol/L (ref 98–110)
Creatinine: 1.01 mg/dL (ref 0.55–1.30)
Glucose: 109 mg/dL (ref 70–139)
Potassium: 3.5 mmol/L — ABNORMAL LOW (ref 3.6–5.2)
Protein, total: 6.5 g/dL (ref 6.0–8.4)
Sodium: 137 mmol/L (ref 135–146)
eGFRcr: 75 mL/min/{1.73_m2} (ref >=60)

## 2023-04-02 LAB — CBC WITH DIFFERENTIAL
Basophils %: 0.2 %
Basophils Absolute: 0.01 10*3/uL (ref 0.00–0.22)
Eosinophils %: 0.4 %
Eosinophils Absolute: 0.02 10*3/uL (ref 0.00–0.50)
Hematocrit: 40.4 % (ref 37.0–53.0)
Hemoglobin: 13.2 g/dL (ref 13.0–17.5)
Immature Granulocytes %: 0.2 %
Immature Granulocytes Absolute: 0.01 10*3/uL (ref 0.00–0.10)
Lymphocyte %: 21.2 %
Lymphocytes Absolute: 1.2 10*3/uL (ref 0.70–4.00)
MCH: 26.8 pg (ref 26.0–34.0)
MCHC: 32.7 g/dL (ref 31.0–37.0)
MCV: 82.1 fL (ref 80.0–100.0)
MPV: 10.6 fL (ref 9.1–12.4)
Monocytes %: 11.6 %
Monocytes Absolute: 0.66 10*3/uL (ref 0.38–0.83)
NRBC %: 0 % (ref 0.0–0.0)
NRBC Absolute: 0 10*3/uL (ref 0.00–2.00)
Neutrophil %: 66.4 %
Neutrophils Absolute: 3.77 10*3/uL (ref 1.50–7.95)
Platelets: 173 10*3/uL (ref 150–400)
RBC: 4.92 M/uL (ref 4.20–5.90)
RDW-CV: 14.8 % — ABNORMAL HIGH (ref 11.5–14.5)
RDW-SD: 44.6 fL (ref 35.0–51.0)
WBC: 5.7 10*3/uL (ref 4.0–11.0)

## 2023-04-02 LAB — NT PRO BNP: NT-proBNP: 365 pg/mL — ABNORMAL HIGH (ref 0–125)

## 2023-04-02 LAB — TROPONIN I, HIGH SENSITIVITY
Troponin I, High Sensitivity: 15 ng/L
Troponin I, High Sensitivity: 13 ng/L

## 2023-04-02 LAB — LIGHT BLUE TOP

## 2023-04-02 LAB — MAGNESIUM: Magnesium: 1.8 mg/dL (ref 1.6–2.6)

## 2023-04-02 MED ORDER — potassium chloride CR (Klor-Con M20) ER tablet 20 mEq
20 | Freq: Once | ORAL | Status: AC
Start: 2023-04-02 — End: 2023-04-02
  Administered 2023-04-02: 15:00:00 20 meq via ORAL

## 2023-04-02 MED ORDER — ipratropium-albuteroL (Duo-Neb) 0.5-2.5 mg/3 mL nebulizer solution 3 mL
0.5-2.5 | Freq: Once | RESPIRATORY_TRACT | Status: AC
Start: 2023-04-02 — End: 2023-04-02
  Administered 2023-04-02: 17:00:00 3 mL via RESPIRATORY_TRACT

## 2023-04-02 MED ORDER — doxycycline (Vibramycin) 100 mg capsule
100 | ORAL_CAPSULE | Freq: Two times a day (BID) | ORAL | 0 refills | Status: DC
Start: 2023-04-02 — End: 2023-04-12

## 2023-04-02 MED ORDER — sodium chloride 0.9 % flush 10 mL
INTRAMUSCULAR | Status: DC | PRN
Start: 2023-04-02 — End: 2023-04-02

## 2023-04-02 MED ORDER — predniSONE (Deltasone) 20 mg tablet
20 | ORAL_TABLET | Freq: Every day | ORAL | 0 refills | Status: DC
Start: 2023-04-02 — End: 2023-04-12

## 2023-04-02 MED ORDER — methylPREDNISolone sodium succ (SOLU-Medrol) injection 125 mg
125 | Freq: Once | INTRAMUSCULAR | Status: AC
Start: 2023-04-02 — End: 2023-04-02
  Administered 2023-04-02: 17:00:00 125 mg via INTRAVENOUS

## 2023-04-02 MED FILL — METHYLPREDNISOLONE SODIUM SUCCINATE 125 MG SOLUTION FOR INJECTION: 125 mg | INTRAMUSCULAR | Qty: 2

## 2023-04-02 MED FILL — POTASSIUM CHLORIDE ER 20 MEQ TABLET,EXTENDED RELEASE(PART/CRYST): 20 20 mEq | ORAL | Qty: 1

## 2023-04-02 MED FILL — IPRATROPIUM-ALBUTEROL 0.5-2.5 (3) MG/3ML IN SOLN: 0.5-2.5 0.5-2.5 mg/3 mL | RESPIRATORY_TRACT | Qty: 3

## 2023-04-02 NOTE — ED Provider Notes (Signed)
 [facility]  [address]  [place] [address]        Chief Complaint   Patient presents with    Cough       HISTORY OF PRESENT ILLNESS  Patient is an 83 year old male with history of COPD, GERD, hyperlipidemia, hypothyroidism presenting for evaluation of cough. Patient states he has had a persistent productive cough for 3-4 weeks with associated shortness of breath. Endorses chest discomfort only when coughing. Denies pain with deep breaths or chest pressure. Denies hemoptysis. Denies fevers, chills, lightheadedness or dizziness. Denies abdominal pain, nausea vomiting, diarrhea. Denies pain or swelling in his legs.       History provided by:  Patient  Language interpreter used: No      PAST MEDICAL HISTORY  Patient Active Problem List   Diagnosis    Testicular hypofunction    Aneurysm of ascending aorta without rupture    Hx of adenomatous colonic polyps    Chronic obstructive pulmonary disease, unspecified (Multi-HCC)    Erectile dysfunction    GERD (gastroesophageal reflux disease)    History of gout    History of malignant neoplasm of skin    Hyperlipidemia    Hypothyroidism    History of oral cancer    Other abnormal glucose    Actinic keratosis    Vitiligo    Chronic anxiety    Mass of upper lobe of left lung    History of bilateral cataract extraction    History of radiation therapy    History of cholecystectomy    History of ankle surgery    History of appendectomy    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 No pertinent past medical history.  SURGICAL/FAM/SOCIAL HISTORY  History reviewed. No pertinent surgical history.    No family history on file.    Social History     Tobacco Use     Smoking status: Former     Types: Cigarettes    Smokeless tobacco: Never   Substance Use Topics    Alcohol use: Not Currently    Drug use: Yes     Types: Marijuana       MEDICATIONS  Prior to Admission medications    Medication Sig Start Date End Date Taking? Authorizing Provider   albuterol 90 mcg/actuation inhaler Inhale 2 puffs every 4 (four) hours if needed for wheezing.    Historical Provider, MD   celecoxib (CeleBREX) 200 mg capsule Take 200 mg by mouth twice daily.    Historical Provider, MD   fluticasone-umeclidin-vilanter (Trelegy Ellipta) 200-62.5-25 mcg blister with device Inhale 1 Inhalation once daily.    Historical Provider, MD   ipratropium-albuteroL (Duo-Neb) 0.5-2.5 mg/3 mL nebulizer solution Take 3 mL by nebulization every 6 (six) hours.    Historical Provider, MD   levothyroxine (Synthroid, Levoxyl) 112 mcg tablet Take 1 tablet (112 mcg) by mouth once daily. 03/29/23 03/28/24  [name], MD   LORazepam (Ativan) 1 mg tablet Take 1 tablet (1 mg) by mouth if needed in the morning and at bedtime for anxiety. 03/29/23   [name], MD   mirtazapine (Remeron) 15 mg  tablet Take 1 tablet (15 mg) by mouth at bedtime. 03/29/23   [name], MD   pantoprazole (ProtoNix) 40 mg EC tablet Take 1 tablet (40 mg) by mouth before breakfast. Do not crush, chew, or split. 02/06/23 02/06/24  [name], MD   pravastatin (Pravachol) 40 mg tablet Take 1 tablet (40 mg) by mouth at bedtime. 02/06/23 02/06/24  [name], MD   predniSONE (Deltasone) 10 mg tablet Take 10 mg by mouth once daily.    Historical Provider, MD   tamsulosin (Flomax) 0.4 mg 24 hr capsule Take 0.4 mg by mouth once daily.    Historical Provider, MD   levothyroxine (Synthroid, Levoxyl) 112 mcg tablet Take 1 tablet (112 mcg) by mouth in the morning. 09/28/20 03/28/23  [name], MD   LORazepam (Ativan) 1 mg tablet Take 1 tablet (1 mg) by mouth if needed in the morning and at bedtime for anxiety. 08/04/20 03/28/23  [name], MD   mirtazapine (Remeron)  15 mg tablet Take 15 mg by mouth at bedtime.  03/28/23  Historical Provider, MD      Allergies   Allergen Reactions    Belsomra [Suvorexant] Other    Iodinated Contrast Media Hives    Penicillin G Hives     REVIEW OF SYSTEMS  Review of Systems  PHYSICAL EXAM  ED Triage Vitals [04/02/23 0831]   Temp Pulse Resp BP   36.6 C (97.9 F) 77 16 137/67      SpO2 Temp Source Heart Rate Source Patient Position   96 % Temporal -- --      BP Location FiO2 (%)     -- --       Physical Exam  Vitals and nursing note reviewed.   HENT:      Head: Normocephalic and atraumatic.      Nose: Nose normal.      Mouth/Throat:      Mouth: Mucous membranes are moist.   Eyes:      Extraocular Movements: Extraocular movements intact.      Conjunctiva/sclera: Conjunctivae normal.   Cardiovascular:      Rate and Rhythm: Normal rate and regular rhythm.      Pulses: Normal pulses.      Heart sounds: No murmur heard.     No friction rub. No gallop.   Pulmonary:      Effort: Pulmonary effort is normal.      Breath sounds: Wheezing (diffuse expiratory) present. No rhonchi or rales.   Musculoskeletal:         General: Normal range of motion.      Cervical back: Normal range of motion.   Skin:     General: Skin is warm and dry.   Neurological:      General: No focal deficit present.      Mental Status: He is alert.       TESTING RESULTS  PROCEDURES  Labs Reviewed   COMPREHENSIVE METABOLIC PANEL - Abnormal       Result Value    Sodium 137      Potassium 3.5 (*)     Chloride 106      CO2 (Bicarbonate) 25      Anion Gap 6      BUN 14      Creatinine 1.01      eGFRcr 75      Glucose 109      Fasting? Unknown      Calcium 8.4 (*)     AST 35  ALT 24      Alkaline phosphatase 52      Protein, total 6.5      Albumin 3.1 (*)     Bilirubin, total 1.0     NT PRO BNP - Abnormal    NT-proBNP 365 (*)     Narrative:     The 2021 Universal Definition and Classification of Heart Rafael Bihari states that a diagnosis of heart failure (HF) requires symptoms and/or signs of  HF caused by structural and/or functional cardiac abnormalities, accompanied by objective evidence of congestion and/or:    Test               Ambulatory Patients    Hospitalized/Decompensated  BNP (pg/mL)         >=35                   >=100          NT-proBNP (pg/mL)   >=125                  >=300       Persistently elevated BNP or NT-proBNP despite HF therapies is associated with more clinical events. Patients with heart failure with preserved ejection fraction (HFpEF) have lower levels of the natriuretic peptides than patients with a similar severity of heart failure with reduced ejection fraction (HFrEF). NT-proBNP (rather than BNP) is the biomarker of choice for patients prescribed a neprilysin inhibitor.    In dyspneic patients in emergency department settings,2 NT-proBNP cut-offs for diagnosis of acute HF were 450, 900, and 1,800 pg/ml for ages <50, 50 to 58, and >75 years.    (1) Bozkurt B et al.  J Cardiac Fail 2021;27:387-413  (2) Madalyn Rob et al. J Am Coll Cardiol 2018;71:1191-1200.       CBC WITH DIFFERENTIAL - Abnormal    WBC 5.7      RBC 4.92      Hemoglobin 13.2      Hematocrit 40.4      MCV 82.1      MCH 26.8      MCHC 32.7      RDW-CV 14.8 (*)     RDW-SD 44.6      Platelets 173      MPV 10.6      Neutrophil % 66.4      Lymphocyte % 21.2      Monocytes % 11.6      Eosinophils % 0.4      Basophils % 0.2      Immature Granulocytes % 0.2      NRBC % 0.0      Neutrophils Absolute 3.77      Lymphocytes Absolute 1.20      Monocytes Absolute 0.66      Eosinophils Absolute 0.02      Basophils Absolute 0.01      Immature Granulocytes Absolute 0.01      NRBC Absolute 0.00     TROPONIN I, HIGH SENSITIVITY - Normal    Troponin I, High Sensitivity 15      Narrative:     Clinical correlation, HEART Score and shared decision making must be taken into account.                   For Chest Pain >3 Hours    Rule-Out Criteria              Single Value     0hr/1hr Delta Value  Male  <10ng/L*          <  54ng/L AND  delta <15ng/L  Male    <10ng/L*          <79ng/L AND delta <15ng/L    Cannot Rule-Out                            0hr/1hr Delta Value  Male    <54ng/L AND delta >15ng/L    >/= 54 AND delta </=15ng/L  Male      <79ng/L AND delta >15ng/L    >/= 79 AND delta </=15ng/L    Rule-In Criteria               Single Value   0hr/1hr Delta Value                 Male   >115ng/L       >/=54ng/L AND delta >/=15ng/L         Male     >115ng/L       >/=79ng/L AND delta >/=15ng/L           *Note: for Chest pain <3 hours 0hr/1hr is warranted for evaluation.     MAGNESIUM - Normal    Magnesium 1.8     TROPONIN I, HIGH SENSITIVITY - Normal    Troponin I, High Sensitivity 13      Narrative:     Clinical correlation, HEART Score and shared decision making must be taken into account.                   For Chest Pain >3 Hours    Rule-Out Criteria              Single Value     0hr/1hr Delta Value  Male  <10ng/L*          <54ng/L AND delta <15ng/L  Male    <10ng/L*          <79ng/L AND delta <15ng/L    Cannot Rule-Out                            0hr/1hr Delta Value  Male    <54ng/L AND delta >15ng/L    >/= 54 AND delta </=15ng/L  Male      <79ng/L AND delta >15ng/L    >/= 79 AND delta </=15ng/L    Rule-In Criteria               Single Value   0hr/1hr Delta Value                 Male   >115ng/L       >/=54ng/L AND delta >/=15ng/L         Male     >115ng/L       >/=79ng/L AND delta >/=15ng/L           *Note: for Chest pain <3 hours 0hr/1hr is warranted for evaluation.     CBC W/DIFF    Narrative:     The following orders were created for panel order CBC and differential.  Procedure                               Abnormality         Status                     ---------                               -----------         ------  CBC w/ Differential[[id number]]          Abnormal            Final result                 Please view results for these tests on the individual orders.     CT CHEST WO CONTRAST   Final Result       Posttreatment changes at the left mid/upper lung zone, INCREASED from 03/09/2023. There is increased peribronchial thickening with endobronchial opacification, favored to represent worsening pneumonitis or bronchial infection/inflammation. Posttreatment changes also in the differential.      Nodules at the right lower lobe and right apex, indeterminate clinical significance. Findings are not substantially changed from 03/09/2023. Consider oncologic follow-up.      Marianna Fuss 04/02/2023 11:20 AM      XR CHEST 2 VIEWS   Final Result   1.  Increased left upper lobe airspace opacity in the region of previous left upper lobe scarring, unclear whether this could represent superimposed pneumonia or pneumonitis.      2.  Slightly increased nodular densities in the right lower lobe which are nonspecific.        Burnett Kanaris MD 04/02/2023 9:07 AM          Procedures  ED COURSE/MDM  ED Course as of 04/02/23 1708   Mon Apr 02, 2023   0917 EKG independently interpreted by myself and ED attending Dr. Sherian Maroon, shows sinus tachycardia with premature supraventricular complexes, ventricular rate 101 bpm, PR interval 120, sinus, QRS duration 94 ms, QT/QTc 350/459 ms. No acute ischemic changes.          Diagnoses as of 04/02/23 1708   Bronchitis   COPD exacerbation (Multi-HCC)     ED Course & MDM   Medical Decision Making  This is an 83 year old male with history of COPD, GERD, hyperlipidemia, hypothyroidism presenting for evaluation of cough. Vital signs unremarkable. On exam patient is alert and oriented. Neurovascularly intact. Physical exam notable for wheezing throughout. No increased work of breathing.    Care Everywhere and external records/history reviewed. Differential diagnosis includes pneumonia, COPD exacerbation, viral URI, CHF. IV access obtained and above workup was obtained. Workup (laboratory studies/imaging) independently interpreted by myself, with official read of imaging per radiologist. CBC shows no leukocytosis  or anemia, overall unremarkable.  CMP shows mild hypokalemia 3.5 repleted with oral potassium chloride 20 mill equivalents.  Calcium is mildly decreased at 8.4.  Albumin 3.1.  Magnesium is within normal limits.  BNP is only mildly over 365, patient does not appear volume overloaded.  Initial repeat troponin are not elevated.  Chest x-ray shows increased left upper lobe airspace opacity in the region of previous left upper lobe scarring, cannot rule out underlying pneumonia or pneumonitis.  CT of the chest was obtained, without contrast due to iodine allergy, for further characterization.  CT shows posttreatment changes at the left mid and upper lung zone as well as increased peribronchial thickening with endobronchial opacification representing worsening pneumonitis or bronchitis.      Given patient's increase in productive cough and shortness of breath, presentation is most consistent with a COPD exacerbation.  He was given IV Solu-Medrol and duo nebulizer treatment and is feeling significantly improved.  He ambulated around emergency department and oxygen saturation remained around his baseline, which is in the low 90s.  He is requesting discharge which I do feel is reasonable at this point as he is not hypoxic.  He was given prednisone burst and doxycycline for treatment of COPD exacerbation.  He was advised to return to the ER immediately with any new or worsening symptoms.  Otherwise to follow-up closely with PCP.  All questions answered.  Patient discharged.    _ _ _ _ _ _ _ _ _ _ _ _ _ _ _ _ _ _     Prescription medications considered but not performed: Inhalers were considered however patient already has preventative and rescue inhalers at home, will use as needed    Escalation to admission/observation considered: Considered and offered however patient would like trial of treatment at home which I do feel is reasonable and was given strict return precautions    Chronic conditions affecting care:  COPD        Problems Addressed:  Bronchitis: complicated acute illness or injury  COPD exacerbation (Multi-HCC): complicated acute illness or injury    Amount and/or Complexity of Data Reviewed  External Data Reviewed: notes.  Labs: ordered. Decision-making details documented in ED Course.  Radiology: ordered and independent interpretation performed. Decision-making details documented in ED Course.    Risk  Prescription drug management.      DIAGNOSIS  Problem List Items Addressed This Visit    None  Visit Diagnoses       Bronchitis    -  Primary    Relevant Medications    ipratropium-albuteroL (Duo-Neb) 0.5-2.5 mg/3 mL nebulizer solution 3 mL (Completed)    COPD exacerbation (Multi-HCC)        Relevant Medications    ipratropium-albuteroL (Duo-Neb) 0.5-2.5 mg/3 mL nebulizer solution 3 mL (Completed)    methylPREDNISolone sodium succ (SOLU-Medrol) injection 125 mg (Completed)    predniSONE (Deltasone) 20 mg tablet    doxycycline (Vibramycin) 100 mg capsule          CONDITION  Fair    DISPOSITION  Discharged    Patient informed of evaluation results. Hospitalization is not necessary as patient is safe for discharge and appropriate for outpatient management. I have answered all questions.  Patient told to inform primary care doctor today of this ED visit and to obtain follow-up visit or return if unable to see primary care doctor.  Patient told to seek medical attention immediately for any further concerns, worsening of condition, or not getting better in the expected time thought to. Patient has been discharged.    This dictation was created through voice recognition. The dictation is proof read, however, grammatical errors and voice recognition errors could still be present. Please contact me about any errors.     Tessie Eke, [place]  04/02/23 1708

## 2023-04-02 NOTE — ED Notes (Signed)
 Workup completed and pt to be d/c home. Instructions reviewed with pt who denies any questions at this time. IV removed. Pt ambulatory out of ED with steady gait.       Mliss Fritz, RN  04/02/23 1245

## 2023-04-02 NOTE — Discharge Instructions (Signed)
 Take antibiotics as directed on prescription. Make sure to take the full course of antibiotic even if you are feeling better. Take prednisone as prescribed. Use inhalers as needed. Follow up with your primary care provider. Return to care with new or worsening symptoms or any other concerns.

## 2023-04-02 NOTE — ED Triage Notes (Addendum)
 Pt ambulates to triage w/ c/o cough x3-4 weeks and states he thinks he has pneumonia. Decreased apatite and increased fatigue w/ insomnia. + pain in chest when coughing

## 2023-04-02 NOTE — ED Notes (Signed)
 Ambulatory O2 sat 89-92%. Pt reports his baseline O2 usually 90-91%. Denies difficulty breathing/worsened SOB at this time. PA Emilie aware.      Mliss Fritz, RN  04/02/23 1235

## 2023-04-12 ENCOUNTER — Ambulatory Visit: Admit: 2023-04-12 | Discharge: 2023-04-12 | Payer: MEDICARE | Attending: Internal Medicine | Primary: Internal Medicine

## 2023-04-12 DIAGNOSIS — N3941 Urge incontinence: Secondary | ICD-10-CM

## 2023-04-12 MED ORDER — doxycycline (Vibramycin) 100 mg capsule
100 | ORAL_CAPSULE | Freq: Two times a day (BID) | ORAL | 0 refills | Status: AC
Start: 2023-04-12 — End: 2023-04-23

## 2023-04-12 MED ORDER — oxybutynin XL (Ditropan XL) 5 mg 24 hr tablet
5 | ORAL_TABLET | Freq: Every day | ORAL | 1 refills | 18.00000 days | Status: DC
Start: 2023-04-12 — End: 2023-06-11

## 2023-04-12 NOTE — Progress Notes (Signed)
 Veni completed.

## 2023-04-12 NOTE — Assessment & Plan Note (Signed)
 Keeps doxy at home for exacerbations

## 2023-04-12 NOTE — Assessment & Plan Note (Signed)
 Urine for C and S    Trial of ditropan XL    See [facility]

## 2023-04-12 NOTE — Assessment & Plan Note (Signed)
 He is referred to Dr Daly's group   To review his CAD cardiac calcifications

## 2023-04-12 NOTE — Assessment & Plan Note (Signed)
 Note mild wt loss appetite fair at best

## 2023-04-12 NOTE — Progress Notes (Signed)
 [facility] [place]  [address]  [place] [address]  Dept: [phone]  Dept Fax: [phone]     Patient ID: Justin Navarro is a 83 y.o. male who presents for Follow-up (ED Bronchitis on 03/03).    Subjective   HPI    Wants no vaccines    Wants for COPD  to have doxy   Rx in home      Chronic cough    Some urine incontinence at night   Trial of low dose Ditropan   He already is on flomax   Send urine to lab    Referred to [facility]     For exam        Needs labs for low K           Patient Active Problem List   Diagnosis    Testicular hypofunction    Hx of adenomatous colonic polyps    Chronic obstructive pulmonary disease, unspecified (Multi-HCC)    Erectile dysfunction    GERD (gastroesophageal reflux disease)    History of gout    History of malignant neoplasm of skin    Hyperlipidemia    Hypothyroidism    History of oral cancer    Other abnormal glucose    Actinic keratosis    Vitiligo    Chronic anxiety    Mass of upper lobe of left lung    History of bilateral cataract extraction    History of radiation therapy    History of cholecystectomy    History of ankle surgery    History of appendectomy    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 calories with serious comorbidity and body mass index (BMI) of 31.0 to 31.9 in adult    PAC (premature atrial contraction)    HI (head injury)    Steroid dependence (Multi-HCC)    Vitamin D deficiency    Prediabetes    Marijuana use    Urge incontinence of urine     Current Outpatient Medications   Medication Instructions    albuterol 90 mcg/actuation inhaler 2 puffs, inhalation, Every 4 hours PRN    celecoxib (CELEBREX) 200 mg, oral, 2 times daily    doxycycline (VIBRAMYCIN) 100 mg, oral, 2 times daily, Take with at least 8 ounces (large glass) of water, do not lie down for 30 minutes after    fluticasone-umeclidin-vilanter (Trelegy Ellipta) 200-62.5-25 mcg blister with  device 1 Inhalation, inhalation, Daily    ipratropium-albuteroL (Duo-Neb) 0.5-2.5 mg/3 mL nebulizer solution 3 mL, nebulization, Every 6 hours RT    levothyroxine (SYNTHROID, LEVOXYL) 112 mcg, oral, Daily    LORazepam (ATIVAN) 1 mg, oral, 2 times daily PRN    mirtazapine (REMERON) 15 mg, oral, Nightly    oxybutynin XL (DITROPAN XL) 5 mg, oral, Daily, Do not crush, chew, or split.    pantoprazole (PROTONIX) 40 mg, oral, Daily before breakfast, Do not crush, chew, or split.    pravastatin (PRAVACHOL) 40 mg, oral, Nightly    predniSONE (DELTASONE) 10 mg, oral, Daily    tamsulosin (FLOMAX) 0.4 mg, oral, Daily     Allergies   Allergen Reactions    Belsomra [Suvorexant] Other    Iodinated Contrast Media Hives    Penicillin G Hives     No past medical history on file.  No past surgical history on file.  No family history on file.  Social History  Socioeconomic History    Marital status: Divorced     Spouse name: Not on file    Number of children: Not on file    Years of education: Not on file    Highest education level: Not on file   Occupational History    Not on file   Tobacco Use    Smoking status: Former     Types: Cigarettes    Smokeless tobacco: Never   Substance and Sexual Activity    Alcohol use: Not Currently    Drug use: Yes     Types: Marijuana    Sexual activity: Not on file   Other Topics Concern    Not on file   Social History Narrative    Not on file     Social Determinants of Health     Financial Resource Strain: Not on file   Food Insecurity: Not on file   Transportation Needs: Not on file   Physical Activity: Not on file   Stress: Not on file   Social Connections: Not on file   Intimate Partner Violence: Not on file   Housing Stability: Not on file     Review of Systems  Medications Discontinued During This Encounter   Medication Reason    predniSONE (Deltasone) 20 mg tablet     doxycycline (Vibramycin) 100 mg capsule      Outpatient Medications Prior to Visit   Medication Sig Dispense Refill     albuterol 90 mcg/actuation inhaler Inhale 2 puffs every 4 (four) hours if needed for wheezing.      celecoxib (CeleBREX) 200 mg capsule Take 200 mg by mouth twice daily.      fluticasone-umeclidin-vilanter (Trelegy Ellipta) 200-62.5-25 mcg blister with device Inhale 1 Inhalation once daily.      ipratropium-albuteroL (Duo-Neb) 0.5-2.5 mg/3 mL nebulizer solution Take 3 mL by nebulization every 6 (six) hours.      levothyroxine (Synthroid, Levoxyl) 112 mcg tablet Take 1 tablet (112 mcg) by mouth once daily. 90 tablet 3    LORazepam (Ativan) 1 mg tablet Take 1 tablet (1 mg) by mouth if needed in the morning and at bedtime for anxiety. 60 tablet 0    mirtazapine (Remeron) 15 mg tablet Take 1 tablet (15 mg) by mouth at bedtime. 90 tablet 3    pantoprazole (ProtoNix) 40 mg EC tablet Take 1 tablet (40 mg) by mouth before breakfast. Do not crush, chew, or split. 90 tablet 3    pravastatin (Pravachol) 40 mg tablet Take 1 tablet (40 mg) by mouth at bedtime. 90 tablet 3    predniSONE (Deltasone) 10 mg tablet Take 10 mg by mouth once daily.      tamsulosin (Flomax) 0.4 mg 24 hr capsule Take 0.4 mg by mouth once daily.      doxycycline (Vibramycin) 100 mg capsule Take 1 capsule (100 mg) by mouth twice daily for 7 days. Take with at least 8 ounces (large glass) of water, do not lie down for 30 minutes after 14 capsule 0    predniSONE (Deltasone) 20 mg tablet Take 2 tablets (40 mg) by mouth once daily for 5 days. 10 tablet 0       Objective   Visit Vitals  BP 100/62 (BP Location: Left arm, Patient Position: Sitting, BP Cuff Size: Large adult)   Pulse 79   Temp 36.1 C (96.9 F) (Temporal)   Ht 1.829 m   Wt 104 kg   SpO2 (!) 93%   BMI 31.08 kg/m  BSA 2.3 m     Physical Exam  Vitals reviewed.   Constitutional:       Appearance: Normal appearance. He is obese.      Comments: Has lost a few lbs in last few months    Wt-229 pounds today    Wt in Jan -233   Lost 4 pounds in few months   HENT:      Head: Normocephalic and atraumatic.       Right Ear: External ear normal.      Left Ear: External ear normal.      Nose: Nose normal.      Mouth/Throat:      Mouth: Mucous membranes are moist.      Pharynx: Oropharynx is clear.   Eyes:      Extraocular Movements: Extraocular movements intact.      Conjunctiva/sclera: Conjunctivae normal.      Pupils: Pupils are equal, round, and reactive to light.      Comments: Hx of bilateral cataract extraction   Cardiovascular:      Rate and Rhythm: Normal rate and regular rhythm.      Pulses: Normal pulses.      Heart sounds: Normal heart sounds.   Pulmonary:      Effort: Pulmonary effort is normal.      Breath sounds: Normal breath sounds.      Comments: Chronic cough   Hx of lung cancer treated with irradiation   Abdominal:      General: Abdomen is flat. Bowel sounds are normal.   Musculoskeletal:         General: Normal range of motion.      Cervical back: Normal range of motion and neck supple.   Skin:     General: Skin is warm and dry.   Neurological:      General: No focal deficit present.      Mental Status: He is alert and oriented to person, place, and time. Mental status is at baseline.   Psychiatric:         Mood and Affect: Mood normal.         Behavior: Behavior normal.         Thought Content: Thought content normal.         Assessment/Plan   Justin Navarro was seen today for follow-up.  Urge incontinence  -     oxybutynin XL (Ditropan XL) 5 mg 24 hr tablet; Take 1 tablet (5 mg) by mouth once daily. Do not crush, chew, or split.  COPD exacerbation (Multi-HCC)  -     doxycycline (Vibramycin) 100 mg capsule; Take 1 capsule (100 mg) by mouth twice daily for 7 days. Take with at least 8 ounces (large glass) of water, do not lie down for 30 minutes after  Urge incontinence of urine  Assessment & Plan:  Urine for C and S    Trial of ditropan XL    See [facility]   Orders:  -     Urinalysis reflex to culture  Hypokalemia  -     CBC and differential  -     Basic metabolic panel  Prediabetes  -     Hemoglobin  A1c  Mass of upper lobe of left lung  Assessment & Plan:  Nodules being followed on CT   He needs updated CT in spring   Already ordered    Prednisone dependent for emphysema  Centrilobular emphysema (Multi-HCC)  Assessment & Plan:     Keeps  doxy at home for exacerbations  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 calories with serious comorbidity and body mass index (BMI) of 31.0 to 31.9 in adult  Assessment & Plan:   Note mild wt loss appetite fair at best  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

## 2023-04-12 NOTE — Assessment & Plan Note (Signed)
 Nodules being followed on CT   He needs updated CT in spring   Already ordered    Prednisone dependent for emphysema

## 2023-04-13 ENCOUNTER — Ambulatory Visit: Admit: 2023-04-13 | Discharge: 2023-04-13 | Payer: MEDICARE | Attending: Pulmonary Disease | Primary: Internal Medicine

## 2023-04-13 DIAGNOSIS — J432 Centrilobular emphysema: Secondary | ICD-10-CM

## 2023-04-13 LAB — BASIC METABOLIC PANEL
Anion Gap: 13 mmol/L (ref 10.0–18.0)
BUN/Creat Ratio: 24 (ref 10–24)
BUN: 21 mg/dL (ref 8–27)
Calcium: 9 mg/dL (ref 8.6–10.2)
Carbon Dioxide: 25 mmol/L (ref 20–29)
Chloride: 104 mmol/L (ref 96–106)
Creat: 0.88 mg/dL (ref 0.76–1.27)
Glucose: 111 mg/dL — ABNORMAL HIGH (ref 70–99)
Potassium: 4.8 mmol/L (ref 3.5–5.2)
Sodium: 142 mmol/L (ref 134–144)
eGFR: 86 mL/min/{1.73_m2} (ref >59)

## 2023-04-13 LAB — URINALYSIS REFLEX TO CULTURE
Bilirubin Ur: NEGATIVE
Glucose Ur: NEGATIVE
Ketones Ur: NEGATIVE
Nitrite Ur: NEGATIVE
Occult Blood Ur: NEGATIVE
Protein Ur: NEGATIVE
Specific Gravity Ur: 1.028 (ref 1.005–1.030)
Urobilinogen,Semi-Qn Ur: 1 mg/dL (ref 0.2–1.0)
WBC Esterase Ur: NEGATIVE
pH Ur: 5.5 (ref 5.0–7.5)

## 2023-04-13 LAB — URINE MICROSCOPIC (REFLEX ONLY)
Bacteria Ur: NONE SEEN
Casts Ur: NONE SEEN /LPF
Epithelial Cells (non renal) Ur: NONE SEEN /HPF (ref 0–10)
RBC Ur: NONE SEEN /HPF (ref 0–2)
WBC Ur: NONE SEEN /HPF (ref 0–5)

## 2023-04-13 LAB — CBC W/DIFF
Baso Abs: 0 10*3/uL (ref 0.0–0.2)
Basos: 0 %
Eos Abs: 0.1 10*3/uL (ref 0.0–0.4)
Eos: 1 %
Hct: 39.9 % (ref 37.5–51.0)
Hgb: 13.2 g/dL (ref 13.0–17.7)
Immature Grans Abs: 0.1 10*3/uL (ref 0.0–0.1)
Immature Granulocytes: 1 %
Lymphs Abs: 1 10*3/uL (ref 0.7–3.1)
Lymphs: 10 %
MCH: 26.8 pg (ref 26.6–33.0)
MCHC: 33.1 g/dL (ref 31.5–35.7)
MCV: 81 fL (ref 79–97)
Monocytes: 6 %
MonocytesAbs: 0.5 10*3/uL (ref 0.1–0.9)
Neutrophils Abs: 7.5 10*3/uL — ABNORMAL HIGH (ref 1.4–7.0)
Neutrophils: 82 %
Platelets: 257 10*3/uL (ref 150–450)
RBC: 4.93 x10E6/uL (ref 4.14–5.80)
RDW: 14.9 % (ref 11.6–15.4)
WBC: 9.2 10*3/uL (ref 3.4–10.8)

## 2023-04-13 LAB — HEMOGLOBIN A1C: HgbA1C: 6.4 % — ABNORMAL HIGH (ref 4.8–5.6)

## 2023-04-13 MED ORDER — predniSONE (Deltasone) 10 mg tablet
10 | ORAL_TABLET | ORAL | 0 refills | Status: AC
Start: 2023-04-13 — End: 2023-05-13

## 2023-04-13 MED ORDER — fluticasone-umeclidin-vilanter (Trelegy Ellipta) 200-62.5-25 mcg blister with device
200-62.5-25 | Freq: Every day | RESPIRATORY_TRACT | 2 refills | 30.00000 days | Status: DC
Start: 2023-04-13 — End: 2023-05-25

## 2023-04-13 MED ORDER — roflumilast 250 mcg tablet
250 | ORAL_TABLET | Freq: Every day | ORAL | 2 refills | Status: AC
Start: 2023-04-13 — End: ?

## 2023-04-13 NOTE — Progress Notes (Signed)
 [facility]  [address]  [place] [address]  Dept: [phone]     Patient ID: Justin Navarro is a 83 y.o. male who presents for NP Lung Cancer  CT in EPIC  approv by admin . (Breathing is stable    Subjective   HPI  Justin Navarro is a 83 y.o. male who presents for . Pt takes trelegy daily inhaler and albuterol prn, does not use often.  Denies chest pain,  Pt recently lost 4 pounds due to decrease in appetite. Dyspnea with exertion and congested coughs.   Pt was treated for lung cancer in [place]. He was told in Sep 2024 that his left lung was clearing up.       === 04/02/23 ===    CT CHEST WO CONTRAST    - Impression -  Posttreatment changes at the left mid/upper lung zone, INCREASED from 03/09/2023. There is increased peribronchial thickening with endobronchial opacification, favored to represent worsening pneumonitis or bronchial infection/inflammation. Posttreatment changes also in the differential.    Nodules at the right lower lobe and right apex, indeterminate clinical significance. Findings are not substantially changed from 03/09/2023. Consider oncologic follow-up.          Review of Systems  ROS:   Constitutional:  no fever or chills   Eyes: negative  Ears, nose, mouth, throat, and face: negative  Respiratory: negative, dyspnea and coughs.   Cardiovascular: negative  Gastrointestinal: negative  Musculoskeletal:negative  Neurological: negative  Behavioral/Psych: negative  Endocrine: negative    Patient Active Problem List   Diagnosis    Testicular hypofunction    Hx of adenomatous colonic polyps    Chronic obstructive pulmonary disease, unspecified (Multi-HCC)    Erectile dysfunction    GERD (gastroesophageal reflux disease)    History of gout    History of malignant neoplasm of skin    Hyperlipidemia    Hypothyroidism    History of oral cancer    Other abnormal glucose    Actinic keratosis    Vitiligo    Chronic anxiety    Mass of upper lobe of  left lung    History of bilateral cataract extraction    History of radiation therapy    History of cholecystectomy    History of ankle surgery    History of appendectomy    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 calories with serious comorbidity and body mass index (BMI) of 31.0 to 31.9 in adult    PAC (premature atrial contraction)    HI (head injury)    Steroid dependence (Multi-HCC)    Vitamin D deficiency    Prediabetes    Marijuana use    Urge incontinence of urine     Current Outpatient Medications   Medication Instructions    albuterol 90 mcg/actuation inhaler 2 puffs, inhalation, Every 4 hours PRN    celecoxib (CELEBREX) 200 mg, oral, 2 times daily    doxycycline (VIBRAMYCIN) 100 mg, oral, 2 times daily, Take with at least 8 ounces (large glass) of water, do not lie down for 30 minutes after    fluticasone-umeclidin-vilanter (Trelegy Ellipta) 200-62.5-25 mcg blister with device 1 Inhalation, inhalation, Daily, Rinse mouth after taking it.    ipratropium-albuteroL (Duo-Neb) 0.5-2.5 mg/3 mL nebulizer solution 3 mL, nebulization, Every 6 hours RT    levothyroxine (SYNTHROID, LEVOXYL) 112 mcg, oral, Daily    LORazepam (ATIVAN) 1 mg, oral, 2  times daily PRN    mirtazapine (REMERON) 15 mg, oral, Nightly    oxybutynin XL (DITROPAN XL) 5 mg, oral, Daily, Do not crush, chew, or split.    pantoprazole (PROTONIX) 40 mg, oral, Daily before breakfast, Do not crush, chew, or split.    pravastatin (PRAVACHOL) 40 mg, oral, Nightly    predniSONE (Deltasone) 10 mg tablet Take 4 tabs (40mg ) daily for 3 days, then 3 tabs (30mg ) daily for 3 days,  2 tabs (20mg ) daily for 3 days,  1 tab (10 mg) daily for 3 days.    predniSONE (DELTASONE) 10 mg, oral, Daily    roflumilast 250 mcg, oral, Daily    tamsulosin (FLOMAX) 0.4 mg, oral, Daily     Allergies   Allergen Reactions    Belsomra [Suvorexant] Other    Iodinated Contrast Media Hives    Penicillin G Hives     No past medical history on  file.  No past surgical history on file.  No family history on file.  Social History     Tobacco Use    Smoking status: Former     Types: Cigarettes    Smokeless tobacco: Never   Substance Use Topics    Alcohol use: Not Currently    Drug use: Yes     Types: Marijuana       Objective   Visit Vitals  BP 119/76 (BP Location: Left arm, Patient Position: Sitting, BP Cuff Size: Large adult)   Pulse 66   Temp 35.7 C (96.2 F) (Temporal)   SpO2 96% Comment: room air       Physical Exam  General:      well developed, well nourished, in no acute distress.    Chest Wall:      no deformities or breast masses noted.    Lungs:      wheezing and rhonchi  Abdomen:       normal bowel sounds; no hepatosplenomegaly no ventral,umbilical hernias or masses noted.    Msk:      no deformity or scoliosis noted of thoracic or lumbar spine.    Extremities:      no clubbing, cyanosis, edema, or deformity noted with normal full range of motion of all joints.    Psych:      alert and cooperative; normal mood and affect; normal attention span and concentration.     Assessment/Plan   Justin Navarro was seen today for np lung cancer  ct in epic  approv by admin .  Centrilobular emphysema (Multi-HCC)  -     roflumilast 250 mcg tablet; Take 250 mcg by mouth once daily.  -     fluticasone-umeclidin-vilanter (Trelegy Ellipta) 200-62.5-25 mcg blister with device; Inhale 1 Inhalation once daily. Rinse mouth after taking it.  -     predniSONE (Deltasone) 10 mg tablet; Take 4 tabs (40mg ) daily for 3 days, then 3 tabs (30mg ) daily for 3 days,  2 tabs (20mg ) daily for 3 days,  1 tab (10 mg) daily for 3 days.  Bronchitis  Abnormal chest CT  Chronic obstructive pulmonary disease with acute exacerbation (Multi-HCC)      Justin Navarro is a 83 y.o. male who presents for uncontrolled copd and abnormal chest ct    PLAN: hx of lung cancer,treated in [place] in 2024. Reviewed chest ct images. Unable to find prior chest images in [place].  Pneumonia vs malignancy. Pt just  finished a course of antibiotic.   Pt will repeat a chest ct in  early May. Will make a sooner ct app.   consider pet ct if no improvement, pt also has an app with [facility]  for the concern of malignancy.   Copd exacerbation  Start prednisone taper.   Continue trelegy  Consider mucinex  Start daliresp.     - RTC after next ct    I, Menab Said, attest that this documentation has been prepared under the direction and in the presence of Elliot Cousin MD.     Electronically signed 04/13/23 12:30 PM    All medical record entries made by the Scribe were at my direction and personally dictated by me. I have reviewed the chart and agree that the record accurately reflects my personal performance of the history, physical examination, assessment and plan. I have also personally dictated, reviewed, and agree with the discharge instructions. The patient verbally agreed to the scribe.  Elliot Cousin MD  04/13/23 12:30 PM

## 2023-04-13 NOTE — Telephone Encounter (Signed)
 Left a VM to the patient call the office back.    Per Dr. Dianne Dun request we do have to move the patient's CT appointment up to April. We al;so have to book your follow up with Dr. Marcheta Grammes at the end of April.      [facility] if the patient calls back please tranfers him to the office.

## 2023-04-23 ENCOUNTER — Ambulatory Visit
Admit: 2023-04-23 | Discharge: 2023-04-23 | Payer: MEDICARE | Attending: Student in an Organized Health Care Education/Training Program | Primary: Internal Medicine

## 2023-04-23 DIAGNOSIS — I2510 Atherosclerotic heart disease of native coronary artery without angina pectoris: Secondary | ICD-10-CM

## 2023-04-23 MED ORDER — atorvastatin (Lipitor) 40 mg tablet
40 | ORAL_TABLET | Freq: Every day | ORAL | 3 refills | 90.00000 days | Status: AC
Start: 2023-04-23 — End: 2024-04-22

## 2023-04-23 NOTE — Progress Notes (Signed)
 Department of Cardiovascular Medicine  [address]  [address]  [place] [address]  Scheduling: [phone]  Dept: [phone]  Dept Fax: [phone]     Cardiology Consultation     Date of Service: 04/23/2023    Patient: Justin Navarro  DOB: [birth year]   MRN#: [id number]  CSN: [id number]  Primary Care Physician: [name], MD    Reason For Consultation: Coronary artery calcifications    History of Present Illness: This is a 83 y.o. 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    In September 2024 had a fall and hit his head on the toilet.  He did not member much surrounding the fall but knows that he was also sick with COVID at the time.  Was diagnosed with a intracranial bleed while in [place].  No surgery was required per report.  Was discharged to rehab afterwards.    He has been doing overall well.  He moved back to [place] as him and his girlfriend were tired of living [place].  He denies any overt chest discomfort or chest pain.  He does have exertional shortness of breath and states he can walk 1/4 mile before he gets dyspneic.  He used to play golf but does not play as much anymore.    He cannot recall a prior history of a stress test.    He used to smoke 2 to 3 packs/day but quit over 30 years ago.  No significant family history of heart disease.    History:  Patient Active Problem List   Diagnosis    Testicular hypofunction    Hx of adenomatous colonic polyps    Chronic obstructive pulmonary disease, unspecified (Multi-HCC)    Erectile dysfunction    GERD (gastroesophageal reflux disease)    History of gout    History of malignant neoplasm of skin    Mixed hyperlipidemia    Hypothyroidism     History of oral cancer    Other abnormal glucose    Actinic keratosis    Vitiligo    Chronic anxiety    Mass of upper lobe of left lung    History of bilateral cataract extraction    History of radiation therapy    History of cholecystectomy    History of ankle surgery    History of appendectomy    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 calories with serious comorbidity and body mass index (BMI) of 31.0 to 31.9 in adult    PAC (premature atrial contraction)    HI (head injury)    Steroid dependence (Multi-HCC)    Vitamin D deficiency    Prediabetes    Marijuana use    Urge incontinence of urine         Allergies:  Belsomra [suvorexant], Iodinated contrast media, and Penicillin g    Current Medications:   Current Outpatient Medications   Medication Instructions    albuterol 90 mcg/actuation inhaler 2 puffs, inhalation, Every 4 hours PRN    atorvastatin (LIPITOR) 40 mg, oral, Daily    celecoxib (CELEBREX) 200 mg, oral, 2 times daily    doxycycline (  VIBRAMYCIN) 100 mg, oral, 2 times daily, Take with at least 8 ounces (large glass) of water, do not lie down for 30 minutes after    fluticasone-umeclidin-vilanter (Trelegy Ellipta) 200-62.5-25 mcg blister with device 1 Inhalation, inhalation, Daily, Rinse mouth after taking it.    ipratropium-albuteroL (Duo-Neb) 0.5-2.5 mg/3 mL nebulizer solution 3 mL, nebulization, Every 6 hours RT    levothyroxine (SYNTHROID, LEVOXYL) 112 mcg, oral, Daily    LORazepam (ATIVAN) 1 mg, oral, 2 times daily PRN    mirtazapine (REMERON) 15 mg, oral, Nightly    oxybutynin XL (DITROPAN XL) 5 mg, oral, Daily, Do not crush, chew, or split.    pantoprazole (PROTONIX) 40 mg, oral, Daily before breakfast, Do not crush, chew, or split.    predniSONE (Deltasone) 10 mg tablet Take 4 tabs (40mg ) daily for 3 days, then 3 tabs (30mg ) daily for 3 days,  2 tabs (20mg ) daily for 3 days,  1 tab (10 mg) daily for 3 days.    predniSONE (DELTASONE) 10 mg, oral, Daily     roflumilast 250 mcg, oral, Daily    tamsulosin (FLOMAX) 0.4 mg, oral, Daily        Review of Systems:   HEENT: no glaucoma; no epistaxis; no dysphagia.  Pulmonary: no asthma or emphysema; no pneumonia or tuberculosis; no hemoptysis.  Cardiac: ss History of Present Illness.  GI: no peptic ulcer disease; no hepatitis; no cholelithiasis; no melena or bright red blood per rectum.  GU: no kidney stones or infection; no hematuria; no difficulty urinating.  Endocrine:  no thyroid disease; no diabetes.  Neurologic: no stroke or transient ischemic attack; no gait disturbance.  Musculoskeletal: no gout; no osteoarthritis.    Physical Exam:   Recent vitals (last 24 hours):  Visit Vitals  BP 138/68   Pulse 68   Ht 1.791 m (5' 10.5")   Wt 104.3 kg (230 lb)   BMI 32.54 kg/m   BSA 2.28 m        General: no acute distress.  HEENT: PERRLA; normocephalic; atraumatic  Neck: supple, JVP at base of neck  Lungs: no wheezes, rales, or rhonchi  Heart: regular in rate and rhythm; no murmur, rub or gallop  Abdomen: soft, non-tender; no hepatosplenomegaly; no bruits  Extremities: no clubbing, cyanosis, or edema, intact distal pulses   Neurologic: alert and oriented x 3.    Labs, Imaging & Other Studies:   Diagnostic Cardiology:   CT chest 04/02/2023  CARDIOVASCULAR: Severe coronary artery calcifications. No significant pericardial effusion. Severe atherosclerotic calcifications of the aortic arch, proximal aortic arch branches and descending thoracic aorta.       Assessment/Plan:  # Coronary artery calcifications  # Hyperlipidemia  He has severe coronary calcifications that were seen on CT scan.  Recommend more aggressive lowering of his cholesterol.  Goal LDL<70.    While his exertional dyspnea is likely from his COPD, recommend a stress test to rule out obstructive CAD in light of his severe calcifications.    Given his history of a traumatic head bleed, possibly a subdural, would avoid aspirin at this time.    -Stop pravastatin, start  atorvastatin 40  -Echocardiogram  -Pharmacologic SPECT    RTC in 3 months    Claris Gladden, MD  04/23/2023

## 2023-04-30 ENCOUNTER — Inpatient Hospital Stay
Admit: 2023-04-30 | Payer: MEDICARE | Attending: Student in an Organized Health Care Education/Training Program | Primary: Internal Medicine

## 2023-04-30 DIAGNOSIS — I2510 Atherosclerotic heart disease of native coronary artery without angina pectoris: Secondary | ICD-10-CM

## 2023-04-30 MED ORDER — technetium Tc-99m tetrofosmin radio-isotope injection 31 millicurie
1.38 | Freq: Once | INTRAVENOUS | Status: AC
Start: 2023-04-30 — End: 2023-04-30
  Administered 2023-04-30: 14:00:00 31 via INTRAVENOUS

## 2023-04-30 MED ORDER — regadenoson (Lexiscan) 0.4 mg/5 mL radio-isotope injection  - Omnicell Override Pull
0.4 | INTRAVENOUS | Status: AC
Start: 2023-04-30 — End: ?

## 2023-04-30 MED ORDER — regadenoson (Lexiscan) radio-isotope injection 0.4 mg
0.4 | Freq: Once | INTRAVENOUS | Status: AC
Start: 2023-04-30 — End: 2023-04-30
  Administered 2023-04-30: 12:00:00 0.4 mg via INTRAVENOUS

## 2023-04-30 MED ORDER — technetium Tc-99m tetrofosmin radio-isotope injection 13 millicurie
1.38 | Freq: Once | INTRAVENOUS | Status: AC
Start: 2023-04-30 — End: 2023-04-30
  Administered 2023-04-30: 12:00:00 13 via INTRAVENOUS

## 2023-04-30 MED FILL — REGADENOSON 0.4 MG/5 ML INTRAVENOUS SYRINGE: 0.4 0.4 mg/5 mL | INTRAVENOUS | Qty: 5

## 2023-04-30 NOTE — Nursing Note (Signed)
 Pt completed Lexi stress test w/o complicaiton. MD report to follow

## 2023-04-30 NOTE — Telephone Encounter (Signed)
 Called and relayed normal stress test results.

## 2023-05-03 NOTE — Telephone Encounter (Signed)
 CT is scheduled for 06/07/23.

## 2023-05-07 MED ORDER — doxycycline (Vibramycin) 100 mg capsule
100 | ORAL_CAPSULE | Freq: Two times a day (BID) | ORAL | 0 refills | Status: AC
Start: 2023-05-07 — End: 2023-05-17

## 2023-05-07 NOTE — Telephone Encounter (Signed)
 Called and spoke with pt. Pt stated that he is requesting the antibiotic because he is still coughing up yellowish- green thick phlegm.

## 2023-05-07 NOTE — Telephone Encounter (Signed)
-----   Message from Dr. Louvella Royalty, MD sent at 05/07/2023 10:48 AM EDT -----  Regarding: Not refilled  Name from pharmacy: DOXYCYCLINE HYC 100MG  CAPS        Will file in chart as: doxycycline (Vibramycin) 100 mg capsule   Sig: TAKE 1 CAPSULE BY MOUTH TWICE DAILY FOR 7 DAYS.TAKE WITH AT LEAST 8 OUNCES LARGE GLASS OF WATER,DO NOT LIE DOWN FOR 30 MINUTES AFTER   Disp: 14 capsule    Refills: 0 (Pharmacy requested: Not specified)   Start: 05/07/2023   Class: Normal   Non-formulary For: COPD exacerbation (Multi-HCC)   Last ordered: 3 weeks ago (04/12/2023) by Louvella Royalty, MD   Last refill: 04/12/2023   Rx #: [id number]    Patient has to make contact to tell us    Why he needs antibiotics

## 2023-05-07 NOTE — Telephone Encounter (Signed)
 Last OV with PCP: 04/12/2023    Next OV with PCP: 10/10/2023    Last HGBA1C:   HgbA1C (%)   Date Value   04/12/2023 6.4 (H)       Last BP:   BP Readings from Last 3 Encounters:   04/30/23 (!) 140/80   04/23/23 138/68   04/13/23 119/76       TSH:   TSH   Date Value Ref Range Status   02/06/2023 1.160 0.450 - 4.500 uIU/mL Final     Comment:     No apparent thyroid disorder. Additional testing not indicated. In  rare instances, Secondary Hypothyroidism as well as Subclinical  Hypothyroidism have been reported in some patients with normal TSH  values.         Last TOX screen (if applicable):    Labs    Lab Results   Component Value Date    CALCIUM 9.0 04/12/2023    ALBUMIN 3.1 (L) 04/02/2023    NA 142 04/12/2023    K 4.8 04/12/2023    CO2 25 04/12/2023    CL 104 04/12/2023    BUN 21 04/12/2023    CREATININE 0.88 04/12/2023      AST   Date Value Ref Range Status   04/02/2023 35 6 - 42 U/L Final   02/06/2023 16 0 - 40 IU/L Final     ALT   Date Value Ref Range Status   04/02/2023 24 0 - 55 U/L Final   02/06/2023 11 0 - 44 IU/L Final     No components found for: "TBILI"  Hemoglobin   Date Value Ref Range Status   04/02/2023 13.2 13.0 - 17.5 g/dL Final     Hgb   Date Value Ref Range Status   04/12/2023 13.2 13.0 - 17.7 g/dL Final     Hematocrit   Date Value Ref Range Status   04/02/2023 40.4 37.0 - 53.0 % Final     Hct   Date Value Ref Range Status   04/12/2023 39.9 37.5 - 51.0 % Final     Platelets   Date Value Ref Range Status   04/12/2023 257 150 - 450 x10E3/uL Final   04/02/2023 173 150 - 400 K/uL Final     No results found for: "PTT"  No results found for: "INR"  No results found for: "PROTIME"  No results found for: "PTADJUSTED"

## 2023-05-07 NOTE — Telephone Encounter (Signed)
 Department Name: Primary care     Patient: Justin Navarro  MRN: [id number]  Agent: Lindon Rhine    Clinical Access Center Scheduling Message    Patient requesting call back from: Dr. Lupe Salk or nurse     In regards to: Patient said Pharmacy told him Dr. Lupe Salk decline Rx doxycycline hyclate 100 mg and he would like to know why and what can he take it now .     Best call back number: [phone] [facility])

## 2023-05-14 MED ORDER — celecoxib (CeleBREX) 200 mg capsule
200 | ORAL_CAPSULE | Freq: Two times a day (BID) | ORAL | 3 refills | Status: AC
Start: 2023-05-14 — End: ?

## 2023-05-14 NOTE — Telephone Encounter (Signed)
 Last OV with PCP: 04/12/2023    Next OV with PCP: 10/10/2023    Last HGBA1C:   HgbA1C (%)   Date Value   04/12/2023 6.4 (H)       Last BP:   BP Readings from Last 3 Encounters:   04/30/23 (!) 140/80   04/23/23 138/68   04/13/23 119/76       TSH:   TSH   Date Value Ref Range Status   02/06/2023 1.160 0.450 - 4.500 uIU/mL Final     Comment:     No apparent thyroid disorder. Additional testing not indicated. In  rare instances, Secondary Hypothyroidism as well as Subclinical  Hypothyroidism have been reported in some patients with normal TSH  values.         Last TOX screen (if applicable):    Labs    Lab Results   Component Value Date    CALCIUM 9.0 04/12/2023    ALBUMIN 3.1 (L) 04/02/2023    NA 142 04/12/2023    K 4.8 04/12/2023    CO2 25 04/12/2023    CL 104 04/12/2023    BUN 21 04/12/2023    CREATININE 0.88 04/12/2023      AST   Date Value Ref Range Status   04/02/2023 35 6 - 42 U/L Final   02/06/2023 16 0 - 40 IU/L Final     ALT   Date Value Ref Range Status   04/02/2023 24 0 - 55 U/L Final   02/06/2023 11 0 - 44 IU/L Final     No components found for: "TBILI"  Hemoglobin   Date Value Ref Range Status   04/02/2023 13.2 13.0 - 17.5 g/dL Final     Hgb   Date Value Ref Range Status   04/12/2023 13.2 13.0 - 17.7 g/dL Final     Hematocrit   Date Value Ref Range Status   04/02/2023 40.4 37.0 - 53.0 % Final     Hct   Date Value Ref Range Status   04/12/2023 39.9 37.5 - 51.0 % Final     Platelets   Date Value Ref Range Status   04/12/2023 257 150 - 450 x10E3/uL Final   04/02/2023 173 150 - 400 K/uL Final     No results found for: "PTT"  No results found for: "INR"  No results found for: "PROTIME"  No results found for: "PTADJUSTED"

## 2023-05-14 NOTE — Telephone Encounter (Signed)
 Department Name: Primary Care    Patient: Justin Navarro  MRN: [id number]  Agent: Oliva Beth    Clinical Access Center Scheduling Message    Patient called to request a refill of:  [MEDICATION NAMES HERE]    celecoxib  (CeleBREX ) 200 mg capsule     Pharmacy:  [id number] - [place], [place] - [address]: [phone]  F: [phone]    Informed patient it can take 3 business days to send prescription refill to pharmacy.    Call back number: [phone]

## 2023-05-25 MED ORDER — fluticasone-umeclidin-vilanter (Trelegy Ellipta) 200-62.5-25 mcg blister with device
200-62.5-25 | Freq: Every day | RESPIRATORY_TRACT | 2 refills | 30.00000 days | Status: DC
Start: 2023-05-25 — End: 2023-05-30

## 2023-05-25 NOTE — Telephone Encounter (Signed)
 Patient is calling and states that patient missed a call from office.    No notes in Epic and no voicemail as left for patient     Please call patient back at [phone]

## 2023-05-25 NOTE — Telephone Encounter (Signed)
 Patient calling back to the office I explain the message about RX and where he has to go to pick up  his medication ,but he req a cal back from clinic , pt # [phone], thank you.

## 2023-05-25 NOTE — Telephone Encounter (Signed)
 Called patient, left VM   If patient calls back please let patient know that Dr Leveda Rea received a request for a refill of Trelegy from the pharmacy named: [facility] [address] [place], but we can't find this pharmacy.     However, Dr Leveda Rea send the RX to the nearest pharmacy which is [facility] #[id number] [facility], [place] - [address] AT [facility] [address]  [address], [place] [address]  Phone: [phone]  Fax: [phone]

## 2023-05-28 MED ORDER — LORazepam (Ativan) 1 mg tablet
1 | ORAL_TABLET | Freq: Two times a day (BID) | ORAL | 0 refills | 15.00000 days | Status: DC | PRN
Start: 2023-05-28 — End: 2023-05-28

## 2023-05-28 MED ORDER — LORazepam (Ativan) 1 mg tablet
1 | ORAL_TABLET | Freq: Two times a day (BID) | ORAL | 0 refills | 15.00000 days | Status: DC | PRN
Start: 2023-05-28 — End: 2023-07-03

## 2023-05-28 NOTE — Telephone Encounter (Signed)
 Sent to wrong pharmacy please resend to [facility] , patient no longer goes to [place]  .

## 2023-05-28 NOTE — Telephone Encounter (Signed)
 Department Name: primary care     Patient: Justin Navarro  MRN: [id number]  Agent: Bethene Brought    Clinical Access Center Scheduling Message    Patient called to request a refill of: patient out of medication   LORazepam (Ativan) 1 mg tablet 1 mg, 2 times daily PRN     patient stated you prescribe the 0.5mg  does take the 5mg  please  call patient to confirm        Pharmacy:  [phone] [facility] on [address] in [place]     Informed patient it can take 3 business days to send prescription refill to pharmacy.    Call back number: [phone]    Thank you

## 2023-05-28 NOTE — Telephone Encounter (Signed)
 PT OUT OF MED  Patient called to request a refill of: patient out of medication   LORazepam (Ativan) 1 mg tablet 1 mg, 2 times daily PRN      patient stated you prescribe the 0.5mg  does take the 5mg  please  call patient to confirm     Last OV with PCP: 04/12/2023    Next OV with PCP: 10/10/2023    Last HGBA1C:   HgbA1C (%)   Date Value   04/12/2023 6.4 (H)       Last BP:   BP Readings from Last 3 Encounters:   04/30/23 (!) 140/80   04/23/23 138/68   04/13/23 119/76       TSH:   TSH   Date Value Ref Range Status   02/06/2023 1.160 0.450 - 4.500 uIU/mL Final     Comment:     No apparent thyroid disorder. Additional testing not indicated. In  rare instances, Secondary Hypothyroidism as well as Subclinical  Hypothyroidism have been reported in some patients with normal TSH  values.         Last TOX screen (if applicable):02/06/23    Labs    Lab Results   Component Value Date    CALCIUM 9.0 04/12/2023    ALBUMIN 3.1 (L) 04/02/2023    NA 142 04/12/2023    K 4.8 04/12/2023    CO2 25 04/12/2023    CL 104 04/12/2023    BUN 21 04/12/2023    CREATININE 0.88 04/12/2023      AST   Date Value Ref Range Status   04/02/2023 35 6 - 42 U/L Final   02/06/2023 16 0 - 40 IU/L Final     ALT   Date Value Ref Range Status   04/02/2023 24 0 - 55 U/L Final   02/06/2023 11 0 - 44 IU/L Final     No components found for: "TBILI"  Hemoglobin   Date Value Ref Range Status   04/02/2023 13.2 13.0 - 17.5 g/dL Final     Hgb   Date Value Ref Range Status   04/12/2023 13.2 13.0 - 17.7 g/dL Final     Hematocrit   Date Value Ref Range Status   04/02/2023 40.4 37.0 - 53.0 % Final     Hct   Date Value Ref Range Status   04/12/2023 39.9 37.5 - 51.0 % Final     Platelets   Date Value Ref Range Status   04/12/2023 257 150 - 450 x10E3/uL Final   04/02/2023 173 150 - 400 K/uL Final     No results found for: "PTT"  No results found for: "INR"  No results found for: "PROTIME"  No results found for: "PTADJUSTED"

## 2023-05-28 NOTE — Telephone Encounter (Signed)
 Sheryle Donning from [facility] is asking that the Rx request received on 05/25/23 in media, be filled out manually and faxed back. Cost of med is 1/3 of the [place]  cost. It would be mailed to the patient if Rx is filled out and returned. If doctor will not send this, please call to let them know.   Cb [phone]    Thank you

## 2023-05-29 NOTE — Telephone Encounter (Signed)
 Justin Navarro , calling again from [facility]    Please do not escript, hard fax only.    Can someone call back Justin Navarro to confirm the fax.    Cb [phone]   Fax # [phone]

## 2023-05-29 NOTE — Telephone Encounter (Signed)
 Sheryle Donning calling again from [facility] is asking that the Rx request received on 05/25/23 in media, be filled out manually and faxed back. Cost of med is 1/3 of the [place]  cost. It would be mailed to the patient if Rx is filled out and returned. If doctor will not send this, please call to let them know. Sheryle Donning is very frustrated at the lack of response to this request.  States they will never be in the [place]  pharmacy [place] to fax to them. Patient can not afford the medication if filled in the [place] . The patient has requested their RX be filled via this method.   Cb [phone]     Thank you

## 2023-05-30 MED ORDER — fluticasone-umeclidin-vilanter (Trelegy Ellipta) 200-62.5-25 mcg blister with device
200-62.5-25 | Freq: Every day | RESPIRATORY_TRACT | 2 refills | 30.00000 days | Status: AC
Start: 2023-05-30 — End: ?

## 2023-06-04 ENCOUNTER — Other Ambulatory Visit: Admit: 2023-06-04 | Discharge: 2023-06-04 | Payer: MEDICARE | Primary: Internal Medicine

## 2023-06-04 DIAGNOSIS — C3492 Malignant neoplasm of unspecified part of left bronchus or lung: Secondary | ICD-10-CM

## 2023-06-04 LAB — CBC WITH DIFFERENTIAL
Basophils %: 0.2 %
Basophils Absolute: 0.01 10*3/uL (ref 0.00–0.22)
Eosinophils %: 4.1 %
Eosinophils Absolute: 0.26 10*3/uL (ref 0.00–0.50)
Hematocrit: 40.4 % (ref 37.0–53.0)
Hemoglobin: 13.1 g/dL (ref 13.0–17.5)
Immature Granulocytes %: 0.3 %
Immature Granulocytes Absolute: 0.02 10*3/uL (ref 0.00–0.10)
Lymphocyte %: 23.7 %
Lymphocytes Absolute: 1.5 10*3/uL (ref 0.70–4.00)
MCH: 27.6 pg (ref 26.0–34.0)
MCHC: 32.4 g/dL (ref 31.0–37.0)
MCV: 85.2 fL (ref 80.0–100.0)
MPV: 10.4 fL (ref 9.1–12.4)
Monocytes %: 9.3 %
Monocytes Absolute: 0.59 10*3/uL (ref 0.38–0.83)
NRBC %: 0 % (ref 0.0–0.0)
NRBC Absolute: 0 10*3/uL (ref 0.00–2.00)
Neutrophil %: 62.4 %
Neutrophils Absolute: 3.95 10*3/uL (ref 1.50–7.95)
Platelets: 180 10*3/uL (ref 150–400)
RBC: 4.74 M/uL (ref 4.20–5.90)
RDW-CV: 15.9 % — ABNORMAL HIGH (ref 11.5–14.5)
RDW-SD: 50 fL (ref 35.0–51.0)
WBC: 6.3 10*3/uL (ref 4.0–11.0)

## 2023-06-04 LAB — COMPREHENSIVE METABOLIC PANEL
ALT: <20 U/L (ref 0–55)
AST: 22 U/L (ref 22–42)
Albumin: 3.5 g/dL (ref 3.2–5.0)
Alkaline phosphatase: 58 U/L (ref 30–130)
Anion Gap: <1 mmol/L — ABNORMAL LOW (ref 3–14)
BUN: 10 mg/dL (ref 6–24)
Bilirubin, total: 1 mg/dL (ref 0.5–1.2)
CO2 (Bicarbonate): 32 mmol/L (ref 20–32)
Calcium: 9.3 mg/dL (ref 8.5–10.5)
Chloride: 110 mmol/L (ref 98–110)
Creatinine: 0.9 mg/dL (ref 0.55–1.30)
Glucose: 112 mg/dL (ref 70–139)
Potassium: 4.1 mmol/L (ref 3.6–5.2)
Protein, total: 6.6 g/dL (ref 6.0–8.4)
Sodium: 140 mmol/L (ref 135–146)
eGFRcr: 86 mL/min/{1.73_m2} (ref >=60)

## 2023-06-06 ENCOUNTER — Inpatient Hospital Stay: Admit: 2023-06-06 | Payer: MEDICARE | Primary: Internal Medicine

## 2023-06-06 DIAGNOSIS — C3492 Malignant neoplasm of unspecified part of left bronchus or lung: Secondary | ICD-10-CM

## 2023-06-07 ENCOUNTER — Inpatient Hospital Stay: Payer: MEDICARE | Primary: Internal Medicine

## 2023-06-07 NOTE — Telephone Encounter (Signed)
 Patient coming in on 5/14 to discuss results & next steps     Hold for visit

## 2023-06-08 ENCOUNTER — Inpatient Hospital Stay: Admit: 2023-06-08 | Payer: MEDICARE | Primary: Internal Medicine

## 2023-06-08 ENCOUNTER — Ambulatory Visit: Admit: 2023-06-08 | Discharge: 2023-06-08 | Payer: MEDICARE | Attending: Internal Medicine | Primary: Internal Medicine

## 2023-06-08 DIAGNOSIS — R609 Edema, unspecified: Secondary | ICD-10-CM

## 2023-06-08 MED ORDER — doxycycline (Vibramycin) 100 mg capsule
100 | ORAL_CAPSULE | Freq: Two times a day (BID) | ORAL | 0 refills | 10.00000 days | Status: AC
Start: 2023-06-08 — End: 2023-06-18

## 2023-06-08 MED ORDER — torsemide (Demadex) 5 mg tablet
5 | ORAL_TABLET | Freq: Every day | ORAL | 3 refills | 30.00000 days | Status: DC | PRN
Start: 2023-06-08 — End: 2023-06-13

## 2023-06-08 NOTE — Assessment & Plan Note (Signed)
 Followed by Dr Leveda Rea and Dr Harless Lien both

## 2023-06-08 NOTE — Telephone Encounter (Signed)
 Last OV with PCP: 06/08/2023    Next OV with PCP: 10/10/2023    Last HGBA1C:   HgbA1C (%)   Date Value   04/12/2023 6.4 (H)       Last BP:   BP Readings from Last 3 Encounters:   06/08/23 92/58   04/30/23 (!) 140/80   04/23/23 138/68       TSH:   TSH   Date Value Ref Range Status   02/06/2023 1.160 0.450 - 4.500 uIU/mL Final     Comment:     No apparent thyroid disorder. Additional testing not indicated. In  rare instances, Secondary Hypothyroidism as well as Subclinical  Hypothyroidism have been reported in some patients with normal TSH  values.         Last TOX screen (if applicable):    Labs    Lab Results   Component Value Date    CALCIUM 9.3 06/04/2023    ALBUMIN 3.5 06/04/2023    NA 140 06/04/2023    K 4.1 06/04/2023    CO2 32 06/04/2023    CL 110 06/04/2023    BUN 10 06/04/2023    CREATININE 0.90 06/04/2023      AST   Date Value Ref Range Status   06/04/2023 22 22 - 42 U/L Final   04/02/2023 35 6 - 42 U/L Final     ALT   Date Value Ref Range Status   06/04/2023 <20 0 - 55 U/L Final   04/02/2023 24 0 - 55 U/L Final     No components found for: "TBILI"  Hemoglobin   Date Value Ref Range Status   06/04/2023 13.1 13.0 - 17.5 g/dL Final     Hematocrit   Date Value Ref Range Status   06/04/2023 40.4 37.0 - 53.0 % Final     Platelets   Date Value Ref Range Status   06/04/2023 180 150 - 400 K/uL Final     No results found for: "PTT"  No results found for: "INR"  No results found for: "PROTIME"  No results found for: "PTADJUSTED"

## 2023-06-08 NOTE — Progress Notes (Signed)
 [facility] [place]  [address]  [place] [address]  Dept: [phone]  Dept Fax: [phone]     Patient ID: Justin Navarro is a 83 y.o. male who presents for Leg Swelling (6 months worsened last 2 weeks).    Subjective   HPI    Acute on chronic swelling   Of legs x 2-3 weeks    2014-left leg venous US    At [facility]    The RLE is swollen 2-3+   With some pinkness from the internal pressure    Left leg is 1-2 edematous    Lab Results   Component Value Date    HGBA1C 6.4 (H) 04/12/2023        Wt--240.5 pounds     Wt in March 229 pounds    Gained 11.5 pounds in 2 months    Patient Active Problem List   Diagnosis    Testicular hypofunction    Hx of adenomatous colonic polyps    Chronic obstructive pulmonary disease, unspecified (Multi-HCC)    Erectile dysfunction    GERD (gastroesophageal reflux disease)    History of gout    History of malignant neoplasm of skin    Mixed hyperlipidemia     Hypothyroidism     History of oral cancer    Other abnormal glucose    Actinic keratosis    Vitiligo    Chronic anxiety    Mass of upper lobe of left lung    History of bilateral cataract extraction    History of radiation therapy    History of cholecystectomy    History of ankle surgery    History of appendectomy    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 calories with serious comorbidity and body mass index (BMI) of 31.0 to 31.9 in adult    PAC (premature atrial contraction)    HI (head injury)    Steroid dependence (Multi-HCC)    Vitamin D deficiency    Prediabetes    Marijuana use    Urge incontinence of urine    Venous insufficiency    Cellulitis of right lower leg    History of lung cancer     Current Outpatient Medications   Medication Instructions    albuterol 90 mcg/actuation inhaler 2 puffs, inhalation, Every 4 hours PRN    atorvastatin (LIPITOR) 40 mg, oral, Daily    celecoxib (CELEBREX) 200 mg, oral, 2 times  daily    doxycycline (VIBRAMYCIN) 100 mg, oral, 2 times daily, Take with at least 8 ounces (large glass) of water, do not lie down for 30 minutes after    fluticasone-umeclidin-vilanter (Trelegy Ellipta) 200-62.5-25 mcg blister with device 1 Inhalation, inhalation, Daily, Rinse mouth after taking it.    levothyroxine (SYNTHROID, LEVOXYL) 112 mcg, oral, Daily    LORazepam (ATIVAN) 1 mg, oral, 2 times daily PRN    mirtazapine (REMERON) 15 mg, oral, Nightly    oxybutynin XL (DITROPAN XL) 5 mg, oral, Daily, Do not crush, chew, or split.    pantoprazole (PROTONIX) 40 mg, oral, Daily before breakfast, Do not crush, chew, or split.    predniSONE (DELTASONE) 10 mg, oral, Daily    roflumilast 250 mcg, oral, Daily    tamsulosin (FLOMAX) 0.4 mg, oral, Daily    torsemide (DEMADEX) 5 mg, oral, Daily PRN     Allergies   Allergen Reactions    Belsomra [Suvorexant] Other  Iodinated Contrast Media Hives    Penicillin G Hives     No past medical history on file.    No past surgical history on file.  No family history on file.  Social History     Socioeconomic History    Marital status: Divorced     Spouse name: Not on file    Number of children: Not on file    Years of education: Not on file    Highest education level: Not on file   Occupational History    Not on file   Tobacco Use    Smoking status: Former     Types: Cigarettes    Smokeless tobacco: Never   Substance and Sexual Activity    Alcohol use: Not Currently    Drug use: Yes     Types: Marijuana    Sexual activity: Not on file   Other Topics Concern    Not on file   Social History Narrative    Not on file     Social Determinants of Health     Financial Resource Strain: Not on file   Food Insecurity: Not on file   Transportation Needs: Not on file   Physical Activity: Not on file   Stress: Not on file   Social Connections: Not on file   Intimate Partner Violence: Not on file   Housing Stability: Not on file     Review of Systems  Medications Discontinued During This  Encounter   Medication Reason    torsemide (Demadex) 5 mg tablet Reorder     Outpatient Medications Prior to Visit   Medication Sig Dispense Refill    torsemide (Demadex) 5 mg tablet Take 5 mg by mouth if needed each day (edema).      albuterol 90 mcg/actuation inhaler Inhale 2 puffs every 4 (four) hours if needed for wheezing.      atorvastatin (Lipitor) 40 mg tablet Take 1 tablet (40 mg) by mouth once daily. 90 tablet 3    celecoxib (CeleBREX) 200 mg capsule Take 1 capsule (200 mg) by mouth twice daily. 180 capsule 3    fluticasone-umeclidin-vilanter (Trelegy Ellipta) 200-62.5-25 mcg blister with device Inhale 1 Inhalation once daily. Rinse mouth after taking it. 60 each 2    levothyroxine (Synthroid, Levoxyl) 112 mcg tablet Take 1 tablet (112 mcg) by mouth once daily. 90 tablet 3    LORazepam (Ativan) 1 mg tablet Take 1 tablet (1 mg) by mouth if needed in the morning and at bedtime for anxiety. 60 tablet 0    mirtazapine (Remeron) 15 mg tablet Take 1 tablet (15 mg) by mouth at bedtime. 90 tablet 3    oxybutynin XL (Ditropan XL) 5 mg 24 hr tablet Take 1 tablet (5 mg) by mouth once daily. Do not crush, chew, or split. 30 tablet 1    pantoprazole (ProtoNix) 40 mg EC tablet Take 1 tablet (40 mg) by mouth before breakfast. Do not crush, chew, or split. 90 tablet 3    predniSONE (Deltasone) 10 mg tablet Take 10 mg by mouth once daily.      roflumilast 250 mcg tablet Take 250 mcg by mouth once daily. 14 tablet 2    tamsulosin (Flomax) 0.4 mg 24 hr capsule Take 0.4 mg by mouth once daily.         Objective   Visit Vitals  BP 92/58 (BP Location: Left arm, Patient Position: Sitting, BP Cuff Size: Large adult)   Pulse 77   Temp 36.5 C (  97.7 F) (Temporal)   Wt 109.3 kg   SpO2 95%   BMI 32.69 kg/m   BSA 2.36 m     Physical Exam  Vitals reviewed.   Constitutional:       Appearance: Normal appearance. He is obese.   HENT:      Head: Normocephalic and atraumatic.      Right Ear: External ear normal.      Left Ear: External  ear normal.      Nose: Nose normal.      Mouth/Throat:      Mouth: Mucous membranes are moist.      Pharynx: Oropharynx is clear.   Eyes:      Extraocular Movements: Extraocular movements intact.      Conjunctiva/sclera: Conjunctivae normal.      Pupils: Pupils are equal, round, and reactive to light.   Cardiovascular:      Rate and Rhythm: Normal rate and regular rhythm.      Pulses: Normal pulses.      Heart sounds: Normal heart sounds.   Pulmonary:      Effort: Pulmonary effort is normal.      Breath sounds: Normal breath sounds.   Abdominal:      General: Abdomen is flat. Bowel sounds are normal.   Musculoskeletal:         General: Tenderness present.      Cervical back: Normal range of motion and neck supple.      Comments:  Some pinkness to RLE     2-3 plus edema    Left leg 1-2 plus edema      Can not rule out minimal cellulitis    Stiff in general   He has to rock back and forth to get   Some lift to raise himself from chair   Skin:     General: Skin is warm and dry.      Findings: Erythema present.      Comments:  Very mild erythema RLE   Neurological:      General: No focal deficit present.      Mental Status: He is alert and oriented to person, place, and time. Mental status is at baseline.   Psychiatric:         Mood and Affect: Mood normal.         Behavior: Behavior normal.         Thought Content: Thought content normal.         Judgment: Judgment normal.         Assessment/Plan   Pericles was seen today for leg swelling.  Edema, unspecified type  -     Vascular US  lower extremity venous duplex bilateral; Future  -     torsemide (Demadex) 5 mg tablet; Take 1 tablet (5 mg) by mouth if needed each day (edema).  -     Basic metabolic panel; Standing  Cellulitis of right lower leg  Assessment & Plan:  Mild in nature start doxy 100mg m po bid   To have precaution have booked bilateral venous US    Today to rule out DVT at [facility]  Orders:  -     doxycycline (Vibramycin) 100 mg capsule; Take 1 capsule  (100 mg) by mouth twice daily for 10 days. Take with at least 8 ounces (large glass) of water, do not lie down for 30 minutes after  Venous insufficiency  Assessment & Plan:   Both legs edematous   Demadex 5 mg po qd  prn edema   Started    Watch for low blood pressure and dizziness   Have BMP done at hospital in next few days after taking demadex  History of lung cancer  Assessment & Plan:  Followed by Dr Leveda Rea and Dr Harless Lien both

## 2023-06-08 NOTE — Assessment & Plan Note (Signed)
 Mild in nature start doxy 100mg m po bid   To have precaution have booked bilateral venous US    Today to rule out DVT at [facility]

## 2023-06-08 NOTE — Telephone Encounter (Signed)
Pt called to check on refill status 

## 2023-06-08 NOTE — Assessment & Plan Note (Signed)
 Both legs edematous   Demadex 5 mg po qd prn edema   Started    Watch for low blood pressure and dizziness   Have BMP done at hospital in next few days after taking demadex

## 2023-06-11 NOTE — Telephone Encounter (Signed)
 Last OV with PCP: 06/08/2023    Next OV with PCP: 10/10/2023    Last HGBA1C:   HgbA1C (%)   Date Value   04/12/2023 6.4 (H)       Last BP:   BP Readings from Last 3 Encounters:   06/08/23 92/58   04/30/23 (!) 140/80   04/23/23 138/68       TSH:   TSH   Date Value Ref Range Status   02/06/2023 1.160 0.450 - 4.500 uIU/mL Final     Comment:     No apparent thyroid disorder. Additional testing not indicated. In  rare instances, Secondary Hypothyroidism as well as Subclinical  Hypothyroidism have been reported in some patients with normal TSH  values.         Last TOX screen (if applicable):    Labs    Lab Results   Component Value Date    CALCIUM 9.3 06/04/2023    ALBUMIN 3.5 06/04/2023    NA 140 06/04/2023    K 4.1 06/04/2023    CO2 32 06/04/2023    CL 110 06/04/2023    BUN 10 06/04/2023    CREATININE 0.90 06/04/2023      AST   Date Value Ref Range Status   06/04/2023 22 22 - 42 U/L Final   04/02/2023 35 6 - 42 U/L Final     ALT   Date Value Ref Range Status   06/04/2023 <20 0 - 55 U/L Final   04/02/2023 24 0 - 55 U/L Final     No components found for: "TBILI"  Hemoglobin   Date Value Ref Range Status   06/04/2023 13.1 13.0 - 17.5 g/dL Final     Hematocrit   Date Value Ref Range Status   06/04/2023 40.4 37.0 - 53.0 % Final     Platelets   Date Value Ref Range Status   06/04/2023 180 150 - 400 K/uL Final     No results found for: "PTT"  No results found for: "INR"  No results found for: "PROTIME"  No results found for: "PTADJUSTED"

## 2023-06-11 NOTE — Telephone Encounter (Signed)
-----   Message from Dr. Louvella Royalty, MD sent at 06/09/2023  8:43 AM EDT -----  Regarding: last Friday negative US  for DVT  Result TextProcedure: VASC US  LOWER EXTREMITY VENOUS DUPLEX BILATERAL  06/08/2023 1:55 PMIndications: 82 years Male edema Rt greater than LtComparison: None available Technique:  Color Doppler and spectral duplex imaging was performed to assess vascularity and flow. Findings:  The bilateral common femoral, proximal great saphenous, femoral, deep femoral, and popliteal veins demonstrate normal compressibility and color-flow. No evidence of deep venous thrombosis. No abnormal fluid collection or Baker's cyst. IMPRESSION:No evidence of bilateral lower extremity DVT.  He can also contact the Dr Almarie Arias vascular group on [address]  if he wants to also discuss ablation therapy for edemaAblation for venous stasis edema is a treatment approach aimed at improving circulation in the legs by closing off malfunctioning veins that contribute to chronic venous insufficiency-a common cause of venous stasis edema (swelling in the lower legs due to poor venous return).Understanding the Problem:Venous stasis edema occurs when the valves in the leg veins are weak or damaged, allowing blood to pool in the lower extremities instead of returning efficiently to the heart. This can lead ZO:XWRUEAV swelling, especially around the anklesSkin changes (darkening, thickening, or ulceration)Discomfort or heaviness in the legsWhat is Venous Ablation?Venous ablation is a minimally invasive procedure used to treat chronic venous insufficiency. It involves closing off the faulty vein (usually a superficial vein like the great or small saphenous vein), so that blood is rerouted through healthier veins.There are a few methods:Endovenous laser ablation (EVLA)Radiofrequency ablation (RFA)Chemical ablation (sclerotherapy) - for smaller veins or when heat-based methods are not idealHow It Helps with Edema:By eliminating the  incompetent vein:Venous pressure is reduced, decreasing leakage of fluid into surrounding tissues.Swelling often improves significantly.Skin health and healing of any ulcers may also improve.Candidacy:Ablation is usually considered WU:JWJXB is confirmed venous reflux on ultrasoundConservative treatments (e.g., compression stockings, elevation, exercise) haven't provided enough reliefThe patient has symptomatic edema or skin changesConsiderations:Usually performed in-office with local anesthesiaMost people return to normal activity within a few daysWearing compression stockings after the procedure is recommendedRisks include bruising, nerve irritation, or rarely, blood clotsSummary:

## 2023-06-11 NOTE — Telephone Encounter (Signed)
 Letter was sent

## 2023-06-13 ENCOUNTER — Ambulatory Visit
Admit: 2023-06-13 | Discharge: 2023-06-13 | Payer: MEDICARE | Attending: Hematology & Oncology | Primary: Internal Medicine

## 2023-06-13 DIAGNOSIS — C3492 Malignant neoplasm of unspecified part of left bronchus or lung: Secondary | ICD-10-CM

## 2023-06-13 MED ORDER — torsemide (Demadex) 5 mg tablet
5 | ORAL_TABLET | Freq: Every day | ORAL | 3 refills | 30.00000 days | Status: DC | PRN
Start: 2023-06-13 — End: 2023-06-13

## 2023-06-13 MED ORDER — torsemide (Demadex) 5 mg tablet
5 | ORAL_TABLET | Freq: Every day | ORAL | 3 refills | 30.00000 days | Status: DC | PRN
Start: 2023-06-13 — End: 2023-07-25

## 2023-06-13 NOTE — Telephone Encounter (Signed)
 Patient is calling in stating that both legs are very swollen and he was prescribed torsemide. Patient wants to know if he can take 2 pills instead of 1 to see if that works better. Please advise    Best call back number is  [phone]

## 2023-06-13 NOTE — Telephone Encounter (Signed)
 Reviewed CT result with pt and DIL.   Pt would like to hold of on PET for now.   Repeat CT 9/11  Follow up with dr. Pennacchio 9/15

## 2023-06-13 NOTE — Progress Notes (Signed)
 Cancer Center Follow Up Visit Note -  Adult      MRN: [id number] Patient Name: Justin Navarro     DOB:  04/16/1940 Gender: male    AGE:   83 y.o. Encounter Date: 06/13/2023     Diagnosis:  1. 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 History of radiation therapy    3. History of cigarette smoking    4. Pulmonary nodules    5. Centrilobular emphysema (Multi-HCC)         Cancer Staging   No matching staging information was found for the patient.      Primary Language:   English    Interval History:  Justin Navarro is a 83 y.o. male who comes to the clinic for follow up. The patient has previously been treated in [place] . In October he decided to leave [place]  and moved back to [place].    Summary of Past Treatment:      There is a history of pulmonary nodules for which the patient was previously evaluated at [facility] with a bronchoscopy/EBUS in December 2021.  At that time the patient had a biopsy of a  left upper lobe lung nodule which was negative for malignancy.  In the fall of 2023 he had further evaluation while in [place]  and  underwent a PET scan on 12/02/2021 which showed a centrally located left upper lobe mass at the level of the superior left hilum.  This mass was enlarged compared to a CT scan of the chest done  on 12/08/2020.  There was no evidence of lymphadenopathy or distant metastases.   On 01/02/2022 the patient had a bronchoscopy and biopsy of the left upper lobe lung mass with pathology negative for malignancy.  A follow-up CT scan in March 2024 again showed a left upper lobe mass like opacity measuring 4 x 2.1 cm.  A repeat CT scan on August 15, 2022 showed a 3.3 x 2 cm spiculated nodule in the left upper lobe which was slightly enlarged from the previous CT.  In addition there was a 4 mm and a 6 mm nodule in the right lower lobe.  The patient underwent bronchoscopy with biopsy of the left upper lobe lung mass on 08/21/2022 and pathology was positive for squamous cell  carcinoma.  The patient underwent a PET scan on 09/19/2022 which showed abnormal uptake in the left upper lobe mass near the hilum.  Subcentimeter nodules in the right lower lobe did not show FDG uptake. There was no evidence of additional FDG uptake to suggest metastatic disease.  The patient declined chemotherapy and was treated with stereotactic radiotherapy to the left upper lobe mass while in [place] .  The patient has significant underlying COPD and sees Dr. Leveda Rea.  He has been diagnosed with centrilobular emphysema. The patient has required prednisone for COPD exacerbations ,most recently in March 2025.   The patient states that over the past few months he has noted worsening edema of the lower extremities and has been started on torsemide 5mg  daily.  The patient has a chronic cough with chest congestion. He has had several courses of antibiotics over the past several months.    Allergies   Allergen Reactions    Belsomra [Suvorexant] Other    Iodinated Contrast Media Hives    Penicillin G Hives       Current Outpatient Medications   Medication Instructions    albuterol 90 mcg/actuation inhaler 2 puffs, inhalation, Every 4 hours  PRN    atorvastatin (LIPITOR) 40 mg, oral, Daily    celecoxib (CELEBREX) 200 mg, oral, 2 times daily    doxycycline (VIBRAMYCIN) 100 mg, oral, 2 times daily, Take with at least 8 ounces (large glass) of water, do not lie down for 30 minutes after    fluticasone-umeclidin-vilanter (Trelegy Ellipta) 200-62.5-25 mcg blister with device 1 Inhalation, inhalation, Daily, Rinse mouth after taking it.    levothyroxine (SYNTHROID, LEVOXYL) 112 mcg, oral, Daily    LORazepam (ATIVAN) 1 mg, oral, 2 times daily PRN    mirtazapine (REMERON) 15 mg, oral, Nightly    oxybutynin XL (DITROPAN XL) 5 mg, oral, Daily, Do not crush, chew, or split.    pantoprazole (PROTONIX) 40 mg, oral, Daily before breakfast, Do not crush, chew, or split.    predniSONE (Deltasone) 10 mg tablet TAKE 5 TABLETS DAILY X 2 DAYS, 4  TABLETS DAILY X 2 DAYS, 3 TABLETS DAILY X 2 DAYS, 2 TABLETS DAILY X 2 DAYS THEN 1 TABLET DAILY X 2 DAYS    roflumilast 250 mcg, oral, Daily    tamsulosin (FLOMAX) 0.4 mg, oral, Daily    torsemide (DEMADEX) 5 mg, oral, Daily PRN       No past medical history on file.    Patient Active Problem List   Diagnosis    Testicular hypofunction    Hx of adenomatous colonic polyps    Chronic obstructive pulmonary disease, unspecified (Multi-HCC)    Erectile dysfunction    GERD (gastroesophageal reflux disease)    History of gout    History of malignant neoplasm of skin    Mixed hyperlipidemia     Hypothyroidism     History of oral cancer    Other abnormal glucose    Actinic keratosis    Vitiligo    Chronic anxiety    Mass of upper lobe of left lung    History of bilateral cataract extraction    History of radiation therapy    History of cholecystectomy    History of ankle surgery    History of appendectomy    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    No family history on file.    Social History     Tobacco Use    Smoking status: Former     Types: Cigarettes    Smokeless tobacco: Never   Substance Use Topics    Alcohol use: Not Currently    Drug use: Yes     Types: Marijuana       Review of Systems   All other systems reviewed and are negative.       Vitals:    06/13/23 1430   BP: 119/72   BP Location: Left arm   Patient Position: Sitting   Pulse: 86   Resp: 22   Temp: 36.2 C (97.2 F)   TempSrc: Temporal   SpO2: 96%   Weight: 106.9 kg  PainSc: 0-No pain            04/02/2023    11:45 AM 04/02/2023    12:30 PM  04/12/2023    11:36 AM 04/13/2023    11:51 AM 04/23/2023     9:50 AM 04/30/2023    11:09 AM 06/08/2023    11:43 AM   Vitals   Systolic  104 100 119 138 140 92   Diastolic  82 62 76 68 80 58   Pulse 85 87 79 66 68 85 77   Temp  36.8 C (98.2 F) 36.1 C (96.9 F) 35.7 C (96.2 F)   36.5 C (97.7 F)   Resp 30 19        Height (in)   1.829 m  1.791 m 1.829 m    Weight (lb)   229.2  230 240 241   SpO2 97 % 94 % 93 % 96 %   95 %   BMI   31.08 kg/m2  32.54 kg/m2 32.55 kg/m2 32.69 kg/m2   BSA (m2)   2.3 m2  2.28 m2 2.35 m2 2.36 m2   Visit Report   Report Report Report  Report         Last Documented Distress Screening Result:       Performance Status:  0 - Asymptomatic    Physical Exam  Constitutional:       Appearance: Normal appearance. He is obese.   HENT:      Head: Normocephalic.      Nose: Nose normal.   Eyes:      Extraocular Movements: Extraocular movements intact.      Conjunctiva/sclera: Conjunctivae normal.   Cardiovascular:      Rate and Rhythm: Normal rate and regular rhythm.      Heart sounds: Normal heart sounds.   Pulmonary:      Effort: Pulmonary effort is normal.      Breath sounds: Normal breath sounds.   Abdominal:      General: Abdomen is flat.      Palpations: Abdomen is soft.      Comments: No hepatomegaly, no splenomegaly   Musculoskeletal:         General: Normal range of motion.      Right lower leg: Edema present.      Left lower leg: Edema present.   Lymphadenopathy:      Cervical: No cervical adenopathy.   Skin:     Findings: No lesion or rash.   Neurological:      General: No focal deficit present.      Mental Status: He is oriented to person, place, and time.   Psychiatric:         Mood and Affect: Mood normal.         Lab Tests Reviewed:      No results found for this or any previous visit (from the past 24 hours).  Recent Results (from the past 4 weeks)   Comprehensive metabolic panel    Collection Time: 06/04/23  2:48 PM   Result Value Ref Range    Sodium 140 135 - 146 mmol/L    Potassium 4.1  3.6 - 5.2 mmol/L    Chloride 110 98 - 110 mmol/L    CO2 (Bicarbonate) 32 20 - 32 mmol/L    Anion Gap <1 (L) 3 - 14 mmol/L    BUN 10 6 - 24 mg/dL    Creatinine 6.57 8.46 - 1.30 mg/dL    eGFRcr 86 >=  60 mL/min/1.4m*2    Glucose 112 70 - 139 mg/dL    Fasting? Unknown     Calcium 9.3 8.5 - 10.5 mg/dL    AST 22 22 - 42 U/L    ALT <20 0 - 55 U/L    Alkaline phosphatase 58 30 - 130 U/L    Protein, total 6.6 6.0 - 8.4 g/dL    Albumin 3.5 3.2 - 5.0 g/dL    Bilirubin, total 1.0 0.5 - 1.2 mg/dL   CBC w/ Differential    Collection Time: 06/04/23  2:48 PM   Result Value Ref Range    WBC 6.3 4.0 - 11.0 K/uL    RBC 4.74 4.20 - 5.90 M/uL    Hemoglobin 13.1 13.0 - 17.5 g/dL    Hematocrit 51.8 84.1 - 53.0 %    MCV 85.2 80.0 - 100.0 fL    MCH 27.6 26.0 - 34.0 pg    MCHC 32.4 31.0 - 37.0 g/dL    RDW-CV 66.0 (H) 63.0 - 14.5 %    RDW-SD 50.0 35.0 - 51.0 fL    Platelets 180 150 - 400 K/uL    MPV 10.4 9.1 - 12.4 fL    Neutrophil % 62.4 %    Lymphocyte % 23.7 %    Monocytes % 9.3 %    Eosinophils % 4.1 %    Basophils % 0.2 %    Immature Granulocytes % 0.3 %    NRBC % 0.0 0.0 - 0.0 %    Neutrophils Absolute 3.95 1.50 - 7.95 K/uL    Lymphocytes Absolute 1.50 0.70 - 4.00 K/uL    Monocytes Absolute 0.59 0.38 - 0.83 K/uL    Eosinophils Absolute 0.26 0.00 - 0.50 K/uL    Basophils Absolute 0.01 0.00 - 0.22 K/uL    Immature Granulocytes Absolute 0.02 0.00 - 0.10 K/uL    NRBC Absolute 0.00 0.00 - 2.00 K/uL       Imaging Reviewed:      Pathology Reviewed:  No results found for this or any previous visit.    Assessment & Plan:  1. 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 History of radiation therapy    3. History of cigarette smoking    4. Pulmonary nodules    5. Centrilobular emphysema (Multi-HCC)      Jenna is an 83 year old male with a history of squamous cell carcinoma of the left upper lobe who has had previous treatment with SBRT in [place] .  T Patient has moved to [place] from [place] .      The patient has had symptoms of a  chronic cough and he recently completed a course of doxycycline and prednisone.  During today's visit the patient's daughter-in-law was on the phone to participate in our discussion.  Patient has had a recent chest CT scan which Shows a more confluent appearance of a masslike area of consolidation in the left upper lobe which corresponds to the previously treated lung cancer.  There are multiple right-sided pulmonary nodules the largest measured 1.1 and 1.6 cm at the right lung base.    I had a discussion with the patient regarding the CT findings.  I suggested a PET scan to further characterize the right lung pulmonary nodules.  Findings in the left lung are consistent with posttreatment changes related to the patient's SBRT.  The patient would like to hold off on a PET scan.  The patient has declined systemic therapy i.e. chemotherapy.  There is a concern  about having a lung biopsy given his significant COPD.  Our plan will be to repeat a chest CT scan in 4 months. If there is further increase in the size of the right-sided pulmonary nodules we will plan a PET scan at that time.  In the meantime the patient will follow up with Dr. Lupe Salk regarding his leg edema.  He also sees Dr. Leveda Rea  for his COPD.  I will see the patient in follow-up in 4 months.    Total time spent on today's visit was 30 minutes.

## 2023-06-13 NOTE — Telephone Encounter (Signed)
 LMTCB    Did he get the letter to see a vascular MD- Dr. Almarie Arias.

## 2023-06-14 NOTE — Telephone Encounter (Signed)
 Dr Lupe Salk increase medication phone call complete

## 2023-06-22 ENCOUNTER — Inpatient Hospital Stay: Admit: 2023-06-22 | Payer: MEDICARE | Primary: Internal Medicine

## 2023-06-22 DIAGNOSIS — I2510 Atherosclerotic heart disease of native coronary artery without angina pectoris: Secondary | ICD-10-CM

## 2023-06-22 NOTE — Telephone Encounter (Signed)
 Called and relayed overall echocardiogram results.

## 2023-07-02 NOTE — Telephone Encounter (Signed)
 Last OV with PCP: 06/08/2023    Next OV with PCP: 10/10/2023    Last HGBA1C:   HgbA1C (%)   Date Value   04/12/2023 6.4 (H)       Last BP:   BP Readings from Last 3 Encounters:   06/13/23 119/72   06/08/23 92/58   04/30/23 (!) 140/80       TSH:   TSH   Date Value Ref Range Status   02/06/2023 1.160 0.450 - 4.500 uIU/mL Final     Comment:     No apparent thyroid disorder. Additional testing not indicated. In  rare instances, Secondary Hypothyroidism as well as Subclinical  Hypothyroidism have been reported in some patients with normal TSH  values.         Last TOX screen (if applicable):    Labs    Lab Results   Component Value Date    CALCIUM 9.3 06/04/2023    ALBUMIN 3.5 06/04/2023    NA 140 06/04/2023    K 4.1 06/04/2023    CO2 32 06/04/2023    CL 110 06/04/2023    BUN 10 06/04/2023    CREATININE 0.90 06/04/2023      AST   Date Value Ref Range Status   06/04/2023 22 22 - 42 U/L Final   04/02/2023 35 6 - 42 U/L Final     ALT   Date Value Ref Range Status   06/04/2023 <20 0 - 55 U/L Final   04/02/2023 24 0 - 55 U/L Final     No components found for: "TBILI"  Hemoglobin   Date Value Ref Range Status   06/04/2023 13.1 13.0 - 17.5 g/dL Final     Hematocrit   Date Value Ref Range Status   06/04/2023 40.4 37.0 - 53.0 % Final     Platelets   Date Value Ref Range Status   06/04/2023 180 150 - 400 K/uL Final     No results found for: "PTT"  No results found for: "INR"  No results found for: "PROTIME"  No results found for: "PTADJUSTED"

## 2023-07-03 NOTE — Progress Notes (Unsigned)
 [facility]  [address]  [place] [address]  Dept: [phone]  Dept Fax: [phone]     Patient ID: Justin Navarro is a 83 y.o. male who presents for No chief complaint on file..    Subjective   HPI  Patient Active Problem List   Diagnosis    Testicular hypofunction    Hx of adenomatous colonic polyps    Chronic obstructive pulmonary disease, unspecified (Multi-HCC)    Erectile dysfunction    GERD (gastroesophageal reflux disease)    History of gout    History of malignant neoplasm of skin    Mixed hyperlipidemia     Hypothyroidism     History of oral cancer    Other abnormal glucose    Actinic keratosis    Vitiligo    Chronic anxiety    Mass of upper lobe of left lung    History of bilateral cataract extraction    History of radiation therapy    History of cholecystectomy    History of ankle surgery    History of appendectomy    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  No past surgical history on file.  No family history on file.  Social History     Socioeconomic History    Marital status: Divorced     Spouse name: Not on file    Number of children: Not on file    Years of education: Not on file    Highest education level: Not on file   Occupational History    Not on file   Tobacco Use    Smoking status: Former     Types: Cigarettes    Smokeless tobacco: Never   Substance and Sexual Activity    Alcohol use:  Not Currently    Drug use: Yes     Types: Marijuana    Sexual activity: Not on file   Other Topics Concern    Not on file   Social History Narrative    Not on file     Social Determinants of Health     Financial Resource Strain: Not on file   Food Insecurity: Not on file   Transportation Needs: Not on file   Physical Activity: Not on file   Stress: Not on file   Social Connections: Not on file   Intimate Partner Violence: Not on file   Housing Stability: Not on file          Objective   There were no vitals taken for this visit.  Physical Exam    Assessment/Plan   There are no diagnoses linked to this encounter.

## 2023-07-23 NOTE — Telephone Encounter (Addendum)
 Patient states that patient has 5 pills left as patient has been taking about 2 or 3 pills some days - patients to increase dosage    [facility] Scheduling Message    Patient called to request a refill of:  [MEDICATION NAMES HERE]      torsemide  (Demadex ) 5 mg tablet       Pharmacy:    [id number] - [place], [place] - [address]       Informed patient it can take 3 business days to send prescription refill to pharmacy.    Call back number: [phone]

## 2023-07-23 NOTE — Progress Notes (Signed)
 [facility]  [address]  [place] [address]  Dept: [phone]  Dept Fax: [phone]     Patient ID: Justin Navarro is a 83 y.o. male who presents for No chief complaint on file..    Subjective   HPI  Patient Active Problem List   Diagnosis    Testicular hypofunction    Hx of adenomatous colonic polyps    Chronic obstructive pulmonary disease, unspecified (Multi-HCC)    Erectile dysfunction    GERD (gastroesophageal reflux disease)    History of gout    History of malignant neoplasm of skin    Mixed hyperlipidemia     Hypothyroidism     History of oral cancer    Other abnormal glucose    Actinic keratosis    Vitiligo    Chronic anxiety    Mass of upper lobe of left lung    History of bilateral cataract extraction    History of radiation therapy    History of cholecystectomy    History of ankle surgery    History of appendectomy    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  No past surgical history on file.  No family history on file.  Social History     Socioeconomic History    Marital status: Divorced     Spouse name: Not on file    Number of children: Not on file    Years of education: Not on file    Highest education level: Not on file   Occupational History    Not on file   Tobacco Use    Smoking status: Former     Types: Cigarettes    Smokeless tobacco: Never   Substance and Sexual Activity    Alcohol use:  Not Currently    Drug use: Yes     Types: Marijuana    Sexual activity: Not on file   Other Topics Concern    Not on file   Social History Narrative    Not on file     Social Determinants of Health     Financial Resource Strain: Not on file   Food Insecurity: Not on file   Transportation Needs: Not on file   Physical Activity: Not on file   Stress: Not on file   Social Connections: Not on file   Intimate Partner Violence: Not on file   Housing Stability: Not on file          Objective   There were no vitals taken for this visit.  Physical Exam    Assessment/Plan   There are no diagnoses linked to this encounter.

## 2023-07-24 DIAGNOSIS — R399 Unspecified symptoms and signs involving the genitourinary system: Secondary | ICD-10-CM

## 2023-07-24 NOTE — Telephone Encounter (Signed)
 Informed pt lab orders are in and will need stat labs to increase med doseage

## 2023-07-25 ENCOUNTER — Ambulatory Visit
Admit: 2023-07-25 | Discharge: 2023-07-25 | Payer: MEDICARE | Attending: Student in an Organized Health Care Education/Training Program | Primary: Internal Medicine

## 2023-07-25 ENCOUNTER — Ambulatory Visit: Admit: 2023-07-25 | Discharge: 2023-07-25 | Payer: MEDICARE | Attending: Surgery | Primary: Internal Medicine

## 2023-07-25 DIAGNOSIS — R399 Unspecified symptoms and signs involving the genitourinary system: Secondary | ICD-10-CM

## 2023-07-25 DIAGNOSIS — Z6831 Body mass index (BMI) 31.0-31.9, adult: Secondary | ICD-10-CM

## 2023-07-25 LAB — BASIC METABOLIC PANEL
Anion Gap: 15 mmol/L (ref 10.0–18.0)
BUN/Creat Ratio: 14 (ref 10–24)
BUN: 13 mg/dL (ref 8–27)
Calcium: 9.4 mg/dL (ref 8.6–10.2)
Carbon Dioxide: 23 mmol/L (ref 20–29)
Chloride: 106 mmol/L (ref 96–106)
Creat: 0.95 mg/dL (ref 0.76–1.27)
Glucose: 112 mg/dL — ABNORMAL HIGH (ref 70–99)
Potassium: 4.1 mmol/L (ref 3.5–5.2)
Sodium: 144 mmol/L (ref 134–144)
eGFR: 80 mL/min/{1.73_m2} (ref >59)

## 2023-07-25 LAB — CBC W/DIFF
Baso Abs: 0 10*3/uL (ref 0.0–0.2)
Basos: 0 %
Eos Abs: 0.3 10*3/uL (ref 0.0–0.4)
Eos: 5 %
Hct: 40.9 % (ref 37.5–51.0)
Hgb: 12.9 g/dL — ABNORMAL LOW (ref 13.0–17.7)
Immature Grans Abs: 0 10*3/uL (ref 0.0–0.1)
Immature Granulocytes: 0 %
Lymphs Abs: 1.3 10*3/uL (ref 0.7–3.1)
Lymphs: 25 %
MCH: 27.6 pg (ref 26.6–33.0)
MCHC: 31.5 g/dL (ref 31.5–35.7)
MCV: 88 fL (ref 79–97)
Monocytes: 12 %
MonocytesAbs: 0.6 10*3/uL (ref 0.1–0.9)
Neutrophils Abs: 2.9 10*3/uL (ref 1.4–7.0)
Neutrophils: 58 %
Platelets: 189 10*3/uL (ref 150–450)
RBC: 4.67 x10E6/uL (ref 4.14–5.80)
RDW: 14.3 % (ref 11.6–15.4)
WBC: 5 10*3/uL (ref 3.4–10.8)

## 2023-07-25 LAB — POCT URINALYSIS DIPSTICK
Bilirubin, UA, POC: NEGATIVE
Blood, UA, POC: NEGATIVE
Glucose, UA, POC: NEGATIVE
Ketones, UA, POC: NEGATIVE
Leukocytes, UA, POC: NEGATIVE
Nitrite, UA, POC: NEGATIVE
Protein, UA, POC: NEGATIVE
Spec Gravity, UA, POC: 1.015 (ref 1.001–1.035)
Urobilinogen, UA, POC: NORMAL
pH, UA, POC: 6 (ref 5.0–8.0)

## 2023-07-25 LAB — NT PRO BNP: NT-proBNP: 128 pg/mL (ref 0–486)

## 2023-07-25 LAB — HEMOGLOBIN A1C: HgbA1C: 6 % — ABNORMAL HIGH (ref 4.8–5.6)

## 2023-07-25 MED ORDER — torsemide (Demadex) 5 mg tablet
5 | ORAL_TABLET | Freq: Every day | ORAL | 3 refills | 30.00000 days | Status: DC | PRN
Start: 2023-07-25 — End: 2023-07-25

## 2023-07-25 MED ORDER — torsemide (Demadex) 5 mg tablet
5 | ORAL_TABLET | Freq: Every day | ORAL | 3 refills | 30.00000 days | Status: AC | PRN
Start: 2023-07-25 — End: 2024-07-24

## 2023-07-25 NOTE — Progress Notes (Signed)
 [facility]  [facility]  [address]  [address]  [place] [address]  Dept: [phone]  Dept Fax: [phone]        Reason for Visit:  N/P for LUTS    Urology Issues:  LUTS with urge incontinence    History of present illness  Justin Navarro is a 83 y.o. male with significant medical history as listed below, he was referred by Norleen Candle, MD for my opinion for LUTS    - LUTS: Since 2021. Most bothersome symptoms are urgency, and incontinence with leakage. He is on tamsulosin. PSA in 01/2023 measured 0.60.   He is taking Flomax and Oxybutynin  which controlled his incontinence very well. Currently, he feels well in his voiding.    Patient denies gross hematuria, fever/chills, abdominal/flank  pain, urgency/frequency, weak urine stream, dysuria, appetite changes, new bony pain, unintentional weight loss or fatigue.      Problem List[1]     Surgical History[2]    Medications Ordered Prior to Encounter[3]    Allergies[4]    Social History     Socioeconomic History    Marital status: Divorced     Spouse name: Not on file    Number of children: Not on file    Years of education: Not on file    Highest education level: Not on file   Occupational History    Not on file   Tobacco Use    Smoking status: Former     Types: Cigarettes    Smokeless tobacco: Never   Substance and Sexual Activity    Alcohol use: Not Currently    Drug use: Yes     Types: Marijuana    Sexual activity: Not on file   Other Topics Concern    Not on file   Social History Narrative    Not on file     Social Determinants of Health     Financial Resource Strain: Not on file   Food Insecurity: Not on file   Transportation Needs: Not on file   Physical Activity: Not on file   Stress: Not on file   Social Connections: Not on file   Intimate Partner Violence: Not on file   Housing Stability: Not on file       Family History[5]    Review of symptoms  Constitutional: No fevers, chills, weight loss or general weakness.    Head: No headache or dizziness. Eyes: No loss of vision or double vision.   Ears: No hearing loss, tinnitus, otalgia, otorrhea. Nose: No nasal discharge.   Throat: No hoarseness or difficulty swallowing.   Integumentary: No rash or skin lesions. No changes in hair or nail character.   Lungs: No shortness of breath. No new, frequent cough.   Cardiovascular: No chest pain or palpitations.   Gastrointestinal: No abdominal pain, nausea, vomiting or change in bowel movements.   Genitourinary: No dysuria or urethral discharge.   Musculoskeletal: No joint/muscle pain or swelling. Normal gait and mobility.   Neurological: No seizures, light headedness, memory loss or numbness.   Psychiatry: No mood or behavioral changes. No anxiety or depression.   Endocrine: No hirsutism, excessive hair loss/alopecia. No polyuria or polydipsia.   Hematologic/Lymphatic: No easy bruising, bleeding or enlarged lymph nodes.   Allergic/Immunologic: No environmental or food allergies.     Objective :  Physical Exam  Vitals:    07/25/23 0850   BP: 124/73   Pulse: 89   SpO2: 95%  Urology PE:   General Appearance: Well-developed, well nourished, alert and cooperative, NAD, normal secondary sexual characteristics.   HEENT: Normo-cephalic, atraumatic, PERRLA, EOMI, anicteric, vision grossly intact, no nasal discharge. Sinuses, non-tender. Oropharynx no exudate or lesions.   Skin: Normal color, texture, turgor, with no lesions, eruptions, rashes, bruises or petechiae.   Neck: Supple, no masses. Normal ROM. Trachea is in midline. No thyromegaly.   Chest Normal AP diameter. No rib tenderness.   Lungs: Normal respirations, symmetric excursion with no accessory muscle use.   Back: no CAVT Vertebral column aligned no scoliosis or kyphosis. No masses or tenderness.   Cardiovascular: RRR. No murmur, pulses are intact.   Abdomen: Soft, benign, non-tender, non-distended, no palpable masses, no hepato-splenomegaly, no hernias. Bladder is not palpable.    Extremities: No clubbing, cyanosis or pitting edema.   Musculo-Skeletal: No muscle wasting, joint swelling or tenderness.   Lymphatic: No cervical, axillary or inguinal adenopathy.   Neurologic: Oriented 3x with no focal deficits.     GU Male:   Genitalia: Normally developed male genitalia. Uncirc'd. BXO at edge of phimosis with skin cracks, not a  Phallus: No lesions or chordee.   Glans: No lesions or inflammation.   Meatus: No discharge.   Scrotum: No mass or tenderness. No hernias, hydroceles or varicoceles, no Lymphadenopathy.  Spermatic Cords: No masses, induration or tenderness.   Epididymis: No masses, tenderness or induration, able to palpate vasa on both sides. Left epididymal cyst about 1.5 cm appreciated without tenderness.  Testes: Normal size and consistency. No masses, asymmetry, tenderness or atrophy.   Rectal: Normal sphincter tone. No perineal, anal or rectal lesions. Prostate high riding, appeared enlarged at apex. No nodules  Back: No sacral Kern or dimples. No CVAT or SPT. Bladder is not palpable.     PATHOLOGY:      IMAGING (REVIEWED BY DR. GEORGETTE):      LABS:    UA:  03/2023: Present CaOx Crystals  01/2023: 1+ WBC, 6-10 WBC  03/2020: 250 LE      PSA:  01/2023: 0.60  07/2019: 0.55  07/2018: 0.63  04/2017: 0.69  12/2015: 0.74    Lab Results   Component Value Date    WBC 5.0 07/24/2023    HGB 12.9 (L) 07/24/2023    HCT 40.9 07/24/2023    MCV 88 07/24/2023    PLT 189 07/24/2023       Chemistry    Lab Results   Component Value Date/Time    NA 144 07/24/2023 1035    NA 140 06/04/2023 1448    K 4.1 07/24/2023 1035    K 4.1 06/04/2023 1448    CL 106 07/24/2023 1035    CL 110 06/04/2023 1448    CO2 23 07/24/2023 1035    CO2 32 06/04/2023 1448    BUN 13 07/24/2023 1035    BUN 10 06/04/2023 1448    CREATININE 0.95 07/24/2023 1035    CREATININE 0.90 06/04/2023 1448    Lab Results   Component Value Date/Time    CALCIUM 9.4 07/24/2023 1035    CALCIUM 9.3 06/04/2023 1448    ALKPHOS 58 06/04/2023 1448     AST 22 06/04/2023 1448    ALT <20 06/04/2023 1448    BILITOT 1.0 06/04/2023 1448           Recent Results (from the past 3 hours)   POCT urinalysis dipstick    Collection Time: 07/25/23  9:22 AM   Result Value Ref Range  Color, UA, POC Light Yellow Yellow, Light Yellow    Clarity, UA, POC Clear Clear    Spec Gravity, UA, POC 1.015 1.001 - 1.035    pH, UA, POC 6.0 5.0 - 8.0    Leukocytes, UA, POC Negative Negative    Nitrite, UA, POC Negative Negative    Protein, UA, POC Negative Negative    Glucose, UA, POC Negative Normal    Ketones, UA, POC Negative Negative    Urobilinogen, UA, POC Normal Normal    Bilirubin, UA, POC Negative Negative    Blood, UA, POC Negative Negative          Assessment :  Justin Navarro is a 83 y.o. male     - LUTS: We went through the male anatomy and possible physiology of lower urinary tract. His symptoms are multifactorial, enlarged prostate is likely the reason, however decreased bladder contractility/compliance, idiopathic overactive bladder, bladder inflammation, infection, voiding dysfunction and polyuria could contribute to his symptoms too.    I recommended him:    -Limit his fluid intake and avoid bladder irritants such as coffee, tea, spicy food.    -Timed voiding every 2-3 hours.    -Prevent constipation.    -Limit fluid intake 2 hours before sleep    -Void before go to sleep.    He would like to continue current medical management        Plan: - f/u prn    - Phimosis/BXO:     Pt feels no bothering. He would like observe.    - Left epididymal cyst:   Pt is asymptomatic and would like to observe.         [1]   Patient Active Problem List  Diagnosis    Testicular hypofunction    Hx of adenomatous colonic polyps    Chronic obstructive pulmonary disease, unspecified (Multi-HCC)    Erectile dysfunction    GERD (gastroesophageal reflux disease)    History of gout    History of malignant neoplasm of skin    Mixed hyperlipidemia     Hypothyroidism     History of oral cancer     Other abnormal glucose    Actinic keratosis    Vitiligo    Chronic anxiety    Mass of upper lobe of left lung    History of bilateral cataract extraction    History of radiation therapy    History of cholecystectomy    History of ankle surgery    History of appendectomy    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 No pertinent surgical history.  [3]   Current Outpatient Medications on File Prior to Visit   Medication Sig Dispense Refill    albuterol  90 mcg/actuation inhaler Inhale 2 puffs every 4 (four) hours if needed for wheezing.      atorvastatin  (Lipitor) 40 mg tablet Take 1 tablet (40 mg) by mouth once daily. 90 tablet 3  celecoxib  (CeleBREX ) 200 mg capsule Take 1 capsule (200 mg) by mouth twice daily. 180 capsule 3    fluticasone-umeclidin-vilanter (Trelegy Ellipta ) 200-62.5-25 mcg blister with device Inhale 1 Inhalation once daily. Rinse mouth after taking it. 60 each 2    levothyroxine  (Synthroid , Levoxyl ) 112 mcg tablet Take 1 tablet (112 mcg) by mouth once daily. 90 tablet 3    LORazepam  (Ativan ) 1 mg tablet TAKE 1 TABLET(1 MG) BY MOUTH IN THE MORNING AND AT BEDTIME AS NEEDED FOR ANXIETY 60 tablet 0    mirtazapine  (Remeron ) 15 mg tablet Take 1 tablet (15 mg) by mouth at bedtime. 90 tablet 3    oxybutynin  XL (Ditropan -XL) 5 mg 24 hr tablet Take 1 tablet (5 mg) by mouth once daily. Do not crush, chew, or split 90 tablet 3    pantoprazole  (ProtoNix ) 40 mg EC tablet Take 1 tablet (40 mg) by mouth before breakfast. Do not crush, chew, or split. 90 tablet 3    predniSONE   (Deltasone ) 10 mg tablet TAKE 5 TABLETS DAILY X 2 DAYS, 4 TABLETS DAILY X 2 DAYS, 3 TABLETS DAILY X 2 DAYS, 2 TABLETS DAILY X 2 DAYS THEN 1 TABLET DAILY X 2 DAYS 30 tablet 0    roflumilast  250 mcg tablet Take 250 mcg by mouth once daily. 14 tablet 2    tamsulosin (Flomax) 0.4 mg 24 hr capsule Take 0.4 mg by mouth once daily.      [DISCONTINUED] torsemide  (Demadex ) 5 mg tablet Take 2 tablets (10 mg) by mouth if needed each day (edema). 180 tablet 3     No current facility-administered medications on file prior to visit.   [4]   Allergies  Allergen Reactions    Belsomra [Suvorexant] Other    Iodinated Contrast Media Hives    Penicillin G Hives   [5] No family history on file.

## 2023-07-25 NOTE — Progress Notes (Signed)
 Department of Cardiovascular Medicine  [address]  [address]  [place] [address]  Scheduling: [phone]  Dept: [phone]  Dept Fax: [phone]     Cardiology Consultation     Date of Service: 07/25/2023    Patient: Justin Navarro  DOB: 09-29-1940   MRN#: [id number]  CSN: [id number]  Primary Care Physician: Norleen Candle, MD    History of Present Illness: This is a 83 y.o. 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    In September 2024 had a fall and hit his head on the toilet.  He did not remember much surrounding the fall but knows that he was also sick with COVID at the time.  Was diagnosed with a intracranial bleed while in [place] .  No surgery was required per report.  Was discharged to rehab afterwards.    In March 2025 noted to have coronary artery calcifications on imaging.  As part of his workup underwent a stress test and echocardiogram that were normal.    At our last visit his pravastatin  was switched to atorvastatin .  He has been doing well with this switch.  His breathing is stable and he denies any significant chest discomfort.    He is looking into transitioning to assisted living while here in [place] .      History:  Patient Active Problem List   Diagnosis    Testicular hypofunction    Hx of adenomatous colonic polyps    Chronic obstructive pulmonary disease, unspecified (Multi-HCC)    Erectile dysfunction    GERD (gastroesophageal reflux disease)    History of gout    History of malignant neoplasm of skin    Mixed hyperlipidemia     Hypothyroidism     History of oral cancer    Other abnormal glucose    Actinic keratosis    Vitiligo    Chronic anxiety    Mass of upper lobe of left lung     History of bilateral cataract extraction    History of radiation therapy    History of cholecystectomy    History of ankle surgery    History of appendectomy    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 calories with serious comorbidity and body mass index (BMI) of 31.0 to 31.9 in adult    PAC (premature atrial contraction)    HI (head injury)    Steroid dependence (Multi-HCC)    Vitamin D  deficiency    Prediabetes    Marijuana use    Urge incontinence of urine    Venous insufficiency    Cellulitis of right lower leg    History of lung cancer    Lower urinary tract symptoms (LUTS)         Allergies:  Belsomra [suvorexant], Iodinated contrast media, and Penicillin g    Current Medications:   Current Outpatient Medications   Medication Instructions    albuterol  90 mcg/actuation inhaler 2 puffs, Every 4 hours PRN    atorvastatin  (LIPITOR) 40 mg, oral, Daily    celecoxib  (CELEBREX ) 200 mg, oral, 2 times daily    fluticasone-umeclidin-vilanter (Trelegy Ellipta ) 200-62.5-25 mcg blister with  device 1 Inhalation, inhalation, Daily, Rinse mouth after taking it.    levothyroxine  (SYNTHROID , LEVOXYL ) 112 mcg, oral, Daily    LORazepam  (ATIVAN ) 1 mg, oral, 2 times daily PRN    mirtazapine  (REMERON ) 15 mg, oral, Nightly    oxybutynin  XL (DITROPAN -XL) 5 mg, oral, Daily, Do not crush, chew, or split    pantoprazole  (PROTONIX ) 40 mg, oral, Daily before breakfast, Do not crush, chew, or split.    predniSONE  (Deltasone ) 10 mg tablet TAKE 5 TABLETS DAILY X 2 DAYS, 4 TABLETS DAILY X 2 DAYS, 3 TABLETS DAILY X 2 DAYS, 2 TABLETS DAILY X 2 DAYS THEN 1 TABLET DAILY X 2 DAYS    roflumilast  250 mcg, oral, Daily    tamsulosin (FLOMAX) 0.4 mg, Daily    torsemide  (DEMADEX ) 10-15 mg, oral, Daily PRN        Review of Systems:   HEENT: no glaucoma; no epistaxis; no dysphagia.  Pulmonary: no asthma or emphysema; no pneumonia or tuberculosis; no hemoptysis.  Cardiac: ss History of Present Illness.  GI: no peptic  ulcer disease; no hepatitis; no cholelithiasis; no melena or bright red blood per rectum.  GU: no kidney stones or infection; no hematuria; no difficulty urinating.  Endocrine:  no thyroid disease; no diabetes.  Neurologic: no stroke or transient ischemic attack; no gait disturbance.  Musculoskeletal: no gout; no osteoarthritis.    Physical Exam:   Recent vitals (last 24 hours):  Visit Vitals  BP 128/78   Pulse 98   Ht 1.803 m (5' 11)   Wt 106.6 kg (235 lb)   SpO2 98%   BMI 32.78 kg/m   BSA 2.31 m          General: no acute distress.  HEENT: PERRLA; normocephalic; atraumatic  Neck: supple, JVP at base of neck  Lungs: no wheezes, rales, or rhonchi  Heart: regular in rate and rhythm; no murmur, rub or gallop  Abdomen: soft, non-tender; no hepatosplenomegaly; no bruits  Extremities: no clubbing, cyanosis, or edema, intact distal pulses   Neurologic: alert and oriented x 3.    Labs, Imaging & Other Studies:   Diagnostic Cardiology:   Pharm SPECT 04/30/23  Pharmacologic SPECT with normal left ventricular perfusion.  Normal systolic function with estimate LVEF 68% with normal wall motion.     TTE 06/22/23  Normal LV cavity size and wall thickness.  Normal systolic function with estimated LVEF 60%.  Normal wall motion.  Normal RV size and function.   No significant valvular abnormalities.   Estimated RVSP 41 mmHg assuming a RA pressure of 10 mmHg.   Mildly dilated aortic root measuring 4.2 cm.     CT chest 04/02/2023  CARDIOVASCULAR: Severe coronary artery calcifications. No significant pericardial effusion. Severe atherosclerotic calcifications of the aortic arch, proximal aortic arch branches and descending thoracic aorta.       Assessment/Plan:  # Coronary artery calcifications  # Hyperlipidemia  He has severe coronary calcifications that were seen on CT scan.  Recommend aggressive lowering of his cholesterol.  Goal LDL<70.    He has no significant obstructive CAD as evidenced by a stress test in March 2025. Will order  repeat lipid panel with his next set of labs.    His breathing is stable.  He appears euvolemic on exam.    Given his history of a traumatic head bleed, possibly a subdural, would avoid aspirin at this time.    - Atorvastatin   - Repeat lipid panel with next of labs  RTC in 1 year    Gildardo Kearns, MD  07/25/2023

## 2023-07-25 NOTE — Progress Notes (Signed)
 [facility]  [address]  [place] [address]  Dept: [phone]  Dept Fax: [phone]     Patient ID: Justin Navarro is a 83 y.o. male who presents for No chief complaint on file..    Subjective   HPI  Patient Active Problem List   Diagnosis    Testicular hypofunction    Hx of adenomatous colonic polyps    Chronic obstructive pulmonary disease, unspecified (Multi-HCC)    Erectile dysfunction    GERD (gastroesophageal reflux disease)    History of gout    History of malignant neoplasm of skin    Mixed hyperlipidemia     Hypothyroidism     History of oral cancer    Other abnormal glucose    Actinic keratosis    Vitiligo    Chronic anxiety    Mass of upper lobe of left lung    History of bilateral cataract extraction    History of radiation therapy    History of cholecystectomy    History of ankle surgery    History of appendectomy    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  No past surgical history on file.  No family history on file.  Social History     Socioeconomic History    Marital status: Divorced     Spouse name: Not on file    Number of children: Not on file    Years of education: Not on file    Highest education level: Not on file   Occupational History    Not on file   Tobacco Use    Smoking status: Former     Types: Cigarettes    Smokeless tobacco: Never   Substance  and Sexual Activity    Alcohol use: Not Currently    Drug use: Yes     Types: Marijuana    Sexual activity: Not on file   Other Topics Concern    Not on file   Social History Narrative    Not on file     Social Determinants of Health     Financial Resource Strain: Not on file   Food Insecurity: Not on file   Transportation Needs: Not on file   Physical Activity: Not on file   Stress: Not on file   Social Connections: Not on file   Intimate Partner Violence: Not on file   Housing Stability: Not on file          Objective   There were no vitals taken for this visit.  Physical Exam    Assessment/Plan   There are no diagnoses linked to this encounter.

## 2023-07-25 NOTE — Progress Notes (Signed)
 PVR: patient's bladder was scanned with a Verathon U/S scanner at least 3 times with volume 156 mL.

## 2023-07-25 NOTE — Progress Notes (Signed)
PVR: patient's bladder was scanned with a Verathon U/S scanner at least 3 times with volume 55 mL.

## 2023-07-26 NOTE — Telephone Encounter (Addendum)
 Spoke with patient.  Patient verbalizes understanding and is in agreement.         ----- Message from Dr. Norleen Candle, MD sent at 07/25/2023  7:49 AM EDT -----  Regarding: torsemide   Due to good BMP and BNP   Torsemide  was reordered as 5 mgm   Take 10-15 mg each day   One year supply sent      Norleen Candle, MD  Lamarr Craze, RN  Lab Results       Component                Value               Date                       HGBA1C                   6.0 (H)             07/24/2023                Average glucose is 126    Normal is 100-110  A1C is good.

## 2023-10-03 NOTE — Telephone Encounter (Signed)
 Last OV with PCP: 06/08/2023    Next OV with PCP: 10/10/2023    Last HGBA1C:   HgbA1C (%)   Date Value   07/24/2023 6.0 (H)       Last BP:   BP Readings from Last 3 Encounters:   07/25/23 128/78   07/25/23 124/73   06/13/23 119/72       TSH:   TSH   Date Value Ref Range Status   02/06/2023 1.160 0.450 - 4.500 uIU/mL Final     Comment:     No apparent thyroid disorder. Additional testing not indicated. In  rare instances, Secondary Hypothyroidism as well as Subclinical  Hypothyroidism have been reported in some patients with normal TSH  values.         Last TOX screen (if applicable):02/06/23    Labs    Lab Results   Component Value Date    CALCIUM 9.4 07/24/2023    ALBUMIN 3.5 06/04/2023    NA 144 07/24/2023    K 4.1 07/24/2023    CO2 23 07/24/2023    CL 106 07/24/2023    BUN 13 07/24/2023    CREATININE 0.95 07/24/2023      AST   Date Value Ref Range Status   06/04/2023 22 22 - 42 U/L Final   04/02/2023 35 6 - 42 U/L Final     ALT   Date Value Ref Range Status   06/04/2023 <20 0 - 55 U/L Final   04/02/2023 24 0 - 55 U/L Final     No components found for: TBILI  Hemoglobin   Date Value Ref Range Status   06/04/2023 13.1 13.0 - 17.5 g/dL Final     Hgb   Date Value Ref Range Status   07/24/2023 12.9 (L) 13.0 - 17.7 g/dL Final     Hematocrit   Date Value Ref Range Status   06/04/2023 40.4 37.0 - 53.0 % Final     Hct   Date Value Ref Range Status   07/24/2023 40.9 37.5 - 51.0 % Final     Platelets   Date Value Ref Range Status   07/24/2023 189 150 - 450 x10E3/uL Final   06/04/2023 180 150 - 400 K/uL Final     No results found for: PTT  No results found for: INR  No results found for: PROTIME  No results found for: PTADJUSTED

## 2023-10-10 ENCOUNTER — Ambulatory Visit: Admit: 2023-10-10 | Discharge: 2023-10-10 | Payer: MEDICARE | Attending: Internal Medicine | Primary: Internal Medicine

## 2023-10-10 ENCOUNTER — Other Ambulatory Visit: Admit: 2023-10-10 | Discharge: 2023-10-10 | Payer: MEDICARE | Primary: Internal Medicine

## 2023-10-10 LAB — CBC WITH DIFFERENTIAL
Basophils %: 0.2 %
Basophils Absolute: 0.01 K/uL (ref 0.00–0.22)
Eosinophils %: 5.3 %
Eosinophils Absolute: 0.27 K/uL (ref 0.00–0.50)
Hematocrit: 36.9 % — ABNORMAL LOW (ref 37.0–53.0)
Hemoglobin: 12 g/dL — ABNORMAL LOW (ref 13.0–17.5)
Immature Granulocytes %: 0.2 %
Immature Granulocytes Absolute: 0.01 K/uL (ref 0.00–0.10)
Lymphocyte %: 29.4 %
Lymphocytes Absolute: 1.5 K/uL (ref 0.70–4.00)
MCH: 27.7 pg (ref 26.0–34.0)
MCHC: 32.5 g/dL (ref 31.0–37.0)
MCV: 85.2 fL (ref 80.0–100.0)
MPV: 10.4 fL (ref 9.1–12.4)
Monocytes %: 12.5 %
Monocytes Absolute: 0.64 K/uL (ref 0.38–0.83)
NRBC %: 0 % (ref 0.0–0.0)
NRBC Absolute: 0 K/uL (ref 0.00–2.00)
Neutrophil %: 52.4 %
Neutrophils Absolute: 2.67 K/uL (ref 1.50–7.95)
Platelets: 174 K/uL (ref 150–400)
RBC: 4.33 M/uL (ref 4.20–5.90)
RDW-CV: 13.9 % (ref 11.5–14.5)
RDW-SD: 43.4 fL (ref 35.0–51.0)
WBC: 5.1 K/uL (ref 4.0–11.0)

## 2023-10-10 LAB — COMPREHENSIVE METABOLIC PANEL
ALT: <20 U/L (ref 0–55)
AST: 24 U/L (ref 22–42)
Albumin: 3.3 g/dL (ref 3.2–5.0)
Alkaline phosphatase: 86 U/L (ref 30–130)
Anion Gap: <1 mmol/L — ABNORMAL LOW (ref 3–14)
BUN: 13 mg/dL (ref 6–24)
Bilirubin, total: 0.7 mg/dL (ref 0.5–1.2)
CO2 (Bicarbonate): 31 mmol/L (ref 20–32)
Calcium: 9.4 mg/dL (ref 8.5–10.5)
Chloride: 105 mmol/L (ref 98–110)
Creatinine: 1 mg/dL (ref 0.55–1.30)
Glucose: 88 mg/dL (ref 70–139)
Potassium: 3.8 mmol/L (ref 3.6–5.2)
Protein, total: 6.8 g/dL (ref 6.0–8.4)
Sodium: 136 mmol/L (ref 135–146)
eGFRcr: 75 mL/min/1.73m*2 (ref >=60)

## 2023-10-10 LAB — LIPID PANEL
Cholesterol/HDL Ratio: 3
Cholesterol: 115 mg/dL (ref <=200)
HDL cholesterol: 38 mg/dL — ABNORMAL LOW (ref >=40)
LDL cholesterol, calculated: 47 mg/dL (ref 0–130)
Non-HDL Calculation: 77 mg/dL (ref <160)
Triglycerides: 149 mg/dL (ref <=150)

## 2023-10-10 LAB — IRON, TRANSFERRIN, TIBC
Iron Saturation: 14 % (ref 10–50)
Iron: 47 ug/dL (ref 30–180)
TIBC: 329 ug/dL (ref 253–463)
Transferrin: 235 mg/dL (ref 181–331)

## 2023-10-10 LAB — HEMOGLOBIN A1C: HEMOGLOBIN A1C % (INT/EXT): 5.7 % — ABNORMAL HIGH (ref <5.6)

## 2023-10-10 LAB — FERRITIN: Ferritin: 43.2 ng/mL (ref 22.0–336.0)

## 2023-10-10 NOTE — Assessment & Plan Note (Signed)
 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

## 2023-10-10 NOTE — Assessment & Plan Note (Signed)
 Has centrilobular emphysema   And should stay current on vaccines

## 2023-10-10 NOTE — Telephone Encounter (Signed)
 LMTCB GLENWOOD Heinrich, LPN    MD faxed over his OV notes from today with all of his vital signs, med and problem list.    I refaxed the med list and problem list in a different format.    The vital signs are in the OV notes, what is the issue?

## 2023-10-10 NOTE — Assessment & Plan Note (Signed)
 Lab Results   Component Value Date    CHOL 145 02/06/2023     Lab Results   Component Value Date    HDL 53 02/06/2023     Lab Results   Component Value Date    LDLCALC 80 02/06/2023     Lab Results   Component Value Date    TRIG 59 02/06/2023       Well controlled lipids on statin

## 2023-10-10 NOTE — Assessment & Plan Note (Signed)
 Lab Results   Component Value Date    HGBA1C 6.0 (H) 07/24/2023           stable

## 2023-10-10 NOTE — Assessment & Plan Note (Signed)
 Lab Results   Component Value Date    TSH 1.160 02/06/2023         Stable will be due again in mid winter

## 2023-10-10 NOTE — Telephone Encounter (Signed)
 Per Jhonnie, she says they will not accept our printed out med list, med list lists allergies.    They will not accept our printed out problem list from chart either.    Per Jhonnie, she wants us  to hand write all this information on their form.  I told her this is very tedious and unnecessary - all of the other ALFs accept our faxed med list, problem list, last OV notes- this has all the information she is asking.

## 2023-10-10 NOTE — Telephone Encounter (Signed)
 Contacted [facility] lab to add on Iron,ferritin,TIBS test code [phone]  Spoke to sarah she will add that test on Dr.Mudrock placing the order

## 2023-10-10 NOTE — Progress Notes (Signed)
 [facility] [place]  [address]  [place] [address]  Dept: [phone]  Dept Fax: [phone]     Patient ID: Justin Navarro is a 83 y.o. male who presents for Follow-up.    Subjective   HPI    He is moving to Assisted Living    [facility]     In [place]    This month     He wants no vaccines today      Uses 4 point cane      No falls in last 6 months       Patient still drives     He is s/p bilateral cataract surgery     Has mild anemia       Lab Results   Component Value Date    HCT 36.9 (L) 10/10/2023         He refuses to have iron level done       Hx of :  gnosis:  1. 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 History of radiation therapy    3. History of cigarette smoking    4. Pulmonary nodules    5. Centrilobular emphysema (Multi-HCC)             History of radiation treatment     In 2024--was given in [place]  [facility])            [place] CT pending next week   PET Scan may be offered after the results return    Patient declines chemotherapy    Wt--233 pounds today     Wt--240.5 pounds in May     Lost 7.5 pounds since May due to poor meal preps            Patient Active Problem List   Diagnosis    Testicular hypofunction    Hx of adenomatous colonic polyps    Chronic obstructive pulmonary disease, unspecified (Multi-HCC)    Erectile dysfunction    GERD (gastroesophageal reflux disease)    History of gout    History of malignant neoplasm of skin    Mixed hyperlipidemia     Hypothyroidism     History of oral cancer    Other abnormal glucose    Actinic keratosis    Vitiligo    Chronic anxiety    Squamous cell lung cancer, left (Multi-HCC)    History of bilateral cataract extraction    History of radiation therapy    History of cholecystectomy    History of ankle surgery    History of appendectomy    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  No past surgical history on file.  No family history on file.  Social History     Socioeconomic History    Marital status: Divorced     Spouse name: Not on file    Number of children: Not on file    Years of education: Not on file    Highest education level: Not on file   Occupational History    Not on file   Tobacco Use    Smoking status: Former     Types: Cigarettes    Smokeless tobacco: Never   Substance and Sexual Activity    Alcohol use: Not Currently    Drug use: Yes     Types: Marijuana    Sexual activity: Not on file   Other Topics Concern    Not on file   Social History Narrative    Not on file     Social Determinants of Health     Financial Resource Strain: Not on file   Food Insecurity: Not on file   Transportation Needs: Not on file   Physical Activity: Not on file   Stress: Not on file   Social Connections: Not on file   Intimate Partner Violence: Not on file   Housing Stability: Not on file          Objective   Visit Vitals  BP 120/68 (BP Location: Left arm, Patient Position: Sitting, BP Cuff Size: Large adult)   Pulse 79   Temp 36.1 C (96.9 F) (Temporal)   Ht 1.803 m   Wt 105.7 kg   SpO2 97%   BMI 32.50 kg/m   BSA 2.3 m     Physical Exam  Vitals reviewed.   Constitutional:       Appearance: Normal appearance. He is obese.   HENT:      Head: Normocephalic and atraumatic.      Right Ear: Tympanic membrane, ear canal and external ear normal.      Left Ear: Tympanic membrane, ear canal and external ear  normal.      Ears:      Comments: Bilateral hearing loss   With hearing aids     Nose: Nose normal.      Mouth/Throat:      Mouth: Mucous membranes are moist.      Pharynx: Oropharynx is clear.   Eyes:      Extraocular Movements: Extraocular movements intact.      Conjunctiva/sclera: Conjunctivae normal.      Pupils: Pupils are equal, round, and reactive to light.      Comments: History of bilateral   Cataract extractions   Cardiovascular:      Rate and Rhythm: Normal rate and regular rhythm.      Pulses: Normal pulses.      Heart sounds: Normal heart sounds.   Pulmonary:      Effort: Pulmonary effort is normal.      Breath sounds: Normal breath sounds.   Abdominal:      General: Abdomen is flat. Bowel sounds are normal.   Musculoskeletal:         General: Tenderness present.      Cervical back: Normal range of motion and neck supple.      Comments: Chronic lumbar spondylosis    With imbalance        Skin:     General: Skin is warm and dry.   Neurological:      General: No focal deficit present.      Mental Status: He is alert and oriented  to person, place, and time. Mental status is at baseline.   Psychiatric:         Mood and Affect: Mood normal.         Behavior: Behavior normal.         Thought Content: Thought content normal.         Judgment: Judgment normal.         Assessment/Plan   Zair was seen today for follow-up.  Chronic prescription benzodiazepine use  -     ToxAssure Flex 15, Urine  Anemia, unspecified type  -     Iron, Ferritin, TIBC; Future  Squamous cell lung cancer, left (Multi-HCC)  Assessment & Plan:   Sees Dr [facility]  oncology for this tumor   CT coming next week and likely PET Scan afterwards   When the CT is reviewed    Patient had radiation therapy in [place]         He has refused chemotherapy  Acquired hypothyroidism   Assessment & Plan:  Lab Results   Component Value Date    TSH 1.160 02/06/2023         Stable will be due again in mid winter  Prediabetes  Assessment & Plan:        Lab  Results   Component Value Date    HGBA1C 6.0 (H) 07/24/2023           stable  Mixed hyperlipidemia   Assessment & Plan:       Lab Results   Component Value Date    CHOL 145 02/06/2023     Lab Results   Component Value Date    HDL 53 02/06/2023     Lab Results   Component Value Date    LDLCALC 80 02/06/2023     Lab Results   Component Value Date    TRIG 59 02/06/2023       Well controlled lipids on statin     Centrilobular emphysema (Multi-HCC)  Assessment & Plan:    Has centrilobular emphysema   And should stay current on vaccines

## 2023-10-11 ENCOUNTER — Inpatient Hospital Stay: Admit: 2023-10-11 | Payer: MEDICARE | Primary: Internal Medicine

## 2023-10-11 DIAGNOSIS — C3492 Malignant neoplasm of unspecified part of left bronchus or lung: Principal | ICD-10-CM

## 2023-10-14 LAB — TOXASSURE FLEX 15, URINE
6-Acetylmorphine IA, Ur: NEGATIVE ng/mL
7-Aminoclonazepam, Ur: NOT DETECTED ng/mg{creat}
Alpha-Hydroxyalprazolam, Ur: NOT DETECTED ng/mg{creat}
Alpha-Hydroxymidazolam, Ur: NOT DETECTED ng/mg{creat}
Alpha-Hydroxytriazolam, Ur: NOT DETECTED ng/mg{creat}
Alprazolam, Ur: NOT DETECTED ng/mg{creat}
Amphetamines IA, UR: NEGATIVE ng/mL
Barbiturates IA, Ur: NEGATIVE ng/mL
Benzodiazepines, Ur: POSITIVE
Buprenorphine, Ur: NEGATIVE
Buprenorphine, Ur: NOT DETECTED ng/mg{creat}
Clonazepam, Ur: NOT DETECTED ng/mg{creat}
Cocaine Metabolite IA, Ur: NEGATIVE ng/mL
Creatinine, Urine: 72 mg/dL (ref >=20)
Desalkylflurazepam, Ur: NOT DETECTED ng/mg{creat}
Desmethyldiazepam, Ur: NOT DETECTED ng/mg{creat}
Desmethylflunitrazepam, Ur: NOT DETECTED ng/mg{creat}
Diazepam, Ur: NOT DETECTED ng/mg{creat}
Ethyl Alcohol Enzymatic, Ur: NEGATIVE g/dL
Fentanyl, Ur: NEGATIVE
Fentanyl, Ur: NOT DETECTED ng/mg{creat}
Flunitrazepam, Ur: NOT DETECTED ng/mg{creat}
Lorazepam, Ur: 556 ng/mg{creat}
Methadone IA, Ur: NEGATIVE ng/mL
Methadone Mtb IA, Ur: NEGATIVE ng/mL
Midazolam, Ur: NOT DETECTED ng/mg{creat}
Norbuprenorphine, Ur: NOT DETECTED ng/mg{creat}
Norfentanyl, Ur: NOT DETECTED ng/mg{creat}
Opiate Class IA, Ur: NEGATIVE ng/mL
Oxazepam, Ur: NOT DETECTED ng/mg{creat}
Oxycodone Class IA, Ur: NEGATIVE ng/mL
Phencyclidine IA, Ur: NEGATIVE ng/mL
Tapentadol, IA, Ur: NEGATIVE ng/mL
Temazepam, Ur: NOT DETECTED ng/mg{creat}
Tramadol IA, Ur: NEGATIVE ng/mL

## 2023-10-14 LAB — CANNABINOIDS, MS, UR RFX (REFLEX ONLY)
Cannabinoids Confirm, Ur: POSITIVE
Carboxy-THC, Ur: 315 ng/mg{creat}

## 2023-10-15 ENCOUNTER — Inpatient Hospital Stay: Admit: 2023-10-15 | Payer: MEDICARE | Primary: Internal Medicine

## 2023-10-15 ENCOUNTER — Ambulatory Visit
Admit: 2023-10-15 | Discharge: 2023-10-15 | Payer: MEDICARE | Attending: Hematology & Oncology | Primary: Internal Medicine

## 2023-10-15 ENCOUNTER — Other Ambulatory Visit: Admit: 2023-10-15 | Discharge: 2023-10-15 | Payer: MEDICARE | Primary: Internal Medicine

## 2023-10-15 DIAGNOSIS — C3492 Malignant neoplasm of unspecified part of left bronchus or lung: Principal | ICD-10-CM

## 2023-10-15 DIAGNOSIS — N63 Unspecified lump in unspecified breast: Secondary | ICD-10-CM

## 2023-10-15 LAB — CBC WITH DIFFERENTIAL
Basophils %: 0.4 %
Basophils Absolute: 0.02 K/uL (ref 0.00–0.22)
Eosinophils %: 6.1 %
Eosinophils Absolute: 0.3 K/uL (ref 0.00–0.50)
Hematocrit: 39.8 % (ref 37.0–53.0)
Hemoglobin: 13 g/dL (ref 13.0–17.5)
Immature Granulocytes %: 0.4 %
Immature Granulocytes Absolute: 0.02 K/uL (ref 0.00–0.10)
Lymphocyte %: 33 %
Lymphocytes Absolute: 1.63 K/uL (ref 0.70–4.00)
MCH: 27.8 pg (ref 26.0–34.0)
MCHC: 32.7 g/dL (ref 31.0–37.0)
MCV: 85 fL (ref 80.0–100.0)
MPV: 10 fL (ref 9.1–12.4)
Monocytes %: 10.3 %
Monocytes Absolute: 0.51 K/uL (ref 0.38–0.83)
NRBC %: 0 % (ref 0.0–0.0)
NRBC Absolute: 0 K/uL (ref 0.00–2.00)
Neutrophil %: 49.8 %
Neutrophils Absolute: 2.46 K/uL (ref 1.50–7.95)
Platelets: 193 K/uL (ref 150–400)
RBC: 4.68 M/uL (ref 4.20–5.90)
RDW-CV: 14.4 % (ref 11.5–14.5)
RDW-SD: 44 fL (ref 35.0–51.0)
WBC: 4.9 K/uL (ref 4.0–11.0)

## 2023-10-15 LAB — COMPREHENSIVE METABOLIC PANEL
ALT: <20 U/L (ref 0–55)
AST: 22 U/L (ref 22–42)
Albumin: 3.5 g/dL (ref 3.2–5.0)
Alkaline phosphatase: 79 U/L (ref 30–130)
Anion Gap: <1 mmol/L — ABNORMAL LOW (ref 3–14)
BUN: 12 mg/dL (ref 6–24)
Bilirubin, total: 1.1 mg/dL (ref 0.5–1.2)
CO2 (Bicarbonate): 32 mmol/L (ref 20–32)
Calcium: 9.3 mg/dL (ref 8.5–10.5)
Chloride: 105 mmol/L (ref 98–110)
Creatinine: 1.1 mg/dL (ref 0.55–1.30)
Glucose: 115 mg/dL (ref 70–139)
Potassium: 3.9 mmol/L (ref 3.6–5.2)
Protein, total: 6.5 g/dL (ref 6.0–8.4)
Sodium: 135 mmol/L (ref 135–146)
eGFRcr: 67 mL/min/1.73m*2 (ref >=60)

## 2023-10-15 LAB — RETICULOCYTES
Immature Reticulocyte Fraction: 12 % (ref 4.0–22.0)
Retic Absolute Count: 0.1 M/uL (ref 0.05–0.10)
Reticulocyte Count %: 2.1 % (ref 0.5–2.2)

## 2023-10-15 LAB — TSH WITH REFLEX: TSH: 0.63 u[IU]/mL (ref 0.27–4.94)

## 2023-10-15 LAB — VITAMIN B12: Vitamin B-12: 227 pg/mL (ref 193–986)

## 2023-10-15 NOTE — Telephone Encounter (Signed)
 Ct Chest 04/14/24 9am [facility]    Labs 04/14/24 1:30pm  04/15/23 1:50 pm L breast Mammogram/US     Follow up Dr. Pennacchio 04/16/24 @ 9:30 am    Mailed copy of appointment to patient.

## 2023-10-15 NOTE — Telephone Encounter (Signed)
 Spoke with patient.  Patient verbalizes understanding and is in agreement.       He is seeing Dr. Lovella today- told patient to review these results with Dr. Lovella at appt today.

## 2023-10-15 NOTE — Progress Notes (Signed)
 Cancer Center Follow Up Visit Note -  Adult      MRN: [id number] Patient Name: Justin Navarro     DOB:  August 08, 1940 Gender: male    AGE:   83 y.o. Encounter Date: 10/15/2023     Diagnosis:  1. 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 History of radiation therapy    3. History of cigarette smoking    4. Anemia due to other cause, not classified    5. Subareolar mass of left breast           Cancer Staging   No matching staging information was found for the patient.      Primary Language:   English    Interval History:  [name] is a 83 y.o. male who comes to the clinic for follow up. The patient has previously been treated in [place] . In October he decided to leave [place]  and moved back to [place].  He was last seen on 06/13/2023. A CT scan in May 2025 showed stable appearance of the left upper lobe which is the site of previously treated lung cancer.  There were multiple right-sided pulmonary nodules the largest measuring 1.11.6 centimeters at the right lung base.    Patient had a recent chest CT scan on 10/11/2023.    He has recently noted some tenderness in a lump in his left breast behind the nipple.  There is no history of trauma to the area.  Summary of Past Treatment:      There is a history of pulmonary nodules for which the patient was previously evaluated at [facility] with a bronchoscopy/EBUS in December 2021.  At that time the patient had a biopsy of a  left upper lobe lung nodule which was negative for malignancy.  In the fall of 2023 he had further evaluation while in [place]  and  underwent a PET scan on 12/02/2021 which showed a centrally located left upper lobe mass at the level of the superior left hilum.  This mass was enlarged compared to a CT scan of the chest done  on 12/08/2020.  There was no evidence of lymphadenopathy or distant metastases.   On 01/02/2022 the patient had a bronchoscopy and biopsy of the left upper lobe lung mass with pathology negative for  malignancy.  A follow-up CT scan in March 2024 again showed a left upper lobe mass like opacity measuring 4 x 2.1 cm.  A repeat CT scan on August 15, 2022 showed a 3.3 x 2 cm spiculated nodule in the left upper lobe which was slightly enlarged from the previous CT.  In addition there was a 4 mm and a 6 mm nodule in the right lower lobe.  The patient underwent bronchoscopy with biopsy of the left upper lobe lung mass on 08/21/2022 and pathology was positive for squamous cell carcinoma.  The patient underwent a PET scan on 09/19/2022 which showed abnormal uptake in the left upper lobe mass near the hilum.  Subcentimeter nodules in the right lower lobe did not show FDG uptake. There was no evidence of additional FDG uptake to suggest metastatic disease.  The patient declined chemotherapy and was treated with stereotactic radiotherapy to the left upper lobe mass while in [place] .  The patient has significant underlying COPD and sees Dr. Honor.  He has been diagnosed with centrilobular emphysema. The patient has required prednisone  for COPD exacerbations ,most recently in March 2025.   The patient states that over the  past few months he has noted worsening edema of the lower extremities and has been started on torsemide  5mg  daily.  The patient has a chronic cough with chest congestion. He has had several courses of antibiotics in th epast.  Allergies   Allergen Reactions    Belsomra [Suvorexant] Other    Iodinated Contrast Media Hives    Penicillin G Hives       Current Outpatient Medications   Medication Instructions    albuterol  90 mcg/actuation inhaler 2 puffs, Every 4 hours PRN    atorvastatin  (LIPITOR) 40 mg, oral, Daily    celecoxib  (CELEBREX ) 200 mg, oral, 2 times daily    fluticasone-umeclidin-vilanter (Trelegy Ellipta ) 200-62.5-25 mcg blister with device 1 Inhalation, inhalation, Daily, Rinse mouth after taking it.    levothyroxine  (SYNTHROID , LEVOXYL ) 112 mcg, oral, Daily    LORazepam  (ATIVAN ) 1 mg, oral, 2 times  daily PRN    mirtazapine  (REMERON ) 15 mg, oral, Nightly    oxybutynin  XL (DITROPAN -XL) 5 mg, oral, Daily, Do not crush, chew, or split    pantoprazole  (PROTONIX ) 40 mg, oral, Daily before breakfast, Do not crush, chew, or split.    tamsulosin (FLOMAX) 0.4 mg, Daily    torsemide  (DEMADEX ) 10-15 mg, oral, Daily PRN       No past medical history on file.    Patient Active Problem List   Diagnosis    Testicular hypofunction    Hx of adenomatous colonic polyps    Chronic obstructive pulmonary disease, unspecified (Multi-HCC)    Erectile dysfunction    GERD (gastroesophageal reflux disease)    History of gout    History of malignant neoplasm of skin    Mixed hyperlipidemia     Hypothyroidism     History of oral cancer    Other abnormal glucose    Actinic keratosis    Vitiligo    Chronic anxiety    Squamous cell lung cancer, left (Multi-HCC)    History of bilateral cataract extraction    History of radiation therapy    History of cholecystectomy    History of ankle surgery    History of appendectomy    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    No family history on file.    Social History     Tobacco Use    Smoking status: Former     Types: Cigarettes    Smokeless tobacco: Never   Substance Use Topics    Alcohol use: Not Currently    Drug use: Yes     Types: Marijuana       Review of Systems   All other systems reviewed and are negative.       There were no vitals filed for this visit.           04/23/2023     9:50 AM 04/30/2023    11:09  AM 06/08/2023     11:43 AM 06/13/2023     2:30 PM 07/25/2023     8:50 AM 07/25/2023    10:05 AM 10/10/2023    11:11 AM   Vitals   Systolic 138 140 92 119 124 128 120   Diastolic 68 80 58 72 73 78 68   Pulse 68 85 77 86 89 98 79   Temp   36.5 C (97.7 F) 36.2 C (97.2 F)   36.1 C (96.9 F)   Resp    22      Height (in) 1.791 m 1.829 m   1.829 m 1.803 m 1.803 m   Weight (lb) 230 240 241 235.7 235.67 235 233   SpO2   95 % 96 % 95 % 98 % 97 %   BMI 32.54 kg/m2 32.55 kg/m2 32.69 kg/m2 31.97 kg/m2 31.96 kg/m2 32.78 kg/m2 32.5 kg/m2   BSA (m2) 2.28 m2 2.35 m2 2.36 m2 2.33 m2 2.33 m2 2.31 m2 2.3 m2   Visit Report Report  Report Report Report    Report Report    Report Report         Last Documented Distress Screening Result:       Performance Status:  0 - Asymptomatic    Physical Exam  Constitutional:       Appearance: Normal appearance. He is obese.   HENT:      Head: Normocephalic.      Nose: Nose normal.   Eyes:      Extraocular Movements: Extraocular movements intact.      Conjunctiva/sclera: Conjunctivae normal.   Cardiovascular:      Rate and Rhythm: Normal rate and regular rhythm.      Heart sounds: Normal heart sounds.   Pulmonary:      Effort: Pulmonary effort is normal.      Breath sounds: Normal breath sounds.   Chest:          Comments: There is an area of tenderness with some soft tissue thickening/nodularity  in the retroareolar area of the left breast.  There is no associated skin change or skin retraction,  Abdominal:      General: Abdomen is flat.      Palpations: Abdomen is soft.      Comments: No hepatomegaly, no splenomegaly   Musculoskeletal:         General: Normal range of motion.      Right lower leg: No edema.      Left lower leg: No edema.   Lymphadenopathy:      Cervical: No cervical adenopathy.      Upper Body:      Right upper body: No supraclavicular or axillary adenopathy.      Left upper body: No supraclavicular or axillary adenopathy.   Skin:     Findings: No lesion or rash.   Neurological:      General: No  focal deficit present.      Mental Status: He is oriented to person, place, and time.   Psychiatric:         Mood and Affect: Mood normal.         Lab Tests Reviewed:      No results found for this or any previous visit (from the past 24 hours).  Recent Results (from the past 4 weeks)   Lipid panel    Collection Time: 10/10/23 10:51 AM   Result Value Ref Range    Triglycerides 149 <=150 mg/dL  Cholesterol 115 <=200 mg/dL    HDL cholesterol 38 (L) >=40 mg/dL    LDL cholesterol, calculated 47 0 - 130 mg/dL    Cholesterol/HDL Ratio 3.0     Non-HDL Calculation 77 <160 mg/dL   Hemoglobin J8r    Collection Time: 10/10/23 10:51 AM   Result Value Ref Range    HEMOGLOBIN A1C % (INT/EXT) 5.7 (H) <5.6 %   Comprehensive metabolic panel    Collection Time: 10/10/23 10:51 AM   Result Value Ref Range    Sodium 136 135 - 146 mmol/L    Potassium 3.8 3.6 - 5.2 mmol/L    Chloride 105 98 - 110 mmol/L    CO2 (Bicarbonate) 31 20 - 32 mmol/L    Anion Gap <1 (L) 3 - 14 mmol/L    BUN 13 6 - 24 mg/dL    Creatinine 8.99 9.44 - 1.30 mg/dL    eGFRcr 75 >=39 fO/fpw/8.26f*7    Glucose 88 70 - 139 mg/dL    Fasting? Unknown     Calcium 9.4 8.5 - 10.5 mg/dL    AST 24 22 - 42 U/L    ALT <20 0 - 55 U/L    Alkaline phosphatase 86 30 - 130 U/L    Protein, total 6.8 6.0 - 8.4 g/dL    Albumin 3.3 3.2 - 5.0 g/dL    Bilirubin, total 0.7 0.5 - 1.2 mg/dL   CBC w/ Differential    Collection Time: 10/10/23 10:51 AM   Result Value Ref Range    WBC 5.1 4.0 - 11.0 K/uL    RBC 4.33 4.20 - 5.90 M/uL    Hemoglobin 12.0 (L) 13.0 - 17.5 g/dL    Hematocrit 63.0 (L) 37.0 - 53.0 %    MCV 85.2 80.0 - 100.0 fL    MCH 27.7 26.0 - 34.0 pg    MCHC 32.5 31.0 - 37.0 g/dL    RDW-CV 86.0 88.4 - 85.4 %    RDW-SD 43.4 35.0 - 51.0 fL    Platelets 174 150 - 400 K/uL    MPV 10.4 9.1 - 12.4 fL    Neutrophil % 52.4 %    Lymphocyte % 29.4 %    Monocytes % 12.5 %    Eosinophils % 5.3 %    Basophils % 0.2 %    Immature Granulocytes % 0.2 %    NRBC % 0.0 0.0 - 0.0 %    Neutrophils Absolute  2.67 1.50 - 7.95 K/uL    Lymphocytes Absolute 1.50 0.70 - 4.00 K/uL    Monocytes Absolute 0.64 0.38 - 0.83 K/uL    Eosinophils Absolute 0.27 0.00 - 0.50 K/uL    Basophils Absolute 0.01 0.00 - 0.22 K/uL    Immature Granulocytes Absolute 0.01 0.00 - 0.10 K/uL    NRBC Absolute 0.00 0.00 - 2.00 K/uL   Iron + transferrin + TIBC    Collection Time: 10/10/23 11:54 AM   Result Value Ref Range    Iron 47 30 - 180 ug/dL    TIBC 670 746 - 536 ug/dL    Transferrin 764 818 - 331 mg/dL    Iron Saturation 14 10 - 50 %   Ferritin    Collection Time: 10/10/23 11:54 AM   Result Value Ref Range    Ferritin 43.2 22.0 - 336.0 ng/mL   ToxAssure Flex 15, Urine    Collection Time: 10/10/23 12:44 PM   Result Value Ref Range    EXT LC SUM RPT UR FINAL  Creatinine, Urine 72 >=20 mg/dL    Amphetamines IA, UR Negative CUTOFF:300 ng/mL    Benzodiazepines, Ur +POSITIVE+     Diazepam, Ur Not Detected ng/mg creat    Desmethyldiazepam, Ur Not Detected ng/mg creat    Oxazepam, Ur Not Detected ng/mg creat    Temazepam, Ur Not Detected ng/mg creat    Alprazolam, Ur Not Detected ng/mg creat    Alpha-Hydroxyalprazolam, Ur Not Detected ng/mg creat    Desalkylflurazepam, Ur Not Detected ng/mg creat    Lorazepam , Ur 556 ng/mg creat    Alpha-Hydroxytriazolam, Ur Not Detected ng/mg creat    Clonazepam, Ur Not Detected ng/mg creat    7-Aminoclonazepam, Ur Not Detected ng/mg creat    Midazolam, Ur Not Detected ng/mg creat    Alpha-Hydroxymidazolam, Ur Not Detected ng/mg creat    Flunitrazepam, Ur Not Detected ng/mg creat    Desmethylflunitrazepam, Ur Not Detected ng/mg creat    Cocaine Metabolite IA, Ur Negative CUTOFF:150 ng/mL    Ethyl Alcohol Enzymatic, Ur Negative CUTOFF:0.020 g/dL    Cannabinoids IA, Ur  CUTOFF:20 ng/mL    6-Acetylmorphine IA, Ur Negative CUTOFF:10 ng/mL    Opiate Class IA, Ur Negative CUTOFF:100 ng/mL    Oxycodone Class IA, Ur Negative CUTOFF:100 ng/mL    Methadone IA, Ur Negative CUTOFF:100 ng/mL    Methadone Mtb IA, Ur Negative  CUTOFF:100 ng/mL    Buprenorphine, Ur Negative     Buprenorphine, Ur Not Detected ng/mg creat    Norbuprenorphine, Ur Not Detected ng/mg creat    Fentanyl, Ur Negative     Fentanyl, Ur Not Detected ng/mg creat    Norfentanyl, Ur Not Detected ng/mg creat    Tapentadol, IA, Ur Negative CUTOFF:200 ng/mL    Tramadol IA, Ur Negative CUTOFF:200 ng/mL    Barbiturates IA, Ur Negative CUTOFF:200 ng/mL    Phencyclidine IA, Ur Negative CUTOFF:25 ng/mL   Cannabinoids, MS, Ur    Collection Time: 10/10/23 12:44 PM   Result Value Ref Range    Cannabinoids Confirm, Ur +POSITIVE+     Carboxy-THC, Ur 315 ng/mg creat       Imaging Reviewed:      Pathology Reviewed:  No results found for this or any previous visit.    Assessment & Plan:  1. 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 History of radiation therapy    3. History of cigarette smoking    4. Anemia due to other cause, not classified    5. Subareolar mass of left breast    6. Gynecomastia        Dois is an 83 year old male with a history of squamous cell carcinoma of the left upper lobe who has had previous treatment with SBRT in [place] .  The Patient has moved to [place] from [place] .      A recent follow-up chest CT scan on 10/11/2023 shows interval decrease in the size of scattered right pulmonary nodules consistent with a treatment response.  There was no significant change in the appearance of the left suprahilar area which is the site of the patient's previously diagnosed bronchogenic carcinoma.  Given the stability of the patient's recent CT scan I have suggested to the patient that he have a follow-up CT scan in 6 months.  The patient has left breast tenderness in the subareolar area with nodularity and a questionable mass.  He was sent for diagnostic mammogram and left breast ultrasound. Findings on mammogram are consistent with probable benign gynecomastia, nodular type.  Six-month follow-up mammogram  was recommended.  Patient's CBC shows a mild anemia  with hemoglobin of 12 g/dL and hematocrit of 63.0.  White blood cell count/differential and platelet count are within normal range. Iron studies are normal.  I have ordered a reticulocyte count, B12 and TSHr. Reticulocyte count was normal at 2.0 and a repeat CBC showed a hemoglobin of 13 with a hematocrit of 39.8.  Total time spent on today's visit was 30 minutes.

## 2023-10-15 NOTE — Telephone Encounter (Signed)
 ----- Message from Dr. Norleen Candle, MD sent at 10/13/2023  7:13 AM EDT -----  Regarding: labs  Lab Results       Component                Value               Date                       WBC                      5.1                 10/10/2023                 HGB                      12.0 (L)            10/10/2023                 HCT                      36.9 (L)            10/10/2023                 MCV                      85.2                10/10/2023                 PLT                      174                 10/10/2023            Lab Results       Component                Value               Date                       IRON                     47                  10/10/2023                 TIBC                     329                 10/10/2023                 FERRITIN                 43.2                10/10/2023             He sees Dr Pennachio for lung cancer   Have Dr Lovella review his anemia    He seems to point toward low iron   But not definitive   He wants  no further endoscopies

## 2023-10-15 NOTE — Telephone Encounter (Signed)
 L breast US /Mammogram   STAT 10/15/23 12:30 pm    Hold for results

## 2023-10-16 ENCOUNTER — Ambulatory Visit: Admit: 2023-10-16 | Payer: MEDICARE | Primary: Internal Medicine

## 2023-10-16 DIAGNOSIS — D649 Anemia, unspecified: Principal | ICD-10-CM

## 2023-10-16 LAB — VITAMIN B12 WITH REFLEX TO MMA: Vitamin B-12: 227 pg/mL (ref 193–986)

## 2023-10-16 NOTE — Telephone Encounter (Signed)
 David from our lab called back and said he will try to contact [facility] to get this added on.

## 2023-10-16 NOTE — Telephone Encounter (Signed)
 Can we add a MMA to the vitamin b12? Order is in.

## 2023-10-16 NOTE — Telephone Encounter (Signed)
 Called and spoke w/ Anette in our lab who states they cannot add this on.

## 2023-10-18 NOTE — Telephone Encounter (Signed)
 It appears to be pending, please check status tomorrow.

## 2023-10-19 NOTE — Telephone Encounter (Signed)
 Still pending

## 2023-10-22 LAB — METHYLMALONIC ACID, SERUM: Methylmalonic Acid, S: 338 nmol/L (ref 0–378)

## 2023-10-22 NOTE — Telephone Encounter (Signed)
 Still pending, called our lab. If not resulted by tomorrow please contact send outs.

## 2023-10-23 NOTE — Telephone Encounter (Signed)
 MMA from 10/15/2023 is resulted for review

## 2023-12-03 MED ORDER — LORAZEPAM 1 MG TABLET
1 | ORAL_TABLET | Freq: Two times a day (BID) | ORAL | 0 refills | 8.50000 days | Status: DC | PRN
Start: 2023-12-03 — End: 2024-02-05

## 2023-12-03 NOTE — Telephone Encounter (Signed)
 Department Name: [address]    Patient: Justin Navarro  MRN: [id number]  Agent: Dufm Retort [facility] Scheduling Message    Patient called to request a refill of:  LORazepam  (Ativan ) 1 mg tablet     The patient is out of his medication.      Pharmacy:  [id number] - [place],  - [address]    Informed patient it can take 3 business days to send prescription refill to pharmacy.    Call back number: [phone]

## 2023-12-03 NOTE — Telephone Encounter (Signed)
 PT OUT OF MED    Last OV with PCP: 10/10/2023    Next OV with PCP: 02/20/2024    Last HGBA1C:   HEMOGLOBIN A1C % (INT/EXT) (%)   Date Value   10/10/2023 5.7 (H)       Last BP:   BP Readings from Last 3 Encounters:   10/15/23 106/67   10/10/23 120/68   07/25/23 128/78       TSH:   TSH   Date Value Ref Range Status   10/15/2023 0.63 0.27 - 4.94 uIU/mL Final     Comment:     New reference interval as of 02/03/2021.       Last TOX screen (if applicable):10/10/23    Labs    Lab Results   Component Value Date    CALCIUM 9.3 10/15/2023    ALBUMIN 3.5 10/15/2023    NA 135 10/15/2023    K 3.9 10/15/2023    CO2 32 10/15/2023    CL 105 10/15/2023    BUN 12 10/15/2023    CREATININE 1.10 10/15/2023      AST   Date Value Ref Range Status   10/15/2023 22 22 - 42 U/L Final   10/10/2023 24 22 - 42 U/L Final     ALT   Date Value Ref Range Status   10/15/2023 <20 0 - 55 U/L Final   10/10/2023 <20 0 - 55 U/L Final     No components found for: TBILI  Hemoglobin   Date Value Ref Range Status   10/15/2023 13.0 13.0 - 17.5 g/dL Final     Hematocrit   Date Value Ref Range Status   10/15/2023 39.8 37.0 - 53.0 % Final     Platelets   Date Value Ref Range Status   10/15/2023 193 150 - 400 K/uL Final     No results found for: PTT  No results found for: INR  No results found for: PROTIME  No results found for: PTADJUSTED

## 2023-12-25 NOTE — Telephone Encounter (Signed)
 Last OV with PCP: 10/10/2023  Last refill picked up  pharmacy request  Next OV with PCP: 02/20/2024    Last HGBA1C:   HEMOGLOBIN A1C % (INT/EXT) (%)   Date Value   10/10/2023 5.7 (H)       Last BP:   BP Readings from Last 3 Encounters:   10/15/23 106/67   10/10/23 120/68   07/25/23 128/78       TSH:   TSH   Date Value Ref Range Status   10/15/2023 0.63 0.27 - 4.94 uIU/mL Final     Comment:     New reference interval as of 02/03/2021.       Last TOX screen (if applicable):    Labs    Lab Results   Component Value Date    CALCIUM 9.3 10/15/2023    ALBUMIN 3.5 10/15/2023    NA 135 10/15/2023    K 3.9 10/15/2023    CO2 32 10/15/2023    CL 105 10/15/2023    BUN 12 10/15/2023    CREATININE 1.10 10/15/2023      AST   Date Value Ref Range Status   10/15/2023 22 22 - 42 U/L Final   10/10/2023 24 22 - 42 U/L Final     ALT   Date Value Ref Range Status   10/15/2023 <20 0 - 55 U/L Final   10/10/2023 <20 0 - 55 U/L Final     No components found for: TBILI  Hemoglobin   Date Value Ref Range Status   10/15/2023 13.0 13.0 - 17.5 g/dL Final     Hematocrit   Date Value Ref Range Status   10/15/2023 39.8 37.0 - 53.0 % Final     Platelets   Date Value Ref Range Status   10/15/2023 193 150 - 400 K/uL Final     No results found for: PTT  No results found for: INR  No results found for: PROTIME  No results found for: PTADJUSTED

## 2024-01-04 NOTE — Telephone Encounter (Signed)
 For 2 weeks having chest congestion, he is breathing ok, no c/o fever.  No SOB.    Told him there are no appts today, he declines going to another office or UC.  He wants an appt monday.  Advised he shouldn't wait but he says he is ok waiting.  Advised if his sxs worsen should go to an UC over the weekend.      Appt monday with Justin Navarro.

## 2024-01-04 NOTE — Telephone Encounter (Signed)
 Patient would like a antibiotic prescribed .  Requesting a call back from nurse to discuss  # [phone]

## 2024-01-07 ENCOUNTER — Ambulatory Visit: Admit: 2024-01-07 | Discharge: 2024-01-07 | Payer: MEDICARE | Primary: Internal Medicine

## 2024-01-07 MED ORDER — DOXYCYCLINE HYCLATE 100 MG CAPSULE
100 | ORAL_CAPSULE | Freq: Two times a day (BID) | ORAL | 0 refills | 7.00000 days | Status: AC
Start: 2024-01-07 — End: 2024-01-17

## 2024-01-07 MED ORDER — PREDNISONE 10 MG TABLET
10 | ORAL_TABLET | ORAL | 0 refills | 30.00000 days | Status: AC
Start: 2024-01-07 — End: 2024-01-17

## 2024-01-07 NOTE — Assessment & Plan Note (Addendum)
 RX for Doxycyline 100 mg BID sent  RX for Prednisone  taper sent x5 days for COPD exacerbation  Could use Mucinex or other sputum expectorant  Encouraged patient to use inhalers as directed and for SOB  Chest Xray deferred for now as pt afebrile, but if symptoms do not improve despite abx and steroid taper, will obtain cxr

## 2024-01-07 NOTE — Progress Notes (Signed)
 [facility]  [address]  [place] [address]  Dept: [phone]  Dept Fax: [phone]     Patient ID: Justin Navarro is a 83 y.o. male who presents for Flu Symptoms (X 2 weeks cough congestion /Allergic to PCN).    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 - reports trellegy is too expensive so has not been using it everyday  Has not needed Albuterol  inhaler at home - he reports only takes it if his 02 sat is low at home  Denies fever/chills  Had some leftover Doxycyline so took 100 mg daily for last x3 days and reports some improvement since taking it     BLE Edema  Pt seen in office 05/2023 for BLE edema  Was prescribed Torsemide  5 mg PRN   Reports he has been taking it daily with good effect  Reports when he elevates his legs - edema dissipates and when he wakes up in the AM he has no LE edema    Current Outpatient Medications   Medication Instructions    albuterol  90 mcg/actuation inhaler 2 puffs, Every 4 hours PRN    atorvastatin  (LIPITOR ) 40 mg, oral, Daily    celecoxib  (CELEBREX ) 200 mg, oral, 2 times daily    doxycycline  (VIBRAMYCIN ) 100 mg, oral, 2 times daily, Take with at least 8 ounces (large glass) of water, do not lie down for 30 minutes after    fluticasone -umeclidin-vilanter (Trelegy Ellipta ) 200-62.5-25 mcg blister with device 1 Inhalation, inhalation, Daily, Rinse mouth after taking it.    levothyroxine  (SYNTHROID , LEVOXYL ) 112 mcg, oral, Daily    LORazepam  (ATIVAN ) 1 mg, oral, 2 times daily PRN    mirtazapine  (REMERON ) 15 mg, oral, Nightly    oxybutynin  XL (DITROPAN -XL) 5 mg, oral, Daily, Do not crush, chew, or split    pantoprazole  (PROTONIX ) 40 mg, oral, Daily before breakfast, Do not crush, chew, or split.     predniSONE  (Deltasone ) 10 mg tablet Take 5 tablets (50 mg) by mouth once daily for 2 days, THEN 4 tablets (40 mg) once daily for 2 days, THEN 3 tablets (30 mg) once daily for 2 days, THEN 2 tablets (20 mg) once daily for 2 days, THEN 1 tablet (10 mg) once daily for 2 days.    tamsulosin (FLOMAX) 0.4 mg, Daily    torsemide  (DEMADEX ) 10-15 mg, oral, Daily PRN     Allergies[1]  Medical History[2]  Surgical History[3]    Objective  Visit Vitals  BP 120/70 (BP Location: Left arm, Patient Position: Sitting, BP Cuff Size: Adult)   Pulse 84   Temp 36.6 C (97.8 F) (Temporal)   Ht 1.803 m   Wt 103.9 kg   SpO2 96%   BMI 31.94 kg/m   BSA 2.28 m     Physical Exam  Constitutional:       Appearance: Normal appearance.   HENT:      Mouth/Throat:      Mouth: Mucous membranes are dry.      Pharynx: Oropharynx is clear. No oropharyngeal exudate or posterior oropharyngeal erythema.   Eyes:      General:         Right eye: No discharge.  Left eye: No discharge.      Extraocular Movements: Extraocular movements intact.      Conjunctiva/sclera: Conjunctivae normal.      Pupils: Pupils are equal, round, and reactive to light.   Cardiovascular:      Rate and Rhythm: Normal rate and regular rhythm.      Pulses: Normal pulses.      Heart sounds: Normal heart sounds. No murmur heard.     No friction rub. No gallop.   Pulmonary:      Effort: Pulmonary effort is normal. No respiratory distress.      Breath sounds: No stridor. Wheezing present. No rhonchi or rales.      Comments: Anterior Chest - slight expiratory wheezing  Posterior chest - Lung sounds clear in all fields  Chest:      Chest wall: No tenderness.   Musculoskeletal:         General: Normal range of motion.      Cervical back: Normal range of motion and neck supple. No rigidity or tenderness.      Right lower leg: Edema present.      Left lower leg: Edema present.      Comments: +2 edema in BLE   Lymphadenopathy:      Cervical: No cervical adenopathy.   Skin:     General:  Skin is warm and dry.      Capillary Refill: Capillary refill takes less than 2 seconds.      Coloration: Skin is not jaundiced or pale.      Findings: No bruising, erythema, lesion or rash.   Neurological:      General: No focal deficit present.      Mental Status: He is alert and oriented to person, place, and time. Mental status is at baseline.   Psychiatric:         Mood and Affect: Mood normal.         Behavior: Behavior normal.         Thought Content: Thought content normal.         Assessment / Plan  Grove was seen today for flu symptoms.  Centrilobular emphysema  Assessment & Plan:  RX for Doxycyline 100 mg BID sent  RX for Prednisone  taper sent x5 days for COPD exacerbation  Could use Mucinex or other sputum expectorant  Encouraged patient to use inhalers as directed and for SOB  Chest Xray deferred for now as pt afebrile, but if symptoms do not improve despite abx and steroid taper, will obtain cxr  Orders:  -     doxycycline  (Vibramycin ) 100 mg capsule; Take 1 capsule (100 mg) by mouth in the morning and 1 capsule (100 mg) before bedtime. Do all this for 10 days. Take with at least 8 ounces (large glass) of water, do not lie down for 30 minutes after.  -     predniSONE  (Deltasone ) 10 mg tablet; Take 5 tablets (50 mg) by mouth once daily for 2 days, THEN 4 tablets (40 mg) once daily for 2 days, THEN 3 tablets (30 mg) once daily for 2 days, THEN 2 tablets (20 mg) once daily for 2 days, THEN 1 tablet (10 mg) once daily for 2 days.  Acute cough  Comments:  Will treat as high risk for decompensation with Centrilobular Emphysema  See above  Pt aware can use Albuterol  inhaler PRN to assist with cough as well  Orders:  -     doxycycline  (Vibramycin ) 100 mg capsule; Take  1 capsule (100 mg) by mouth in the morning and 1 capsule (100 mg) before bedtime. Do all this for 10 days. Take with at least 8 ounces (large glass) of water, do not lie down for 30 minutes after.  -     predniSONE  (Deltasone ) 10 mg tablet; Take  5 tablets (50 mg) by mouth once daily for 2 days, THEN 4 tablets (40 mg) once daily for 2 days, THEN 3 tablets (30 mg) once daily for 2 days, THEN 2 tablets (20 mg) once daily for 2 days, THEN 1 tablet (10 mg) once daily for 2 days.  Edema, unspecified type  Comments:  Pt reports improvement with Torsemide  5 mg - he takes daily  Per Dr.Mudrock note - need BMP rechecked  Pt made aware order in and can go get redraw at [facility]        If symptoms worsen, breathing worsens or fever develops despite treatment please go to Emergency room    Waddell Fuse, NP                              [1]   Allergies  Allergen Reactions    Belsomra [Suvorexant] Other    Iodinated Contrast Media Hives    Penicillin G Hives   [2] No past medical history on file.  [3] No past surgical history on file.

## 2024-01-08 LAB — BASIC METABOLIC PANEL
Anion Gap: 7 mmol/L — ABNORMAL LOW (ref 10.0–18.0)
BUN/Creat Ratio: 14 (ref 10–24)
BUN: 16 mg/dL (ref 8–27)
Calcium: 9.8 mg/dL (ref 8.6–10.2)
Carbon Dioxide: 32 mmol/L — ABNORMAL HIGH (ref 20–29)
Chloride: 105 mmol/L (ref 96–106)
Creat: 1.16 mg/dL (ref 0.76–1.27)
Glucose: 95 mg/dL (ref 70–99)
Potassium: 4.7 mmol/L (ref 3.5–5.2)
Sodium: 144 mmol/L (ref 134–144)
eGFR: 63 mL/min/1.73 (ref >59)

## 2024-01-08 NOTE — Telephone Encounter (Signed)
 Informed pt of recent lab results  chemistry all stable  Received: Today  Norleen Candle, MD  Justin Navarro, Justin Navarro      No interventions necessary    Lab Results       Component                Value               Date                       GLUCOSE                  95                  01/07/2024                 CALCIUM                  9.8                 01/07/2024                 NA                       144                 01/07/2024                 K                        4.7                 01/07/2024                 CO2                      32 (H)              01/07/2024                 CL                       105                 01/07/2024                 BUN                      16                  01/07/2024                 CREATININE               1.16                01/07/2024

## 2024-01-21 MED ORDER — ATORVASTATIN 40 MG TABLET
40 | ORAL_TABLET | Freq: Every day | ORAL | 3 refills | 90.00000 days | Status: AC
Start: 2024-01-21 — End: 2025-01-20

## 2024-01-21 NOTE — Telephone Encounter (Signed)
 Appointment:        Date of patient's next encounter in the current department:  07/21/2024  Date of patient's next encounter with the current provider:  07/21/2024  Date of patient's last encounter in the current department: 07/25/2023

## 2024-02-05 NOTE — Telephone Encounter (Signed)
 Last OV with PCP: 10/10/2023 pt seen by Waddell on 12/08    Next OV with PCP: 02/20/2024    Last HGBA1C:   HEMOGLOBIN A1C % (INT/EXT) (%)   Date Value   10/10/2023 5.7 (H)       Last BP:   BP Readings from Last 3 Encounters:   01/07/24 120/70   10/15/23 106/67   10/10/23 120/68       TSH:   TSH   Date Value Ref Range Status   10/15/2023 0.63 0.27 - 4.94 uIU/mL Final     Comment:     New reference interval as of 02/03/2021.       Last TOX screen (if applicable):    Labs    Lab Results   Component Value Date    CALCIUM 9.8 01/07/2024    ALBUMIN 3.5 10/15/2023    NA 144 01/07/2024    K 4.7 01/07/2024    CO2 32 (H) 01/07/2024    CL 105 01/07/2024    BUN 16 01/07/2024    CREATININE 1.16 01/07/2024      AST   Date Value Ref Range Status   10/15/2023 22 22 - 42 U/L Final   10/10/2023 24 22 - 42 U/L Final     ALT   Date Value Ref Range Status   10/15/2023 <20 0 - 55 U/L Final   10/10/2023 <20 0 - 55 U/L Final     No components found for: TBILI  Hemoglobin   Date Value Ref Range Status   10/15/2023 13.0 13.0 - 17.5 g/dL Final     Hematocrit   Date Value Ref Range Status   10/15/2023 39.8 37.0 - 53.0 % Final     Platelets   Date Value Ref Range Status   10/15/2023 193 150 - 400 K/uL Final     No results found for: PTT  No results found for: INR  No results found for: PROTIME  No results found for: PTADJUSTED

## 2024-02-20 ENCOUNTER — Ambulatory Visit: Admit: 2024-02-20 | Discharge: 2024-02-20 | Payer: MEDICARE | Attending: Internal Medicine | Primary: Internal Medicine

## 2024-02-20 LAB — POCT HGB A1C: POCT Hemoglobin A1C: 6 % — AB (ref ?–5.7)

## 2024-02-20 MED ORDER — TADALAFIL 20 MG TABLET
20 | ORAL_TABLET | Freq: Every day | ORAL | 3 refills | Status: AC | PRN
Start: 2024-02-20 — End: ?

## 2024-02-20 NOTE — Assessment & Plan Note (Signed)
 Wt stable at this time

## 2024-02-20 NOTE — Assessment & Plan Note (Signed)
 Very interested in Rx of cialis    Warning side effect of headaches

## 2024-02-20 NOTE — Assessment & Plan Note (Signed)
 Refuses labs today.

## 2024-02-20 NOTE — Progress Notes (Signed)
 [facility] [place]  [address]  [place] [address]  Dept: [phone]  Dept Fax: [phone]     Patient ID: Justin Navarro is a 84 y.o. male who presents for Follow-up.    Subjective   HPI    Patient lives in assisted living   In [place], [place]    Wt-233 pounds    Wt in 10-2023  233 pounds     No change     He refuses all vaccines     He is seeing Dr Justin Navarro for lung cancer   Last CT in 10-2023    IMPRESSION:  1.  Interval decrease in size of scattered small right pulmonary nodules, nonspecific but could represent treatment response.    2.  The treated left suprahilar known primary bronchogenic carcinoma appears otherwise similar to prior.  3.  Other chronic findings as above.      Now he says he wants to stop all cancer care         Wants no labs             Patient Active Problem List   Diagnosis    Testicular hypofunction    Hx of adenomatous colonic polyps    Chronic obstructive pulmonary disease, unspecified    Erectile dysfunction    GERD (gastroesophageal reflux disease)    History of gout    History of malignant neoplasm of skin    Mixed hyperlipidemia    Hypothyroidism    History of oral cancer    Other abnormal glucose    Actinic keratosis    Vitiligo    Chronic anxiety    Squamous cell lung cancer, left    History of bilateral cataract extraction    History of radiation therapy    History of cholecystectomy    History of ankle surgery    History of appendectomy    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 calories with serious comorbidity and body mass index (BMI) of 32.0 to 32.9 in adult    PAC (premature atrial contraction)    HI (head injury)    Steroid dependence    Vitamin D  deficiency    Prediabetes    Marijuana use    Urge incontinence of urine    Venous insufficiency    History of cellulitis    History of lung cancer    Lower urinary tract symptoms (LUTS)    Normocytic anemia    Edema    Erectile  dysfunction due to arterial insufficiency     Allergies   Allergen Reactions    Belsomra [Suvorexant] Other    Iodinated Contrast Media Hives    Penicillin G Hives     No past medical history on file.  No past surgical history on file.  Family History   Problem Relation Name Age of Onset    Breast cancer Mother Justin Navarro 59        passed away @ 27    Other (Oropharyngeal Cancer) Father Justin Navarro 10        Died age 4     Social History     Socioeconomic History    Marital status: Divorced     Spouse name: Not on file    Number of children: Not on file    Years of education: Not on file    Highest education level: Not on file   Occupational History  Not on file   Tobacco Use    Smoking status: Former     Types: Cigarettes     Start date: 01/31/1956    Smokeless tobacco: Never   Vaping Use    Vaping status: Never Used   Substance and Sexual Activity    Alcohol use: Not Currently     Comment: Quit alcohol use in 1995    Drug use: Yes     Types: Marijuana    Sexual activity: Not on file   Other Topics Concern    Not on file   Social History Narrative    Not on file     Social Determinants of Health     Financial Resource Strain: Low Risk (02/20/2024)    Overall Financial Resource Strain (CARDIA)     Difficulty of Paying Living Expenses: Not hard at all   Food Insecurity: Unknown (02/20/2024)    Hunger Vital Sign     Worried About Running Out of Food in the Last Year: Never true     Ran Out of Food in the Last Year: Not on file   Transportation Needs: No Transportation Needs (02/20/2024)    PRAPARE - Therapist, Art (Medical): No     Lack of Transportation (Non-Medical): No   Physical Activity: Not on file   Stress: Not on file   Social Connections: Not on file   Intimate Partner Violence: Not on file   Housing Stability: Unknown (02/20/2024)    Housing Stability Vital Sign     Unable to Pay for Housing in the Last Year: Not on file     Number of Times Moved in the Last Year: Not on file     Homeless in the  Last Year: No          Objective   Visit Vitals  BP 110/70 (BP Location: Right arm, Patient Position: Sitting, BP Cuff Size: Large adult)   Pulse 82   Temp 36.5 C (97.7 F) (Temporal)   Ht 1.803 m   Wt 105.7 kg   SpO2 96%   BMI 32.50 kg/m   BSA 2.3 m     Physical Exam  Vitals reviewed.   Constitutional:       Appearance: Normal appearance. He is obese.   HENT:      Head: Normocephalic and atraumatic.      Right Ear: Tympanic membrane, ear canal and external ear normal.      Left Ear: Tympanic membrane, ear canal and external ear normal.      Nose: Nose normal.      Mouth/Throat:      Mouth: Mucous membranes are moist.      Pharynx: Oropharynx is clear.   Eyes:      Extraocular Movements: Extraocular movements intact.      Conjunctiva/sclera: Conjunctivae normal.      Pupils: Pupils are equal, round, and reactive to light.      Comments: Hx of bilateral cataract extractions   Cardiovascular:      Rate and Rhythm: Normal rate and regular rhythm.      Pulses: Normal pulses.      Heart sounds: Normal heart sounds.   Pulmonary:      Effort: Pulmonary effort is normal.      Breath sounds: Normal breath sounds.      Comments:   Hx of lung cancer   And elements of COPD  Abdominal:      General: Abdomen is  flat. Bowel sounds are normal.   Musculoskeletal:         General: Tenderness present.      Cervical back: Normal range of motion and neck supple.      Comments: Walks with cane   Very slow to get up from seating due to   Lumbar spondylosis   Skin:     General: Skin is warm and dry.      Comments: Fair -needs to see derm annually    Neurological:      General: No focal deficit present.      Mental Status: He is alert. Mental status is at baseline.   Psychiatric:         Mood and Affect: Mood normal.         Behavior: Behavior normal.         Thought Content: Thought content normal.         Judgment: Judgment normal.         Assessment/Plan   Justin Navarro was seen today for follow-up.  Erectile dysfunction due to arterial  insufficiency  Assessment & Plan:    Very interested in Rx of cialis    Warning side effect of headaches  Orders:  -     tadalafil  20 mg tablet; Take 0.5-1 tablets (10-20 mg) by mouth if needed each day for erectile dysfunction.  Prediabetes  -     POCT glycosylated hemoglobin (Hb A1C)  Centrilobular emphysema  Assessment & Plan:    Not really compliant with Trelegy   Has albuterol  HFA at home also  Oxygen sat is 96%  Squamous cell lung cancer, left  Assessment & Plan:    Tells me he is going to stop all treatments     But not sure if he means what he says   He should be doing visits with Dr Lovella   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 calories with serious comorbidity and body mass index (BMI) of 32.0 to 32.9 in adult  Assessment & Plan:    Wt stable at this time   Normocytic anemia  Assessment & Plan:    Refuses labs today  Chronic anxiety  Assessment & Plan:   Has contract for lorazepam 

## 2024-02-20 NOTE — Assessment & Plan Note (Signed)
 Not really compliant with Trelegy   Has albuterol  HFA at home also  Oxygen sat is 96%

## 2024-02-20 NOTE — Assessment & Plan Note (Signed)
 Tells me he is going to stop all treatments     But not sure if he means what he says   He should be doing visits with Dr Pennachio

## 2024-02-20 NOTE — Assessment & Plan Note (Signed)
 Has contract for lorazepam 

## 2024-03-04 ENCOUNTER — Ambulatory Visit
Admit: 2024-03-04 | Discharge: 2024-03-04 | Payer: MEDICARE | Attending: Student in an Organized Health Care Education/Training Program | Primary: Internal Medicine

## 2024-03-04 DIAGNOSIS — L570 Actinic keratosis: Principal | ICD-10-CM

## 2024-03-04 NOTE — Progress Notes (Signed)
 [facility] Department of Dermatology  New Patient   03/04/2024    Past Derm History  Specialty Problems          Dermatology Problems    Actinic keratosis    Vitiligo    Squamous cell lung cancer, left       Dickson Kostelnik  is a 84 y.o. y.o. male who presents for a new patient visit.    HPI  History of Present Illness  The patient is an 84 year old male who presents for evaluation of a spot on his eyebrow and itching.    He has been observing a spot on his eyebrow for several years, which he reports has been progressively enlarging. He has a history of skin cancer, with previous lesions located on his arm, face, and the back of his neck. 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 He resides in an assisted living facility and reports minimal sun exposure. He has previously undergone cryotherapy for similar lesions.    He also reports experiencing generalized itching at night, mostly on the back, a symptom that has been present for the past 6 months. He has made changes to his personal care products, including his shampoo, mattress, and laundry detergent.    PAST SURGICAL HISTORY:  NMSC excision on arm, right cheek, and back of the neck. Most recently in 2024      ROS  Patient feels well. No other skin complaints. No other systemic symptoms.    Medical History  Medical History[1]    Surgical History[2]    Medications  Current, updated medication list as of end of today's visit:  Current Outpatient Medications   Medication Instructions    albuterol  90 mcg/actuation inhaler 2 puffs, Every 4 hours PRN    atorvastatin  (LIPITOR ) 40 mg, oral, Daily    celecoxib  (CELEBREX ) 200 mg, oral, 2 times daily    fluticasone -umeclidin-vilanter (Trelegy Ellipta ) 200-62.5-25 mcg blister with device 1 Inhalation, inhalation, Daily, Rinse mouth after taking it.    levothyroxine  (SYNTHROID , LEVOXYL ) 112  mcg, oral, Daily    LORazepam  (ATIVAN ) 1 mg, oral, 2 times daily PRN    mirtazapine  (REMERON ) 15 mg, oral, Nightly    oxybutynin  XL (DITROPAN -XL) 5 mg, oral, Daily, Do not crush, chew, or split    pantoprazole  (PROTONIX ) 40 mg, oral, Daily before breakfast, Do not crush, chew, or split.    tadalafil  10-20 mg, oral, Daily PRN    tamsulosin (FLOMAX) 0.4 mg, Daily    torsemide  (DEMADEX ) 10-15 mg, oral, Daily PRN       Allergies  Allergies[3]    Physical Exam  Well appearing patient in no apparent distress; mood and affect are within normal limits. Skin exam was performed of the following and pertinent positives are included below:   Focused exam of the face per patient preference     Impressi     Skin Exam - Physical Exam, Assessment, Plan, & Procedures  Skin Exam  1. ACTINIC KERATOSIS (10)  Dorsum of Nose, Left Malar Cheek (2), Left Mid Helix, Left Parotid Area, Left Preauricular Area, Left Scaphoid Fossa, Right Buccal Cheek, Right Eyebrow, Right Preauricular Area  Erythematous patch(es) with dyskeratotic scale.   - Precancerous nature discussed and treatment options discussed.   - Cryotherapy - Dorsum of Nose, Left Malar Cheek (2), Left Mid Helix, Left Parotid Area, Left Preauricular Area, Left Scaphoid  Fossa, Right Buccal Cheek, Right Eyebrow, Right Preauricular Area    Number of cycles:  2  Complexity: simple    Procedure Note: Verbal consent obtained. Risk of pain, scar, recurrence, discoloration, blistering, infection discussed. Wound care instructions discussed.   This procedure was medically necessary because the following was true about the lesion that was treated:  Potentially capable of transformation into a malignant process.    2. XEROSIS CUTIS  Generalized  Dry skin noted.  Discussed emollients and gentle skin care practices.     - Over-the-counter Sarna lotion suggested for application on itchy areas to alleviate discomfort.  - Provided a sheet with instructions and recommendations for skin moisturizing  routine.    RTC 4 months     This visit note was drafted with the help of artificial intelligence. I obtained consent to record the visit from all participants for this purpose.    Treonna Klee Ben Lagha, MD       [1] History reviewed. No pertinent past medical history.  [2] History reviewed. No pertinent surgical history.  [3]   Allergies  Allergen Reactions    Belsomra [Suvorexant] Other    Iodinated Contrast Media Hives    Penicillin G Hives    Doxycycline       Other Reaction(s): PATIENT DOESN'T REMEMBER
